# Patient Record
Sex: Male | Born: 1935
Health system: Southern US, Community
[De-identification: ages and names within clinical notes are randomized; demographics above are authoritative.]

## PROBLEM LIST (undated history)

## (undated) DIAGNOSIS — C439 Malignant melanoma of skin, unspecified: Secondary | ICD-10-CM

## (undated) DIAGNOSIS — I509 Heart failure, unspecified: Secondary | ICD-10-CM

## (undated) DIAGNOSIS — I10 Essential (primary) hypertension: Secondary | ICD-10-CM

## (undated) DIAGNOSIS — A809 Acute poliomyelitis, unspecified: Secondary | ICD-10-CM

## (undated) DIAGNOSIS — D126 Benign neoplasm of colon, unspecified: Secondary | ICD-10-CM

## (undated) DIAGNOSIS — G14 Postpolio syndrome: Secondary | ICD-10-CM

## (undated) DIAGNOSIS — K219 Gastro-esophageal reflux disease without esophagitis: Secondary | ICD-10-CM

## (undated) DIAGNOSIS — I2692 Saddle embolus of pulmonary artery without acute cor pulmonale: Secondary | ICD-10-CM

## (undated) DIAGNOSIS — C61 Malignant neoplasm of prostate: Secondary | ICD-10-CM

## (undated) DIAGNOSIS — N189 Chronic kidney disease, unspecified: Secondary | ICD-10-CM

## (undated) DIAGNOSIS — M1990 Unspecified osteoarthritis, unspecified site: Secondary | ICD-10-CM

## (undated) DIAGNOSIS — E785 Hyperlipidemia, unspecified: Secondary | ICD-10-CM

## (undated) HISTORY — DX: Benign neoplasm of colon, unspecified: D12.6

## (undated) HISTORY — DX: Acute poliomyelitis, unspecified: A80.9

## (undated) HISTORY — PX: LUMBAR EPIDURAL INJECTION: SHX1980

## (undated) HISTORY — PX: OTHER SURGICAL HISTORY: SHX169

## (undated) HISTORY — DX: Hyperlipidemia, unspecified: E78.5

## (undated) HISTORY — DX: Malignant neoplasm of prostate: C61

## (undated) HISTORY — DX: Postpolio syndrome: G14

## (undated) HISTORY — PX: PROSTATECTOMY: SHX69

## (undated) HISTORY — PX: TONSILLECTOMY: SUR1361

---
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
## 1998-03-15 ENCOUNTER — Ambulatory Visit (HOSPITAL_COMMUNITY): Admission: RE | Admit: 1998-03-15 | Discharge: 1998-03-15 | Payer: Self-pay | Admitting: Family Medicine

## 2000-12-14 ENCOUNTER — Other Ambulatory Visit: Admission: RE | Admit: 2000-12-14 | Discharge: 2000-12-14 | Payer: Self-pay | Admitting: Internal Medicine

## 2000-12-14 ENCOUNTER — Encounter (INDEPENDENT_AMBULATORY_CARE_PROVIDER_SITE_OTHER): Payer: Self-pay | Admitting: Specialist

## 2000-12-24 ENCOUNTER — Encounter: Payer: Self-pay | Admitting: General Surgery

## 2000-12-24 ENCOUNTER — Ambulatory Visit (HOSPITAL_COMMUNITY): Admission: RE | Admit: 2000-12-24 | Discharge: 2000-12-24 | Payer: Self-pay | Admitting: General Surgery

## 2001-01-07 ENCOUNTER — Encounter (INDEPENDENT_AMBULATORY_CARE_PROVIDER_SITE_OTHER): Payer: Self-pay | Admitting: Specialist

## 2001-01-07 ENCOUNTER — Inpatient Hospital Stay (HOSPITAL_COMMUNITY): Admission: RE | Admit: 2001-01-07 | Discharge: 2001-01-11 | Payer: Self-pay | Admitting: General Surgery

## 2002-12-15 ENCOUNTER — Ambulatory Visit (HOSPITAL_COMMUNITY): Admission: RE | Admit: 2002-12-15 | Discharge: 2002-12-15 | Payer: Self-pay | Admitting: Urology

## 2003-03-27 ENCOUNTER — Ambulatory Visit: Admission: RE | Admit: 2003-03-27 | Discharge: 2003-05-30 | Payer: Self-pay | Admitting: Radiation Oncology

## 2003-06-28 ENCOUNTER — Ambulatory Visit: Admission: RE | Admit: 2003-06-28 | Discharge: 2003-06-28 | Payer: Self-pay | Admitting: Radiation Oncology

## 2003-10-26 ENCOUNTER — Encounter: Admission: RE | Admit: 2003-10-26 | Discharge: 2004-01-24 | Payer: Self-pay | Admitting: Internal Medicine

## 2007-11-22 ENCOUNTER — Encounter: Admission: RE | Admit: 2007-11-22 | Discharge: 2008-02-16 | Payer: Self-pay | Admitting: Internal Medicine

## 2007-12-15 ENCOUNTER — Ambulatory Visit: Payer: Self-pay | Admitting: Internal Medicine

## 2008-01-04 ENCOUNTER — Ambulatory Visit: Payer: Self-pay | Admitting: Internal Medicine

## 2008-01-04 ENCOUNTER — Encounter: Payer: Self-pay | Admitting: Internal Medicine

## 2008-01-06 ENCOUNTER — Encounter: Payer: Self-pay | Admitting: Internal Medicine

## 2009-03-18 ENCOUNTER — Encounter: Payer: Self-pay | Admitting: Cardiology

## 2010-03-20 NOTE — Procedures (Signed)
Summary: Colonoscopy   Colonoscopy  Procedure date:  01/04/2008  Findings:      Location:  [facility].    Procedures Next Due Date:    Colonoscopy: 01/2011  Patient Name: Eddie Sanders, Eddie Sanders. MRN:  Procedure Procedures: Colonoscopy CPT: [phone].    with polypectomy. CPT: [id number].  Personnel: Endoscopist: Wilhemina Bonito. Marina Goodell, MD.  Exam Location: Exam performed in Outpatient Clinic. Outpatient  Patient Consent: Procedure, Alternatives, Risks and Benefits discussed, consent obtained, from patient. Consent was obtained by the RN.  Indications  Surveillance of: Adenomatous Polyp(s). This is not an initial surveillance exam. Initial polypectomy was performed in 2002. Largest polyp removed was > 19 mm. Pathology of worst  polyp: tubulovillous adenoma. The patient has had surgery. Previous surveillance exam(s) in  2003, Previous surveillance exam(s) in  2005,  History  Current Medications: Patient is not currently taking Coumadin.  Pre-Exam Physical: Performed Jan 04, 2008. Cardio-pulmonary exam, Rectal exam, HEENT exam , Mental status exam WNL.  Comments: Pt. history reviewed/updated, physical exam performed prior to initiation of sedation?yes Exam Exam: Extent of exam reached: Cecum, extent intended: Cecum.  The cecum was identified by appendiceal orifice and IC valve. Patient position: on left side. Time to Cecum: 00:03:48. Time for Withdrawl: 00:13:49. Colon retroflexion performed. Images taken. ASA Classification: II. Tolerance: excellent.  Monitoring: Pulse and BP monitoring, Oximetry used. Supplemental O2 given.  Colon Prep Used Movi  for colon prep. Prep results: excellent.  Sedation Meds: Patient assessed and found to be appropriate for moderate (conscious) sedation. Fentanyl 25 mcg. given IV. Versed 4 mg. given IV.  Findings MULTIPLE POLYPS: Cecum to Transverse Colon. minimum size 2 mm, maximum size 5 mm. Procedure:  snare without cautery, removed,  Polyp retrieved, 5 polyps Polyps sent to pathology. ICD9: Colon Polyps: 211.3.  NORMAL EXAM: Cecum.  PRIOR SURGERY: Sigmoid Colon. Segmental Colectomy.  NORMAL EXAM: Rectum.   Assessment  Diagnoses: 211.3: Colon Polyps.  x 5.   Events  Unplanned Interventions: No intervention was required.  Unplanned Events: There were no complications. Plans Disposition: After procedure patient sent to recovery. After recovery patient sent home.  Scheduling/Referral: Colonoscopy, to Wilhemina Bonito. Marina Goodell, MD, in 3 years if medically fit,    cc: Rodrigo Ran, MD   REPORT OF SURGICAL PATHOLOGY   Case #: [id number] Patient Name: Eddie Sanders, Eddie Sanders. Office Chart Number:  [id number]   MRN: [id number] Pathologist: Beulah Gandy. Luisa Hart, MD DOB/Age  [birth year] (Age: 77)    Gender: M Date Taken:  01/04/2008 Date Received: 01/04/2008   FINAL DIAGNOSIS   ***MICROSCOPIC EXAMINATION AND DIAGNOSIS***   COLON, CECUM, ASCENDING AND TRANSVERSE POLYPS:  THREE TUBULAR ADENOMAS AND FRAGMENTS OF BENIGN COLONIC MUCOSA.  NO HIGH GRADE DYSPLASIA OR MALIGNANCY IDENTIFIED.   cc Date Reported:  01/05/2008     Beulah Gandy. Luisa Hart, MD   January 06, 2008 MRN: [id number]    [facility] [address] [place], [place]  [address]    Dear Eddie Sanders,  I am pleased to inform you that the colon polyp(s) removed during your recent colonoscopy was (were) found to be benign (no cancer detected) upon pathologic examination.  I recommend you have a repeat colonoscopy examination in 3 years to look for recurrent polyps, as having colon polyps increases your risk for having recurrent polyps or even colon cancer in the future.  Should you develop new or worsening symptoms of abdominal pain, bowel habit changes or bleeding from the rectum or bowels, please schedule an evaluation with either your primary  care physician or with me.  Additional information/recommendations:  _X_ No further action with gastroenterology is needed  at this time. Please      follow-up with your primary care physician for your other healthcare      needs.  Please call us if you are having persistent problems or have questions about your condition that have not been fully answered at this time.  Sincerely,  Hilarie Fredrickson MD  This report was created from the original endoscopy report, which was reviewed and signed by the above listed endoscopist.

## 2010-03-20 NOTE — Letter (Signed)
Summary: Patient Notice- Polyp Results  [facility]  [address] [place], [place] [address]   Phone: [phone]  Fax: [phone]        January 06, 2008 MRN: [id number]    [facility] [address] [place], [place]  [address]    Dear Eddie Sanders,  I am pleased to inform you that the colon polyp(s) removed during your recent colonoscopy was (were) found to be benign (no cancer detected) upon pathologic examination.  I recommend you have a repeat colonoscopy examination in 3 years to look for recurrent polyps, as having colon polyps increases your risk for having recurrent polyps or even colon cancer in the future.  Should you develop new or worsening symptoms of abdominal pain, bowel habit changes or bleeding from the rectum or bowels, please schedule an evaluation with either your primary care physician or with me.  Additional information/recommendations:  _X_ No further action with gastroenterology is needed at this time. Please      follow-up with your primary care physician for your other healthcare      needs.  Please call us if you are having persistent problems or have questions about your condition that have not been fully answered at this time.  Sincerely,  Hilarie Fredrickson MD  This letter has been electronically signed by your physician.

## 2010-03-20 NOTE — Letter (Signed)
Summary: [facility] No Show Notification  [facility] No Show Notification   Imported By: Roderic Ovens 04/02/2009 15:11:36  _____________________________________________________________________  External Attachment:    Type:   Image     Comment:   External Document

## 2010-03-20 NOTE — Miscellaneous (Signed)
Summary: GI PV  Clinical Lists Changes  Medications: Added new medication of MOVIPREP 100 GM  SOLR (PEG-KCL-NACL-NASULF-NA ASC-C) As per prep instructions. - Signed Rx of MOVIPREP 100 GM  SOLR (PEG-KCL-NACL-NASULF-NA ASC-C) As per prep instructions.;  #1 x 0;  Signed;  Entered by: Barton Fanny RN;  Authorized by: Hilarie Fredrickson MD;  Method used: Electronically to [facility]. #[id number]*, [address], [place], [place], [place]  [address], Ph: [phone], Fax: [phone] Allergies: Added new allergy or adverse reaction of PENICILLIN Observations: Added new observation of ALLERGY REV: Done (12/15/2007 14:08) Added new observation of NKA: F (12/15/2007 14:08)    Prescriptions: MOVIPREP 100 GM  SOLR (PEG-KCL-NACL-NASULF-NA ASC-C) As per prep instructions.  #1 x 0   Entered by:   Barton Fanny RN   Authorized by:   Hilarie Fredrickson MD   Signed by:   Barton Fanny RN on 12/15/2007   Method used:   Electronically to        [facility]. [phone]* (retail)       [address]       [place], [place]  [address]       Ph: [phone]       Fax: [phone]   RxID:   [id number]

## 2010-07-01 ENCOUNTER — Ambulatory Visit
Admission: RE | Admit: 2010-07-01 | Discharge: 2010-07-01 | Disposition: A | Discharge status: Home / Self Care | Payer: Medicare Other | Source: Ambulatory Visit | Attending: Internal Medicine | Admitting: Internal Medicine

## 2010-07-01 ENCOUNTER — Other Ambulatory Visit: Payer: Self-pay | Admitting: Internal Medicine

## 2010-07-01 DIAGNOSIS — M1990 Unspecified osteoarthritis, unspecified site: Secondary | ICD-10-CM

## 2010-07-01 DIAGNOSIS — M545 Low back pain, unspecified: Secondary | ICD-10-CM

## 2010-07-01 DIAGNOSIS — IMO0002 Reserved for concepts with insufficient information to code with codable children: Secondary | ICD-10-CM

## 2010-07-04 NOTE — Discharge Summary (Signed)
[facility]  Patient:    BRADSHAW, MINIHAN Visit Number: [id number] MRN: [id number]          Service Type: SUR Location: 3W 4782 01 Attending Physician:  Carson Myrtle Dictated by:   Sheppard Plumber Earlene Plater, M.D. Admit Date:  01/07/2001 Discharge Date: 01/11/2001   CC:         Wilhemina Bonito. Eda Keys., M.D. [facility]  Rodrigo Ran, M.D.   Discharge Summary  FINAL DIAGNOSIS:  Tubulovillous adenoma, left colon.  HISTORY OF PRESENT ILLNESS:  The patient was seen and evaluated as an outpatient.  See the enclosed notes.  He was known to have a nonresectable endoscopic tumor of the left colon.  He was prepared as an outpatient. Laboratory data included normal CBC, normal chemistry profile, normal CEA level and brought into the hospital on the day of surgery, November 22. Underwent biopsy of an omental node and sigmoid colectomy.  He had a smooth, uneventful, uncomplicated recovery and was discharged postop day #4. Instructions were given including pain medication, multivitamins, acidophilus if needed, with restrictions.  Final pathology report confirmed benign tubulovillous adenoma.  No evidence of carcinoma.  A biopsy of the omental node also was benign.  He will be seen and followed as an outpatient. Dictated by:   Sheppard Plumber Earlene Plater, M.D. Attending Physician:  Carson Myrtle DD:  01/24/01 TD:  01/24/01 Job: [phone]

## 2010-07-04 NOTE — Op Note (Signed)
[facility]  Patient:    Eddie Sanders, Eddie Sanders Visit Number: [id number] MRN: [id number]          Service Type: SUR Location: 3W 4782 01 Attending Physician:  Carson Myrtle Dictated by:   Sheppard Plumber Earlene Plater, M.D. Proc. Date: 01/07/01 Admit Date:  01/07/2001   CC:         Jamison Neighbor, M.D.  Wilhemina Bonito. Eda Keys., M.D. [facility]  Rodrigo Ran, M.D.   Operative Report  PREOPERATIVE DIAGNOSIS:  Tubular adenoma with atypia sigmoid colon.  POSTOPERATIVE DIAGNOSIS:  Tubular adenoma with atypia sigmoid colon.  PROCEDURE:  Exploratory laparotomy, biopsy of omental mass, and sigmoid colectomy.  SURGEON:  Timothy E. Earlene Plater, M.D.  ASSISTANT:  Anselm Pancoast. Zachery Dakins, M.D.  ANESTHESIA:  CRNA supervised, Dr. Almeta Monas.  INDICATION:  Mr. Inabinet has been evaluated by Dr. Waynard Edwards and Dr. Marina Goodell and found to have a large tubular villous adenoma of the sigmoid colon with focal atypia.  He is now prepared and ready for colectomy.  This has been carefully explained.  He does have a history of polio as a child and prostate cancer, Dr. Logan Bores.  DESCRIPTION OF PROCEDURE:  He was prepared at home, brought in, evaluated by anesthesia, fluid balance replenished, taken to the operating room, placed supine, general endotracheal anesthesia administered.  PAS hose, Foley catheter, nasogastric tube installed.  The abdomen was shaved, scrubbed, prepped and draped in the usual fashion.  A short midline incision was used, and the peritoneum was entered.  The overall appearance was normal.  Careful palpation of the upper abdominal viscera was within normal limits.  The bowel appeared normal grossly.  The tumor was palpable at the apex of the sigmoid colon as a soft attached but movable apparent polyp.  Evaluation of the pelvis revealed some nodularity on the right rim of the pelvis.  There appeared to be some blue stain in this area.  We thought this might be related to his previous  surgery for prostate cancer.  There were two small nodules in the fatty tissue on the anterior abdominal wall along the urachus.  We did remove those two nodules; each was about 1 cm.  They were sent for frozen section. We then packed the bowel in the upper abdomen.  We evaluated the colon and the colon mass and found that we could easily resect the sigmoid loop and then anastomose the distal descending to the upper rectum.  This was accomplished by dividing the bowel between clamps, then dividing the mesentery between clamps, and then carefully sewing or tying the mesentery vessels.  The mesentery was dry.  The bowel easily was brought together.  There was no tension, and an open hand-sewn anastomosis was created with 3-0 interrupted silks to complete the anastomosis.  This appeared to be intact and airtight. It lay nicely over the pelvis, again, with no tension.  The mesentery was reapproximated.  Copious irrigation was carried out.  The pathologist had called about the two nodules from frozen section.  No carcinoma was identified.  Further permanent pathology to follow.  I did open the colon on the back table, and there was a large frond-like polyp attached to the anterior wall of the sigmoid colon.  There were no other lesions.  This was submitted fresh to pathology.  The sponge and sharp and instruments counts were correct and with the bowel re-placed into the mid abdomen, the abdomen was closed in a single layer with #1 PDS suture.  The  subcu copiously irrigated, and the skin closed with wide skin staples.  Second count was correct.  He tolerated it well, was awakened, and taken to the recovery room in good condition. Dictated by:   Sheppard Plumber Earlene Plater, M.D. Attending Physician:  Carson Myrtle DD:  01/07/01 TD:  01/08/01 Job: [phone] [id number]

## 2010-12-09 ENCOUNTER — Encounter: Payer: Self-pay | Admitting: Internal Medicine

## 2011-01-20 ENCOUNTER — Ambulatory Visit (INDEPENDENT_AMBULATORY_CARE_PROVIDER_SITE_OTHER): Payer: Medicare Other | Admitting: Internal Medicine

## 2011-01-20 ENCOUNTER — Encounter: Payer: Self-pay | Admitting: Internal Medicine

## 2011-01-20 VITALS — BP 124/58 | HR 88 | Ht 71.5 in | Wt 190.0 lb

## 2011-01-20 DIAGNOSIS — Z860100 Personal history of colon polyps, unspecified: Secondary | ICD-10-CM

## 2011-01-20 NOTE — Progress Notes (Signed)
HISTORY OF PRESENT ILLNESS:  Eddie Sanders is a 77 y.o. male with hyperlipidemia, prostate cancer status post radical prostatectomy, and sigmoid colectomy for large villous mass. I have seen him for a surveillance colonoscopy. His last examination was performed in November of 2009. He was found to have 5 colon polyps which were removed. Followup in 3 years recommended. Patient has enjoyed excellent health in the interim. He remains quite active. His prostate cancer remains in remission. He is interested in surveillance colonoscopy.  REVIEW OF SYSTEMS:  All non-GI ROS negative.  Past Medical History  Diagnosis Date  . Adenomatous colon polyp   . Prostate cancer   . Allergic rhinitis   . Polio 1939  . Post-polio syndrome   . Hyperlipidemia     Past Surgical History  Procedure Date  . Prostatectomy   . Sigmoid colectomy   . Cataract surgery     bilateral  . Tonsillectomy     Social History Eddie Sanders  reports that he has been smoking Cigars.  He does not have any smokeless tobacco history on file. He reports that he does not drink alcohol or use illicit drugs.  family history includes Heart failure in his mother and Pancreatic cancer in his father.  There is no history of Colon cancer.  Allergies  Allergen Reactions  . Penicillins     REACTION: hives, rash, swelling       PHYSICAL EXAMINATION: Vital signs: BP 124/58  Pulse 88  Ht 5' 11.5" (1.816 m)  Wt 86.183 kg (190 lb)  BMI 26.13 kg/m2 General: Well-developed, well-nourished, no acute distress HEENT: Sclerae are anicteric, conjunctiva pink. Oral mucosa intact Lungs: Clear Heart: Regular Abdomen: soft, nontender, nondistended, no obvious ascites, no peritoneal signs, normal bowel sounds. No organomegaly. Extremities: No edema Psychiatric: alert and oriented x3. Cooperative    ASSESSMENT:  #67. 77 year old gentleman with history of villous adenomatous mass of the sigmoid colon requiring resection.  Multiple subsequent surveillance colonoscopies with polyps. Currently due for surveillance. Despite his age, he is in excellent health and is interested in surveillance colonoscopy.The nature of the procedure, as well as the risks, benefits, and alternatives were carefully and thoroughly reviewed with the patient. Ample time for discussion and questions allowed. The patient understood, was satisfied, and agreed to proceed. Movi prep prescribed. Patient instructed on its use

## 2011-01-20 NOTE — Patient Instructions (Signed)
You have been scheduled for a colonoscopy. Please follow written instructions given to you at your visit today.  Please pick up your prep kit at the pharmacy within the next 2-3 days. 

## 2011-01-22 ENCOUNTER — Encounter: Payer: Self-pay | Admitting: Internal Medicine

## 2011-01-23 ENCOUNTER — Other Ambulatory Visit: Payer: Self-pay | Admitting: Internal Medicine

## 2011-01-23 ENCOUNTER — Telehealth: Payer: Self-pay | Admitting: Internal Medicine

## 2011-01-23 MED ORDER — PEG-KCL-NACL-NASULF-NA ASC-C 100 G PO SOLR: 1.0000 | Freq: Once | ORAL | Status: DC

## 2011-01-23 NOTE — Telephone Encounter (Signed)
Resent moviprep to pharmacy

## 2011-01-27 ENCOUNTER — Ambulatory Visit (AMBULATORY_SURGERY_CENTER): Payer: Medicare Other | Admitting: Internal Medicine

## 2011-01-27 ENCOUNTER — Encounter: Payer: Self-pay | Admitting: Internal Medicine

## 2011-01-27 VITALS — BP 134/83 | HR 93 | Temp 97.7°F | Resp 16 | Ht 71.0 in | Wt 190.0 lb

## 2011-01-27 DIAGNOSIS — D126 Benign neoplasm of colon, unspecified: Secondary | ICD-10-CM

## 2011-01-27 DIAGNOSIS — D129 Benign neoplasm of anus and anal canal: Secondary | ICD-10-CM

## 2011-01-27 DIAGNOSIS — Z86010 Personal history of colonic polyps: Secondary | ICD-10-CM

## 2011-01-27 DIAGNOSIS — D128 Benign neoplasm of rectum: Secondary | ICD-10-CM

## 2011-01-27 DIAGNOSIS — Z1211 Encounter for screening for malignant neoplasm of colon: Secondary | ICD-10-CM

## 2011-01-27 MED ORDER — SODIUM CHLORIDE 0.9 % IV SOLN: 500.0000 mL | INTRAVENOUS | Status: DC

## 2011-01-27 NOTE — Progress Notes (Signed)
Patient did not experience any of the following events: a burn prior to discharge; a fall within the facility; wrong site/side/patient/procedure/implant event; or a hospital transfer or hospital admission upon discharge from the facility. (G8907) Patient did not have preoperative order for IV antibiotic SSI prophylaxis. (G8918)  

## 2011-01-27 NOTE — Op Note (Signed)
[facility] [address] [place], [place]  [address]  COLONOSCOPY PROCEDURE REPORT  PATIENT:  Eddie Sanders, Eddie Sanders  MR#:  [id number] BIRTHDATE:  1928/08/14, 82 yrs. old  GENDER:  male ENDOSCOPIST:  Wilhemina Bonito. Eda Keys, MD REF. BY:  Surveillance Program Recall, PROCEDURE DATE:  01/27/2011 PROCEDURE:  Colonoscopy with snare polypectomy x 2 ASA CLASS:  Class II INDICATIONS:  history of pre-cancerous (adenomatous) colon polyps, surveillance and high-risk screening ; remote resection villous mass sigmoid colon 2002 w/ multiple f/u exams (last11-2009 w/ multiple polyps) MEDICATIONS:   Fentanyl 25 mcg IV, Versed 3 mg IV, These medications were titrated to patient response per physician's verbal order  DESCRIPTION OF PROCEDURE:   After the risks benefits and alternatives of the procedure were thoroughly explained, informed consent was obtained.  Digital rectal exam was performed and revealed no abnormalities.   The LB [id number] endoscope was introduced through the anus and advanced to the cecum, which was identified by both the appendix and ileocecal valve, without limitations.  The quality of the prep was excellent, using MoviPrep.  The instrument was then slowly withdrawn as the colon was fully examined. <<PROCEDUREIMAGES>>  FINDINGS:  Two 5mm polyps were found in the rectum and sigmoid colon respectively. Polyps were snared without cautery. Retrieval was successful. There was a healthy surgical anastomosis in the sigmoid colon.  Otherwise normal colonoscopy without other polyps, masses, vascular ectasias, or inflammatory changes.  Retroflexed views in the rectum revealed internal hemorrhoids and anal papillae. The time to cecum = 4  minutes. The scope was then withdrawn in 11  minutes from the cecum and the procedure completed.  COMPLICATIONS:  None  ENDOSCOPIC IMPRESSION: 1) Two polyps, in the rectum and sigmoid colon, removed 2) Anastomosis in the sigmoid  colon 3) Otherwise normal colonoscopy 4) Internal hemorrhoids  RECOMMENDATIONS: 1) Return to the care of your primary provider. GI follow up as needed  ______________________________ Wilhemina Bonito. Eda Keys, MD  CC:  Rodrigo Ran, MD;  The Patient  n. eSIGNED:   Wilhemina Bonito. Eda Keys at 01/27/2011 10:57 AM  Bobetta Lime, [phone]

## 2011-01-28 ENCOUNTER — Telehealth: Payer: Self-pay | Admitting: *Deleted

## 2011-01-28 NOTE — Telephone Encounter (Signed)

## 2011-05-12 ENCOUNTER — Other Ambulatory Visit: Payer: Self-pay | Admitting: Dermatology

## 2011-06-26 ENCOUNTER — Other Ambulatory Visit: Payer: Self-pay | Admitting: Dermatology

## 2012-12-06 ENCOUNTER — Other Ambulatory Visit: Payer: Self-pay | Admitting: Dermatology

## 2013-05-22 ENCOUNTER — Other Ambulatory Visit: Payer: Self-pay | Admitting: Dermatology

## 2013-06-12 ENCOUNTER — Other Ambulatory Visit: Payer: Self-pay | Admitting: Dermatology

## 2013-07-14 ENCOUNTER — Other Ambulatory Visit: Payer: Self-pay | Admitting: Neurological Surgery

## 2013-07-17 ENCOUNTER — Encounter (HOSPITAL_COMMUNITY)
Admission: RE | Admit: 2013-07-17 | Discharge: 2013-07-17 | Disposition: A | Discharge status: Home / Self Care | Payer: Medicare Other | Source: Ambulatory Visit | Attending: Neurological Surgery | Admitting: Neurological Surgery

## 2013-07-17 ENCOUNTER — Encounter (HOSPITAL_COMMUNITY): Payer: Self-pay

## 2013-07-17 HISTORY — DX: Gastro-esophageal reflux disease without esophagitis: K21.9

## 2013-07-17 HISTORY — DX: Unspecified osteoarthritis, unspecified site: M19.90

## 2013-07-17 HISTORY — DX: Malignant melanoma of skin, unspecified: C43.9

## 2013-07-17 LAB — PROTIME-INR
INR: 1.04 (ref 0.00–1.49)
Prothrombin Time: 13.4 seconds (ref 11.6–15.2)

## 2013-07-17 LAB — CBC WITH DIFFERENTIAL/PLATELET
Basophils Absolute: 0 10*3/uL (ref 0.0–0.1)
Basophils Relative: 1 % (ref 0–1)
Eosinophils Absolute: 0.2 10*3/uL (ref 0.0–0.7)
Eosinophils Relative: 3 % (ref 0–5)
HCT: 42 % (ref 39.0–52.0)
Hemoglobin: 14 g/dL (ref 13.0–17.0)
Lymphocytes Relative: 19 % (ref 12–46)
Lymphs Abs: 1.3 10*3/uL (ref 0.7–4.0)
MCH: 30.6 pg (ref 26.0–34.0)
MCHC: 33.3 g/dL (ref 30.0–36.0)
MCV: 91.9 fL (ref 78.0–100.0)
Monocytes Absolute: 0.4 10*3/uL (ref 0.1–1.0)
Monocytes Relative: 6 % (ref 3–12)
Neutro Abs: 4.7 10*3/uL (ref 1.7–7.7)
Neutrophils Relative %: 71 % (ref 43–77)
Platelets: 196 10*3/uL (ref 150–400)
RBC: 4.57 MIL/uL (ref 4.22–5.81)
RDW: 12.7 % (ref 11.5–15.5)
WBC: 6.6 10*3/uL (ref 4.0–10.5)

## 2013-07-17 LAB — BASIC METABOLIC PANEL
BUN: 20 mg/dL (ref 6–23)
CO2: 28 mEq/L (ref 19–32)
Calcium: 10 mg/dL (ref 8.4–10.5)
Chloride: 103 mEq/L (ref 96–112)
Creatinine, Ser: 0.9 mg/dL (ref 0.50–1.35)
GFR calc Af Amer: 89 mL/min — ABNORMAL LOW (ref >90)
GFR calc non Af Amer: 77 mL/min — ABNORMAL LOW (ref >90)
Glucose, Bld: 96 mg/dL (ref 70–99)
Potassium: 4.7 mEq/L (ref 3.7–5.3)
Sodium: 141 mEq/L (ref 137–147)

## 2013-07-17 LAB — SURGICAL PCR SCREEN
MRSA, PCR: NEGATIVE
Staphylococcus aureus: NEGATIVE

## 2013-07-17 NOTE — Progress Notes (Signed)
Pt denies SOB, chest pain, and being under the care of a cardiologist. Pt denies having a chest x ray, EKG in the last year and denies having a stress, echo, and cardiac cath. Spoke with Ebony Hail, [place] ( anesthesia) to make aware of pt concern for the amount of anesthesia used with a history of post polio syndrome.

## 2013-07-17 NOTE — Progress Notes (Signed)
07/17/13 1001  OBSTRUCTIVE SLEEP APNEA  Have you ever been diagnosed with sleep apnea through a sleep study? No  Do you snore loudly (loud enough to be heard through closed doors)?  0  Do you often feel tired, fatigued, or sleepy during the daytime? 1  Has anyone observed you stop breathing during your sleep? 0  Do you have, or are you being treated for high blood pressure? 0  BMI more than 35 kg/m2? 0  Age over 78 years old? 1  Neck circumference greater than 40 cm/16 inches? 1  Gender: 1  Obstructive Sleep Apnea Score 4

## 2013-07-17 NOTE — Pre-Procedure Instructions (Signed)
RANULFO KALL  07/17/2013   Your procedure is scheduled on: Wednesday, July 19, 2013  Report to [facility] Short Stay (use Main Entrance "A'') at 10:00 AM.  Call this number if you have problems the morning of surgery: [phone]   Remember:   Do not eat food or drink liquids after midnight.   Take these medicines the morning of surgery with A SIP OF WATER: None Stop taking Aspirin, vitamins and herbal medications. Do not take any NSAIDs ie: Ibuprofen, Advil, Naproxen or any medication containing Aspirin.  Do not wear jewelry, make-up or nail polish.  Do not wear lotions, powders, or perfumes. You may wear deodorant.  Do not shave 48 hours prior to surgery. Men may shave face and neck.  Do not bring valuables to the hospital.  [facility] is not responsible for any belongings or valuables.               Contacts, dentures or bridgework may not be worn into surgery.  Leave suitcase in the car. After surgery it may be brought to your room.  For patients admitted to the hospital, discharge time is determined by your treatment team.               Patients discharged the day of surgery will not be allowed to drive home.  Name and phone number of your driver:   Special Instructions:  Special Instructions:Special Instructions: [facility] - Preparing for Surgery  Before surgery, you can play an important role.  Because skin is not sterile, your skin needs to be as free of germs as possible.  You can reduce the number of germs on you skin by washing with CHG (chlorahexidine gluconate) soap before surgery.  CHG is an antiseptic cleaner which kills germs and bonds with the skin to continue killing germs even after washing.  Please DO NOT use if you have an allergy to CHG or antibacterial soaps.  If your skin becomes reddened/irritated stop using the CHG and inform your nurse when you arrive at Short Stay.  Do not shave (including legs and underarms) for at least 48 hours prior to the first CHG  shower.  You may shave your face.  Please follow these instructions carefully:   1.  Shower with CHG Soap the night before surgery and the morning of Surgery.  2.  If you choose to wash your hair, wash your hair first as usual with your normal shampoo.  3.  After you shampoo, rinse your hair and body thoroughly to remove the Shampoo.  4.  Use CHG as you would any other liquid soap.  You can apply chg directly  to the skin and wash gently with scrungie or a clean washcloth.  5.  Apply the CHG Soap to your body ONLY FROM THE NECK DOWN.  Do not use on open wounds or open sores.  Avoid contact with your eyes, ears, mouth and genitals (private parts).  Wash genitals (private parts) with your normal soap.  6.  Wash thoroughly, paying special attention to the area where your surgery will be performed.  7.  Thoroughly rinse your body with warm water from the neck down.  8.  DO NOT shower/wash with your normal soap after using and rinsing off the CHG Soap.  9.  Pat yourself dry with a clean towel.            10.  Wear clean pajamas.  11.  Place clean sheets on your bed the night of your first shower and do not sleep with pets.  Day of Surgery  Do not apply any lotions the morning of surgery.  Please wear clean clothes to the hospital/surgery center.   Please read over the following fact sheets that you were given: Pain Booklet, Coughing and Deep Breathing, MRSA Information and Surgical Site Infection Prevention

## 2013-07-18 MED ORDER — DEXAMETHASONE SODIUM PHOSPHATE 10 MG/ML IJ SOLN
10.0000 mg | INTRAMUSCULAR | Status: DC
  Filled 2013-07-18: qty 1

## 2013-07-18 MED ORDER — VANCOMYCIN HCL IN DEXTROSE 1-5 GM/200ML-% IV SOLN
1000.0000 mg | INTRAVENOUS | Status: AC
  Administered 2013-07-19: 1000 mg via INTRAVENOUS
  Filled 2013-07-18 (×2): qty 200

## 2013-07-18 NOTE — Progress Notes (Signed)
Pt called and stated that he had an "insect bite" on his leg and he went to see his dermatologist yesterday for it. States he was put on Doxycycline and wanted to know if it was ok if he took it today. I told him yes, but not to take day of surgery. He voiced understanding. He also asked if he should still use the CHG soap on the area, he asked if he should use Dial soap instead on just the reddened area. I told him that should be fine to do.

## 2013-07-18 NOTE — Progress Notes (Signed)
Anesthesia Chart Review:  Patient is a 80 year old male scheduled for L2-L3 4, L4-5 laminectomies on 07/19/13 by Dr. Ronnald Ramp.  History includes smoking, polio in 1939 with post polio syndrome, prostate cancer status post prostatectomy, hyperlipidemia, arthritis, right eye melanoma, adenomatous colon polyp with atypic s/p sigmoid colectomy.  OSA screening score was 4. PCP is Dr. Crist Infante. PAT RN reported patient with RLE weakness associated with history of polio.     EKG on 07/17/13 showed NSR, incomplete right BBB. There are no comparison EKGs in Muse or Epic.  Preoperative chest x-ray and labs noted.  Patient's PAT results appear acceptable for the OR.  He does have a history of post polio syndrome which will need to be taken into account from an anesthesia standpoint.  He will talk further with his assigned anesthesiologist on the day of surgery.  George Hugh [facility] Short Stay Center/Anesthesiology Phone [phone] 07/18/2013 11:36 AM

## 2013-07-18 NOTE — Anesthesia Preprocedure Evaluation (Addendum)
Anesthesia Evaluation  Patient identified by MRN, date of birth, ID band Patient awake    Reviewed: Allergy & Precautions, H&P , NPO status , Patient's Chart, lab work & pertinent test results  Airway Mallampati: II TM Distance: >3 FB Neck ROM: Full    Dental  (+) Teeth Intact, Dental Advisory Given   Pulmonary Current Smoker,  breath sounds clear to auscultation        Cardiovascular Rhythm:Regular Rate:Normal     Neuro/Psych Post polio syndrome    GI/Hepatic   Endo/Other    Renal/GU      Musculoskeletal   Abdominal   Peds  Hematology   Anesthesia Other Findings   Reproductive/Obstetrics                         Anesthesia Physical Anesthesia Plan  ASA: II  Anesthesia Plan: General   Post-op Pain Management:    Induction: Intravenous  Airway Management Planned: Oral ETT  Additional Equipment:   Intra-op Plan:   Post-operative Plan: Extubation in OR  Informed Consent:   Dental advisory given  Plan Discussed with: CRNA  Anesthesia Plan Comments: (Patient to discuss anesthesia considerations for post polio syndrome with his assigned anesthesiologist on the day of surgery.  Myra Gianotti, PA-C  )       Anesthesia Quick Evaluation

## 2013-07-19 ENCOUNTER — Encounter (HOSPITAL_COMMUNITY)
Admission: RE | Disposition: A | Discharge status: Home / Self Care | Payer: Self-pay | Source: Ambulatory Visit | Attending: Neurological Surgery

## 2013-07-19 ENCOUNTER — Inpatient Hospital Stay (HOSPITAL_COMMUNITY): Payer: Medicare Other

## 2013-07-19 ENCOUNTER — Inpatient Hospital Stay (HOSPITAL_COMMUNITY): Payer: Medicare Other | Admitting: Anesthesiology

## 2013-07-19 ENCOUNTER — Inpatient Hospital Stay (HOSPITAL_COMMUNITY)
Admission: RE | Admit: 2013-07-19 | Discharge: 2013-07-20 | DRG: 520 | Disposition: A | Discharge status: Home / Self Care | Payer: Medicare Other | Source: Ambulatory Visit | Attending: Neurological Surgery | Admitting: Neurological Surgery

## 2013-07-19 ENCOUNTER — Encounter (HOSPITAL_COMMUNITY): Payer: Self-pay | Admitting: *Deleted

## 2013-07-19 ENCOUNTER — Encounter (HOSPITAL_COMMUNITY): Payer: Medicare Other | Admitting: Vascular Surgery

## 2013-07-19 DIAGNOSIS — F17200 Nicotine dependence, unspecified, uncomplicated: Secondary | ICD-10-CM | POA: Diagnosis present

## 2013-07-19 DIAGNOSIS — Z01812 Encounter for preprocedural laboratory examination: Secondary | ICD-10-CM

## 2013-07-19 DIAGNOSIS — M48061 Spinal stenosis, lumbar region without neurogenic claudication: Principal | ICD-10-CM | POA: Diagnosis present

## 2013-07-19 DIAGNOSIS — Z01810 Encounter for preprocedural cardiovascular examination: Secondary | ICD-10-CM

## 2013-07-19 DIAGNOSIS — B91 Sequelae of poliomyelitis: Secondary | ICD-10-CM

## 2013-07-19 DIAGNOSIS — M129 Arthropathy, unspecified: Secondary | ICD-10-CM | POA: Diagnosis present

## 2013-07-19 DIAGNOSIS — Z8546 Personal history of malignant neoplasm of prostate: Secondary | ICD-10-CM

## 2013-07-19 DIAGNOSIS — Z01818 Encounter for other preprocedural examination: Secondary | ICD-10-CM

## 2013-07-19 DIAGNOSIS — E785 Hyperlipidemia, unspecified: Secondary | ICD-10-CM | POA: Diagnosis present

## 2013-07-19 DIAGNOSIS — Z9849 Cataract extraction status, unspecified eye: Secondary | ICD-10-CM

## 2013-07-19 DIAGNOSIS — Z79899 Other long term (current) drug therapy: Secondary | ICD-10-CM

## 2013-07-19 DIAGNOSIS — Z85820 Personal history of malignant melanoma of skin: Secondary | ICD-10-CM

## 2013-07-19 DIAGNOSIS — Z9889 Other specified postprocedural states: Secondary | ICD-10-CM

## 2013-07-19 DIAGNOSIS — K219 Gastro-esophageal reflux disease without esophagitis: Secondary | ICD-10-CM | POA: Diagnosis present

## 2013-07-19 HISTORY — PX: LUMBAR LAMINECTOMY/DECOMPRESSION MICRODISCECTOMY: SHX5026

## 2013-07-19 SURGERY — LUMBAR LAMINECTOMY/DECOMPRESSION MICRODISCECTOMY 2 LEVELS
Anesthesia: General | Site: Back | Laterality: Right

## 2013-07-19 MED ORDER — MORPHINE SULFATE 2 MG/ML IJ SOLN: 1.0000 mg | INTRAMUSCULAR | Status: DC | PRN

## 2013-07-19 MED ORDER — VANCOMYCIN HCL IN DEXTROSE 750-5 MG/150ML-% IV SOLN
750.0000 mg | Freq: Two times a day (BID) | INTRAVENOUS | Status: DC
  Administered 2013-07-20: 750 mg via INTRAVENOUS
  Filled 2013-07-19 (×2): qty 150

## 2013-07-19 MED ORDER — SODIUM CHLORIDE 0.9 % IV SOLN
250.0000 mL | INTRAVENOUS | Status: DC
  Administered 2013-07-19: 250 mL via INTRAVENOUS

## 2013-07-19 MED ORDER — GLYCOPYRROLATE 0.2 MG/ML IJ SOLN
INTRAMUSCULAR | Status: DC | PRN
  Administered 2013-07-19: .2 mg via INTRAVENOUS
  Administered 2013-07-19: .7 mg via INTRAVENOUS

## 2013-07-19 MED ORDER — ALBUMIN HUMAN 5 % IV SOLN
INTRAVENOUS | Status: DC | PRN
  Administered 2013-07-19: 15:00:00 via INTRAVENOUS

## 2013-07-19 MED ORDER — OXYCODONE-ACETAMINOPHEN 5-325 MG PO TABS: 1.0000 | ORAL_TABLET | ORAL | Status: DC | PRN

## 2013-07-19 MED ORDER — MENTHOL 3 MG MT LOZG
1.0000 | LOZENGE | OROMUCOSAL | Status: DC | PRN
  Filled 2013-07-19: qty 9

## 2013-07-19 MED ORDER — DEXAMETHASONE SODIUM PHOSPHATE 10 MG/ML IJ SOLN
INTRAMUSCULAR | Status: DC | PRN
  Administered 2013-07-19: 10 mg via INTRAVENOUS

## 2013-07-19 MED ORDER — LIDOCAINE HCL (CARDIAC) 20 MG/ML IV SOLN
INTRAVENOUS | Status: DC | PRN
  Administered 2013-07-19: 40 mg via INTRAVENOUS

## 2013-07-19 MED ORDER — ONDANSETRON HCL 4 MG/2ML IJ SOLN
INTRAMUSCULAR | Status: DC | PRN
  Administered 2013-07-19: 4 mg via INTRAVENOUS

## 2013-07-19 MED ORDER — ONDANSETRON HCL 4 MG/2ML IJ SOLN: 4.0000 mg | Freq: Once | INTRAMUSCULAR | Status: DC | PRN

## 2013-07-19 MED ORDER — OXYCODONE HCL 5 MG PO TABS: 5.0000 mg | ORAL_TABLET | Freq: Once | ORAL | Status: DC | PRN

## 2013-07-19 MED ORDER — EPHEDRINE SULFATE 50 MG/ML IJ SOLN
INTRAMUSCULAR | Status: AC
  Filled 2013-07-19: qty 1

## 2013-07-19 MED ORDER — PHENOL 1.4 % MT LIQD: 1.0000 | OROMUCOSAL | Status: DC | PRN

## 2013-07-19 MED ORDER — ONDANSETRON HCL 4 MG/2ML IJ SOLN
INTRAMUSCULAR | Status: AC
  Filled 2013-07-19: qty 2

## 2013-07-19 MED ORDER — EPHEDRINE SULFATE 50 MG/ML IJ SOLN
INTRAMUSCULAR | Status: DC | PRN
  Administered 2013-07-19: 10 mg via INTRAVENOUS

## 2013-07-19 MED ORDER — GLYCOPYRROLATE 0.2 MG/ML IJ SOLN
INTRAMUSCULAR | Status: AC
  Filled 2013-07-19: qty 3

## 2013-07-19 MED ORDER — SODIUM CHLORIDE 0.9 % IR SOLN
Status: DC | PRN
  Administered 2013-07-19: 14:00:00

## 2013-07-19 MED ORDER — FENTANYL CITRATE 0.05 MG/ML IJ SOLN
INTRAMUSCULAR | Status: DC | PRN
  Administered 2013-07-19: 100 ug via INTRAVENOUS
  Administered 2013-07-19: 50 ug via INTRAVENOUS
  Administered 2013-07-19: 100 ug via INTRAVENOUS

## 2013-07-19 MED ORDER — OXYCODONE HCL 5 MG/5ML PO SOLN: 5.0000 mg | Freq: Once | ORAL | Status: DC | PRN

## 2013-07-19 MED ORDER — SENNA 8.6 MG PO TABS
1.0000 | ORAL_TABLET | Freq: Two times a day (BID) | ORAL | Status: DC
  Administered 2013-07-19: 8.6 mg via ORAL
  Filled 2013-07-19 (×3): qty 1

## 2013-07-19 MED ORDER — SODIUM CHLORIDE 0.9 % IJ SOLN
3.0000 mL | Freq: Two times a day (BID) | INTRAMUSCULAR | Status: DC
  Administered 2013-07-19: 3 mL via INTRAVENOUS

## 2013-07-19 MED ORDER — PROPOFOL 10 MG/ML IV BOLUS
INTRAVENOUS | Status: DC | PRN
  Administered 2013-07-19: 170 mg via INTRAVENOUS

## 2013-07-19 MED ORDER — HEMOSTATIC AGENTS (NO CHARGE) OPTIME
TOPICAL | Status: DC | PRN
  Administered 2013-07-19: 1 via TOPICAL

## 2013-07-19 MED ORDER — HYDROMORPHONE HCL PF 1 MG/ML IJ SOLN
INTRAMUSCULAR | Status: AC
  Administered 2013-07-19: 0.5 mg via INTRAVENOUS
  Filled 2013-07-19: qty 1

## 2013-07-19 MED ORDER — ROCURONIUM BROMIDE 100 MG/10ML IV SOLN
INTRAVENOUS | Status: DC | PRN
  Administered 2013-07-19: 50 mg via INTRAVENOUS

## 2013-07-19 MED ORDER — ROCURONIUM BROMIDE 50 MG/5ML IV SOLN
INTRAVENOUS | Status: AC
  Filled 2013-07-19: qty 1

## 2013-07-19 MED ORDER — NEOSTIGMINE METHYLSULFATE 10 MG/10ML IV SOLN
INTRAVENOUS | Status: DC | PRN
  Administered 2013-07-19: 4 mg via INTRAVENOUS

## 2013-07-19 MED ORDER — PHENYLEPHRINE HCL 10 MG/ML IJ SOLN
INTRAMUSCULAR | Status: DC | PRN
  Administered 2013-07-19: 80 ug via INTRAVENOUS
  Administered 2013-07-19: 120 ug via INTRAVENOUS

## 2013-07-19 MED ORDER — BUPIVACAINE HCL (PF) 0.25 % IJ SOLN
INTRAMUSCULAR | Status: DC | PRN
  Administered 2013-07-19: 5 mL

## 2013-07-19 MED ORDER — FENTANYL CITRATE 0.05 MG/ML IJ SOLN
INTRAMUSCULAR | Status: AC
  Filled 2013-07-19: qty 5

## 2013-07-19 MED ORDER — POTASSIUM CHLORIDE IN NACL 20-0.9 MEQ/L-% IV SOLN
INTRAVENOUS | Status: DC
  Filled 2013-07-19 (×3): qty 1000

## 2013-07-19 MED ORDER — PROPOFOL 10 MG/ML IV BOLUS
INTRAVENOUS | Status: AC
  Filled 2013-07-19: qty 20

## 2013-07-19 MED ORDER — THROMBIN 5000 UNITS EX SOLR
OROMUCOSAL | Status: DC | PRN
  Administered 2013-07-19: 14:00:00 via TOPICAL

## 2013-07-19 MED ORDER — PHENYLEPHRINE 40 MCG/ML (10ML) SYRINGE FOR IV PUSH (FOR BLOOD PRESSURE SUPPORT)
PREFILLED_SYRINGE | INTRAVENOUS | Status: AC
  Filled 2013-07-19: qty 10

## 2013-07-19 MED ORDER — THROMBIN 5000 UNITS EX SOLR
CUTANEOUS | Status: DC | PRN
  Administered 2013-07-19 (×2): 5000 [IU] via TOPICAL

## 2013-07-19 MED ORDER — ACETAMINOPHEN 325 MG PO TABS
650.0000 mg | ORAL_TABLET | ORAL | Status: DC | PRN
  Administered 2013-07-19: 650 mg via ORAL
  Filled 2013-07-19: qty 2

## 2013-07-19 MED ORDER — SODIUM CHLORIDE 0.9 % IJ SOLN
INTRAMUSCULAR | Status: AC
  Filled 2013-07-19: qty 10

## 2013-07-19 MED ORDER — HYDROMORPHONE HCL PF 1 MG/ML IJ SOLN
0.2500 mg | INTRAMUSCULAR | Status: DC | PRN
  Administered 2013-07-19 (×2): 0.5 mg via INTRAVENOUS

## 2013-07-19 MED ORDER — ONDANSETRON HCL 4 MG/2ML IJ SOLN
4.0000 mg | INTRAMUSCULAR | Status: DC | PRN
Start: 2013-07-19 — End: 2013-07-20

## 2013-07-19 MED ORDER — SODIUM CHLORIDE 0.9 % IJ SOLN: 3.0000 mL | INTRAMUSCULAR | Status: DC | PRN

## 2013-07-19 MED ORDER — ACETAMINOPHEN 650 MG RE SUPP: 650.0000 mg | RECTAL | Status: DC | PRN

## 2013-07-19 MED ORDER — 0.9 % SODIUM CHLORIDE (POUR BTL) OPTIME
TOPICAL | Status: DC | PRN
  Administered 2013-07-19: 1000 mL

## 2013-07-19 MED ORDER — LACTATED RINGERS IV SOLN
INTRAVENOUS | Status: DC
  Administered 2013-07-19 (×2): via INTRAVENOUS

## 2013-07-19 SURGICAL SUPPLY — 46 items
BAG DECANTER FOR FLEXI CONT (MISCELLANEOUS) ×2 IMPLANT
BENZOIN TINCTURE PRP APPL 2/3 (GAUZE/BANDAGES/DRESSINGS) ×2 IMPLANT
BLADE 10 SAFETY STRL DISP (BLADE) ×2 IMPLANT
BUR MATCHSTICK NEURO 3.0 LAGG (BURR) ×2 IMPLANT
CANISTER SUCT 3000ML (MISCELLANEOUS) ×2 IMPLANT
CONT SPEC 4OZ CLIKSEAL STRL BL (MISCELLANEOUS) ×2 IMPLANT
DRAPE LAPAROTOMY 100X72X124 (DRAPES) ×2 IMPLANT
DRAPE MICROSCOPE ZEISS OPMI (DRAPES) ×2 IMPLANT
DRAPE POUCH INSTRU U-SHP 10X18 (DRAPES) ×2 IMPLANT
DRAPE SURG 17X23 STRL (DRAPES) ×2 IMPLANT
DRSG OPSITE 4X5.5 SM (GAUZE/BANDAGES/DRESSINGS) ×2 IMPLANT
DRSG OPSITE POSTOP 4X6 (GAUZE/BANDAGES/DRESSINGS) ×2 IMPLANT
DRSG TELFA 3X8 NADH (GAUZE/BANDAGES/DRESSINGS) ×2 IMPLANT
DURAPREP 26ML APPLICATOR (WOUND CARE) ×2 IMPLANT
ELECT REM PT RETURN 9FT ADLT (ELECTROSURGICAL) ×2
ELECTRODE REM PT RTRN 9FT ADLT (ELECTROSURGICAL) ×1 IMPLANT
GAUZE SPONGE 4X4 16PLY XRAY LF (GAUZE/BANDAGES/DRESSINGS) IMPLANT
GLOVE BIO SURGEON STRL SZ7 (GLOVE) ×6 IMPLANT
GLOVE BIO SURGEON STRL SZ8 (GLOVE) ×4 IMPLANT
GLOVE BIOGEL PI IND STRL 7.0 (GLOVE) ×2 IMPLANT
GLOVE BIOGEL PI INDICATOR 7.0 (GLOVE) ×2
GLOVE INDICATOR 8.5 STRL (GLOVE) ×2 IMPLANT
GOWN STRL REUS W/ TWL LRG LVL3 (GOWN DISPOSABLE) ×2 IMPLANT
GOWN STRL REUS W/ TWL XL LVL3 (GOWN DISPOSABLE) ×2 IMPLANT
GOWN STRL REUS W/TWL 2XL LVL3 (GOWN DISPOSABLE) IMPLANT
GOWN STRL REUS W/TWL LRG LVL3 (GOWN DISPOSABLE) ×2
GOWN STRL REUS W/TWL XL LVL3 (GOWN DISPOSABLE) ×2
HEMOSTAT POWDER KIT SURGIFOAM (HEMOSTASIS) IMPLANT
KIT BASIN OR (CUSTOM PROCEDURE TRAY) ×2 IMPLANT
KIT ROOM TURNOVER OR (KITS) ×2 IMPLANT
NEEDLE HYPO 25X1 1.5 SAFETY (NEEDLE) ×2 IMPLANT
NEEDLE SPNL 20GX3.5 QUINCKE YW (NEEDLE) IMPLANT
NS IRRIG 1000ML POUR BTL (IV SOLUTION) ×2 IMPLANT
PACK LAMINECTOMY NEURO (CUSTOM PROCEDURE TRAY) ×2 IMPLANT
PAD ARMBOARD 7.5X6 YLW CONV (MISCELLANEOUS) ×6 IMPLANT
RUBBERBAND STERILE (MISCELLANEOUS) ×4 IMPLANT
SPONGE SURGIFOAM ABS GEL SZ50 (HEMOSTASIS) ×2 IMPLANT
STRIP CLOSURE SKIN 1/2X4 (GAUZE/BANDAGES/DRESSINGS) ×2 IMPLANT
SUT VIC AB 0 CT1 18XCR BRD8 (SUTURE) ×1 IMPLANT
SUT VIC AB 0 CT1 8-18 (SUTURE) ×1
SUT VIC AB 2-0 CP2 18 (SUTURE) ×2 IMPLANT
SUT VIC AB 3-0 SH 8-18 (SUTURE) ×2 IMPLANT
SYR 20ML ECCENTRIC (SYRINGE) ×2 IMPLANT
TOWEL OR 17X24 6PK STRL BLUE (TOWEL DISPOSABLE) ×2 IMPLANT
TOWEL OR 17X26 10 PK STRL BLUE (TOWEL DISPOSABLE) ×2 IMPLANT
WATER STERILE IRR 1000ML POUR (IV SOLUTION) ×2 IMPLANT

## 2013-07-19 NOTE — Op Note (Signed)
07/19/2013  3:30 PM  PATIENT:  Eddie Sanders  80 y.o. male  PRE-OPERATIVE DIAGNOSIS:  Severe lumbar spinal stenosis L3-4 L4-5 with right leg pain  POST-OPERATIVE DIAGNOSIS:  Same  PROCEDURE:  Decompressive lumbar hemilaminectomy medial facetectomy and foraminotomies L3-4 and L4-5 on the right followed by sublaminar decompression  SURGEON:  Sherley Bounds, MD  ASSISTANTS: Dr. Saintclair Halsted  ANESTHESIA:   General  EBL: 100 ml  Total I/O In: 1200 [I.V.:1200] Out: 100 [Blood:100]  BLOOD ADMINISTERED:none  DRAINS: None   SPECIMEN:  No Specimen  INDICATION FOR PROCEDURE: This patient presented with a long history of bilateral foot drops. He had right leg pain. An MRI which showed severe stenosis at L3-4 and L4-5. He tried medical management without relief. Recommended decompressive surgery in the form of lumbar laminectomy. Patient understood the risks, benefits, and alternatives and potential outcomes and wished to proceed.  PROCEDURE DETAILS: The patient was taken to the operating room and after induction of adequate generalized endotracheal anesthesia, the patient was rolled into the prone position on the Wilson frame and all pressure points were padded. The lumbar region was cleaned and then prepped with DuraPrep and draped in the usual sterile fashion. 5 cc of local anesthesia was injected and then a dorsal midline incision was made and carried down to the lumbo sacral fascia. The fascia was opened and the paraspinous musculature was taken down in a subperiosteal fashion to expose L3-4 and L4-5 on the right. Intraoperative x-ray confirmed my level, and then I used a combination of the high-speed drill and the Kerrison punches to perform a hemilaminectomy, medial facetectomy, and foraminotomy at L3-4 and L4-5 on the right. The underlying yellow ligament was opened and removed in a piecemeal fashion to expose the underlying dura and exiting nerve root. I undercut the lateral recess and dissected  down until I was medial to and distal to the pedicle. He had significant overgrowth of his ligamentum flavum at both levels. The nerve root was well decompressed at both levels. I drilled up under the spinous process and the opposite lamina and used the Kerrison punches to perform a sublaminar decompression to decompress the central canal in the left lateral recess. I then palpated with a coronary dilator along the nerve roots and into the foramen to assure adequate decompression. I felt no more compression of the nerve root. I irrigated with saline solution containing bacitracin. Achieved hemostasis with bipolar cautery, lined the dura with Gelfoam, and then closed the fascia with 0 Vicryl. I closed the subcutaneous tissues with 2-0 Vicryl and the subcuticular tissues with 3-0 Vicryl. The skin was then closed with benzoin and Steri-Strips. The drapes were removed, a sterile dressing was applied. The patient was awakened from general anesthesia and transferred to the recovery room in stable condition. At the end of the procedure all sponge, needle and instrument counts were correct.   PLAN OF CARE: Admit to inpatient   PATIENT DISPOSITION:  PACU - hemodynamically stable.   Delay start of Pharmacological VTE agent (>24hrs) due to surgical blood loss or risk of bleeding:  yes

## 2013-07-19 NOTE — Anesthesia Procedure Notes (Signed)
Procedure Name: Intubation Date/Time: 07/19/2013 1:43 PM Performed by: Ignacia Bayley Pre-anesthesia Checklist: Patient identified, Timeout performed, Emergency Drugs available, Suction available and Patient being monitored Patient Re-evaluated:Patient Re-evaluated prior to inductionOxygen Delivery Method: Circle system utilized Preoxygenation: Pre-oxygenation with 100% oxygen Intubation Type: IV induction Ventilation: Mask ventilation without difficulty Laryngoscope Size: Mac and 4 Grade View: Grade I Tube type: Oral Number of attempts: 1 Airway Equipment and Method: Stylet Placement Confirmation: ETT inserted through vocal cords under direct vision,  breath sounds checked- equal and bilateral and positive ETCO2 Secured at: 22 cm Tube secured with: Tape Dental Injury: Teeth and Oropharynx as per pre-operative assessment

## 2013-07-19 NOTE — H&P (Signed)
Subjective: Patient is a 80 y.o. male admitted for decompressive laminectomy for lumbar spinal stenosis. Onset of symptoms was a few years ago, gradually worsening since that time.  The pain is rated severe, and is located at the across the lower back and radiates to legs right greater than left. The pain is described as aching and occurs intermittently. The symptoms have been progressive. Symptoms are exacerbated by exercise and standing. MRI or CT showed severe spinal stenosis L3-4 L4-5   Past Medical History  Diagnosis Date  . Adenomatous colon polyp   . Prostate cancer   . Allergic rhinitis   . Polio 1939  . Post-polio syndrome   . Hyperlipidemia   . Skin cancer (melanoma)     Right eye  . GERD (gastroesophageal reflux disease)   . Arthritis     Past Surgical History  Procedure Laterality Date  . Prostatectomy    . Sigmoid colectomy    . Cataract surgery      bilateral  . Tonsillectomy    . Lumbar epidural injection      Prior to Admission medications   Medication Sig Start Date End Date Taking? Authorizing Provider  Calcium Carb-Cholecalciferol (CALCIUM 600 + D PO) Take 1 tablet by mouth daily.   Yes Historical Provider, MD  cholecalciferol (VITAMIN D) 1000 UNITS tablet Take 1,000 Units by mouth daily.   Yes Historical Provider, MD  Coenzyme Q10 200 MG capsule Take 200 mg by mouth daily.   Yes Historical Provider, MD  cyanocobalamin 500 MCG tablet Take 500 mcg by mouth daily.   Yes Historical Provider, MD  doxycycline (VIBRA-TABS) 100 MG tablet Take 100 mg by mouth 2 (two) times daily.   Yes Historical Provider, MD  Multiple Vitamins-Minerals (CENTRUM SILVER ADULT 50+ PO) Take 1 tablet by mouth daily.   Yes Historical Provider, MD  simvastatin (ZOCOR) 20 MG tablet Take 20 mg by mouth 3 (three) times a week.     Yes Historical Provider, MD   Allergies  Allergen Reactions  . Penicillins     REACTION: hives, rash, swelling    History  Substance Use Topics  . Smoking  status: Current Some Day Smoker    Types: Cigars  . Smokeless tobacco: Never Used     Comment: has occasional cigar ;stopped cigarettes in 1965  . Alcohol Use: Yes     Comment: 2 oz scotch, 4 oz wine    Family History  Problem Relation Age of Onset  . Pancreatic cancer Father   . Heart failure Mother   . Colon cancer Neg Hx      Review of Systems  Positive ROS: neg  All other systems have been reviewed and were otherwise negative with the exception of those mentioned in the HPI and as above.  Objective: Vital signs in last 24 hours: Temp:  [97.7 F (36.5 C)] 97.7 F (36.5 C) (06/03 1006) Pulse Rate:  [93] 93 (06/03 1006) Resp:  [18] 18 (06/03 1006) BP: (171)/(69) 171/69 mmHg (06/03 1006) SpO2:  [99 %] 99 % (06/03 1006) Weight:  [79.379 kg (175 lb)] 79.379 kg (175 lb) (06/03 1006)  General Appearance: Alert, cooperative, no distress, appears stated age Head: Normocephalic, without obvious abnormality, atraumatic Eyes: PERRL, conjunctiva/corneas clear, EOM's intact    Neck: Supple, symmetrical, trachea midline Back: Symmetric, no curvature, ROM normal, no CVA tenderness Lungs:  respirations unlabored Heart: Regular rate and rhythm Abdomen: Soft, non-tender Extremities: Extremities normal, atraumatic, no cyanosis or edema Pulses: 2+ and symmetric all  extremities Skin: Skin color, texture, turgor normal, no rashes or lesions  NEUROLOGIC:   Mental status: Alert and oriented x4,  no aphasia, good attention span, fund of knowledge, and memory Motor Exam - grossly normal except for stable chronic bilateral foot drops right greater than left; some atrophy of right quadricep Sensory Exam - grossly normal Reflexes: 1+ Coordination - grossly normal Gait - bilateral foot drops Balance - not tested  Cranial Nerves: I: smell Not tested  II: visual acuity  OS: nl    OD: nl  II: visual fields Full to confrontation  II: pupils Equal, round, reactive to light  III,VII: ptosis  None  III,IV,VI: extraocular muscles  Full ROM  V: mastication Normal  V: facial light touch sensation  Normal  V,VII: corneal reflex  Present  VII: facial muscle function - upper  Normal  VII: facial muscle function - lower Normal  VIII: hearing Not tested  IX: soft palate elevation  Normal  IX,X: gag reflex Present  XI: trapezius strength  5/5  XI: sternocleidomastoid strength 5/5  XI: neck flexion strength  5/5  XII: tongue strength  Normal    Data Review Lab Results  Component Value Date   WBC 6.6 07/17/2013   HGB 14.0 07/17/2013   HCT 42.0 07/17/2013   MCV 91.9 07/17/2013   PLT 196 07/17/2013   Lab Results  Component Value Date   NA 141 07/17/2013   K 4.7 07/17/2013   CL 103 07/17/2013   CO2 28 07/17/2013   BUN 20 07/17/2013   CREATININE 0.90 07/17/2013   GLUCOSE 96 07/17/2013   Lab Results  Component Value Date   INR 1.04 07/17/2013    Assessment/Plan: Patient admitted for decompressive lumbar laminectomy for severe spinal stenosis L3-4 L4-5. Patient has failed a reasonable attempt at conservative therapy.  I explained the condition and procedure to the patient and answered any questions.  Patient wishes to proceed with procedure as planned. Understands risks/ benefits and typical outcomes of procedure.   Eddie Sanders 07/19/2013 1:04 PM

## 2013-07-19 NOTE — Transfer of Care (Signed)
Immediate Anesthesia Transfer of Care Note  Patient: Eddie Sanders  Procedure(s) Performed: Procedure(s): LUMBAR LAMINECTOMY/DECOMPRESSION MICRODISCECTOMY 2 LEVELS     lumbar  three/four,  four/five (Right)  Patient Location: PACU  Anesthesia Type:General  Level of Consciousness: awake, alert  and oriented  Airway & Oxygen Therapy: Patient Spontanous Breathing and Patient connected to nasal cannula oxygen  Post-op Assessment: Report given to PACU RN and Post -op Vital signs reviewed and stable  Post vital signs: Reviewed and stable  Complications: No apparent anesthesia complications

## 2013-07-19 NOTE — Anesthesia Postprocedure Evaluation (Signed)
  Anesthesia Post-op Note  Patient: Eddie Sanders  Procedure(s) Performed: Procedure(s): LUMBAR LAMINECTOMY/DECOMPRESSION MICRODISCECTOMY 2 LEVELS     lumbar  three/four,  four/five (Right)  Patient Location: PACU  Anesthesia Type:General  Level of Consciousness: awake, alert  and oriented  Airway and Oxygen Therapy: Patient Spontanous Breathing and Patient connected to nasal cannula oxygen   Post-op Pain: Mild  Post-op Assessment: Post-op Vital signs reviewed, Patient's Cardiovascular Status Stable, Respiratory Function Stable and Pain level controlled  Post-op Vital Signs: stable  Last Vitals:  Filed Vitals:   07/19/13 1700  BP: 142/65  Pulse: 65  Temp: 36.3 C  Resp: 16    Complications: No apparent anesthesia complications

## 2013-07-19 NOTE — Progress Notes (Signed)
ANTIBIOTIC CONSULT NOTE - INITIAL  Pharmacy Consult:  Vancomycin Indication:  Surgical prophylaxis  Allergies  Allergen Reactions  . Penicillins     REACTION: hives, rash, swelling    Patient Measurements: Height: 5' 10.87" (180 cm) Weight: 175 lb (79.379 kg) IBW/kg (Calculated) : 74.99  Vital Signs: Temp: 97.4 F (36.3 C) (06/03 1700) Temp src: Oral (06/03 1700) BP: 142/65 mmHg (06/03 1700) Pulse Rate: 65 (06/03 1700) Intake/Output from this shift: Total I/O In: 1850 [I.V.:1600; IV Piggyback:250] Out: 125 [Blood:125]  Labs:  Recent Labs  07/17/13 1035  WBC 6.6  HGB 14.0  PLT 196  CREATININE 0.90   Estimated Creatinine Clearance: 66 ml/min (by C-G formula based on Cr of 0.9). No results found for this basename: VANCOTROUGH, Eddie Sanders, VANCORANDOM, GENTTROUGH, GENTPEAK, GENTRANDOM, TOBRATROUGH, TOBRAPEAK, TOBRARND, AMIKACINPEAK, AMIKACINTROU, AMIKACIN,  in the last 72 hours   Microbiology: Recent Results (from the past 720 hour(s))  SURGICAL PCR SCREEN     Status: None   Collection Time    07/17/13 10:35 AM      Result Value Ref Range Status   MRSA, PCR NEGATIVE  NEGATIVE Final   Staphylococcus aureus NEGATIVE  NEGATIVE Final   Comment:            The Xpert SA Assay (FDA     approved for NASAL specimens     in patients over 68 years of age),     is one component of     a comprehensive surveillance     program.  Test performance has     been validated by [employer] for patients greater     than or equal to 78 year old.     It is not intended     to diagnose infection nor to     guide or monitor treatment.    Medical History: Past Medical History  Diagnosis Date  . Adenomatous colon polyp   . Prostate cancer   . Allergic rhinitis   . Polio 1939  . Post-polio syndrome   . Hyperlipidemia   . Skin cancer (melanoma)     Right eye  . GERD (gastroesophageal reflux disease)   . Arthritis      Assessment: [name] s/p spinal surgery to continue  vancomycin for surgical prophylaxis.  Baseline labs reviewed.  Aware patient received vancomycin 1gm IV around 1330 today.   Goal of Therapy:  Vancomycin trough level 10-15 mcg/ml   Plan:  - Vanc 750mg  IV Q12H - Monitor renal fxn, clinical course, vanc trough as indicated    Eddie Sanders, PharmD, BCPS Pager:  [phone] 07/19/2013, 5:31 PM

## 2013-07-20 MED ORDER — OXYCODONE-ACETAMINOPHEN 5-325 MG PO TABS: 1.0000 | ORAL_TABLET | ORAL | Status: DC | PRN

## 2013-07-20 NOTE — Progress Notes (Signed)
Pt. Alert and oriented,follows simple instructions, denies pain. Incision area without swelling, redness or S/S of infection. Voiding adequate clear yellow urine. Moving all extremities well and vitals stable and documented. Patient discharged home with spouse. Lumbar surgery notes instructions given to patient and family member for home safety and precautions. Pt. and family stated understanding of instructions given. Home equipment ordered and given to patient at discharged.

## 2013-07-20 NOTE — Discharge Summary (Signed)
Physician Discharge Summary  Patient ID: Eddie Sanders MRN: [id number] DOB/AGE: Mar 14, 1928 80 y.o.  Admit date: 07/19/2013 Discharge date: 07/20/2013  Admission Diagnoses: lumbar stenosis    Discharge Diagnoses: same   Discharged Condition: good  Hospital Course: The patient was admitted on 07/19/2013 and taken to the operating room where the patient underwent DLL L3-4, l4-5. The patient tolerated the procedure well and was taken to the recovery room and then to the floor in stable condition. The hospital course was routine. There were no complications. The wound remained clean dry and intact. Pt had appropriate back soreness (minimal). No complaints of leg pain or new N/T/W. The patient remained afebrile with stable vital signs, and tolerated a regular diet. The patient continued to increase activities, and pain was well controlled with oral pain medications.   Consults: None  Significant Diagnostic Studies:  Results for orders placed during the hospital encounter of 07/17/13  SURGICAL PCR SCREEN      Result Value Ref Range   MRSA, PCR NEGATIVE  NEGATIVE   Staphylococcus aureus NEGATIVE  NEGATIVE  BASIC METABOLIC PANEL      Result Value Ref Range   Sodium 141  137 - 147 mEq/L   Potassium 4.7  3.7 - 5.3 mEq/L   Chloride 103  96 - 112 mEq/L   CO2 28  19 - 32 mEq/L   Glucose, Bld 96  70 - 99 mg/dL   BUN 20  6 - 23 mg/dL   Creatinine, Ser 0.90  0.50 - 1.35 mg/dL   Calcium 10.0  8.4 - 10.5 mg/dL   GFR calc non Af Amer 77 (*) >90 mL/min   GFR calc Af Amer 89 (*) >90 mL/min  CBC WITH DIFFERENTIAL      Result Value Ref Range   WBC 6.6  4.0 - 10.5 K/uL   RBC 4.57  4.22 - 5.81 MIL/uL   Hemoglobin 14.0  13.0 - 17.0 g/dL   HCT 42.0  39.0 - 52.0 %   MCV 91.9  78.0 - 100.0 fL   MCH 30.6  26.0 - 34.0 pg   MCHC 33.3  30.0 - 36.0 g/dL   RDW 12.7  11.5 - 15.5 %   Platelets 196  150 - 400 K/uL   Neutrophils Relative % 71  43 - 77 %   Neutro Abs 4.7  1.7 - 7.7 K/uL   Lymphocytes  Relative 19  12 - 46 %   Lymphs Abs 1.3  0.7 - 4.0 K/uL   Monocytes Relative 6  3 - 12 %   Monocytes Absolute 0.4  0.1 - 1.0 K/uL   Eosinophils Relative 3  0 - 5 %   Eosinophils Absolute 0.2  0.0 - 0.7 K/uL   Basophils Relative 1  0 - 1 %   Basophils Absolute 0.0  0.0 - 0.1 K/uL  PROTIME-INR      Result Value Ref Range   Prothrombin Time 13.4  11.6 - 15.2 seconds   INR 1.04  0.00 - 1.49    Chest 2 View  07/17/2013   CLINICAL DATA:  Preop, tobacco use.  EXAM: CHEST  2 VIEW  COMPARISON:  None.  FINDINGS: The heart size and mediastinal contours are within normal limits. Both lungs are clear. The visualized skeletal structures are unremarkable.  IMPRESSION: No acute cardiopulmonary abnormality seen.   Electronically Signed   By: Sabino Dick M.D.   On: 07/17/2013 11:49   Dg Lumbar Spine 1 View  07/19/2013  CLINICAL DATA:  Intraoperative localization for lumbar spine surgery.  EXAM: LUMBAR SPINE - 1 VIEW  COMPARISON:  Lumbar spine MRI 04/26/2013  FINDINGS: There is a surgical instrument marking the L4-5 disc space level.  IMPRESSION: L4-5 marked intraoperatively.   Electronically Signed   By: Kalman Jewels M.D.   On: 07/19/2013 16:44    Antibiotics:  Anti-infectives   Start     Dose/Rate Route Frequency Ordered Stop   07/20/13 0200  vancomycin (VANCOCIN) IVPB 750 mg/150 ml premix     750 mg 150 mL/hr over 60 Minutes Intravenous Every 12 hours 07/19/13 1732     07/19/13 1426  bacitracin 50,000 Units in sodium chloride irrigation 0.9 % 500 mL irrigation  Status:  Discontinued       As needed 07/19/13 1427 07/19/13 1536   07/19/13 0600  vancomycin (VANCOCIN) IVPB 1000 mg/200 mL premix     1,000 mg 200 mL/hr over 60 Minutes Intravenous On call to O.R. 07/18/13 1409 07/19/13 1330      Discharge Exam: Blood pressure 91/50, pulse 72, temperature 99.2 F (37.3 C), temperature source Oral, resp. rate 20, height 5' 10.87" (1.8 m), weight 79.379 kg (175 lb), SpO2 95.00%. Neurologic: Grossly  normal, except for stable chronic B foot drop Incision CDI  Discharge Medications:     Medication List         CALCIUM 600 + D PO  Take 1 tablet by mouth daily.     CENTRUM SILVER ADULT 50+ PO  Take 1 tablet by mouth daily.     cholecalciferol 1000 UNITS tablet  Commonly known as:  VITAMIN D  Take 1,000 Units by mouth daily.     Coenzyme Q10 200 MG capsule  Take 200 mg by mouth daily.     cyanocobalamin 500 MCG tablet  Take 500 mcg by mouth daily.     doxycycline 100 MG tablet  Commonly known as:  VIBRA-TABS  Take 100 mg by mouth 2 (two) times daily.     oxyCODONE-acetaminophen 5-325 MG per tablet  Commonly known as:  PERCOCET/ROXICET  Take 1-2 tablets by mouth every 4 (four) hours as needed for moderate pain.     simvastatin 20 MG tablet  Commonly known as:  ZOCOR  Take 20 mg by mouth 3 (three) times a week.        Disposition: home   Final Dx: DLL L3-4, L4-5      Discharge Instructions   Call MD for:  difficulty breathing, headache or visual disturbances    Complete by:  As directed      Call MD for:  persistant nausea and vomiting    Complete by:  As directed      Call MD for:  redness, tenderness, or signs of infection (pain, swelling, redness, odor or green/yellow discharge around incision site)    Complete by:  As directed      Call MD for:  severe uncontrolled pain    Complete by:  As directed      Call MD for:  temperature >100.4    Complete by:  As directed      Diet - low sodium heart healthy    Complete by:  As directed      Discharge instructions    Complete by:  As directed   No driving, no bending or twisting     Increase activity slowly    Complete by:  As directed      Remove dressing in 48 hours  Complete by:  As directed               Signed: Eustace Moore 07/20/2013, 11:17 AM

## 2013-07-20 NOTE — Plan of Care (Signed)
Problem: Consults Goal: Diagnosis - Spinal Surgery Outcome: Completed/Met Date Met:  07/20/13 Lumbar Laminectomy (Complex)

## 2013-07-20 NOTE — Progress Notes (Signed)
Utilization review completed.  

## 2013-07-20 NOTE — Evaluation (Signed)
Physical Therapy Evaluation Patient Details Name: Eddie Sanders MRN: [id number] DOB: 22-Dec-1928 Today's Date: 07/20/2013   History of Present Illness  Patient is a 80 y.o. male admitted for decompressive laminectomy L3-5  Clinical Impression  Pt very pleasant and eager to return to swimming several days a week. Pt and spouse educated for all precautions and sequence of transfers and gait. Pt with post polio syndrome with right foot drop and states he has used AFO in the past but felt it accentuated quad weakness and stopped wearing it. Do recommend continued RW use rather than cane at this point to improve balance and gait. Pt will benefit from acute therapy to maximize mobility, balance, gait and function to decrease burden of care and increase independence.     Follow Up Recommendations Home health PT;Supervision for mobility/OOB    Equipment Recommendations  3in1 (PT)    Recommendations for Other Services OT consult     Precautions / Restrictions Precautions Precautions: Back;Fall Precaution Booklet Issued: Yes (comment)      Mobility  Bed Mobility Overal bed mobility: Needs Assistance Bed Mobility: Rolling;Sidelying to Sit Rolling: Supervision Sidelying to sit: Supervision       General bed mobility comments: cues for sequence and precautions  Transfers Overall transfer level: Needs assistance Equipment used: Rolling walker (2 wheeled) Transfers: Sit to/from Stand Sit to Stand: Min guard         General transfer comment: cues for posture and precautions as pt with tendency to bend and requires tactile cues to prevent bending  Ambulation/Gait Ambulation/Gait assistance: Supervision Ambulation Distance (Feet): 450 Feet Assistive device: Rolling walker (2 wheeled) Gait Pattern/deviations: Step-through pattern;Decreased dorsiflexion - right   Gait velocity interpretation: at or above normal speed for age/gender General Gait Details: pt with foot drop right and  apparent leg length discrepancy not formally assessed. Initiated gait with cane but pt with unsteady gait with increased presentation of gait deviation. With RW use increased stride, stability and balance.   Stairs Stairs: Yes Stairs assistance: Supervision Stair Management: One rail Right;One rail Left;Forwards;Step to pattern Number of Stairs: 3 General stair comments: x 2 trials with step to pattern with education for sequence with use of right rail to ascend and left rail to descend with use of HHA in place of cane for second hand with stairs  Wheelchair Mobility    Modified Rankin (Stroke Patients Only)       Balance Overall balance assessment: Needs assistance   Sitting balance-Leahy Scale: Good       Standing balance-Leahy Scale: Fair                               Pertinent Vitals/Pain No pain    Home Living Family/patient expects to be discharged to:: Private residence Living Arrangements: Spouse/significant other Available Help at Discharge: Family;Available 24 hours/day Type of Home: House Home Access: Stairs to enter;Ramped entrance Entrance Stairs-Rails: Right;Left Entrance Stairs-Number of Steps: 3 Home Layout: One level Home Equipment: Cane - single point;Walker - 2 wheels      Prior Function Level of Independence: Independent with assistive device(s)         Comments: pt has been using cane lately and reports no falls     Hand Dominance        Extremity/Trunk Assessment   Upper Extremity Assessment: Overall WFL for tasks assessed           Lower Extremity Assessment: RLE deficits/detail  RLE Deficits / Details: pt with post polio syndrome with foot drop and 2+/5 quad strength    Cervical / Trunk Assessment: Normal  Communication   Communication: No difficulties  Cognition Arousal/Alertness: Awake/alert Behavior During Therapy: WFL for tasks assessed/performed Overall Cognitive Status: Within Functional Limits for tasks  assessed                      General Comments      Exercises        Assessment/Plan    PT Assessment Patient needs continued PT services  PT Diagnosis Abnormality of gait   PT Problem List Decreased activity tolerance;Decreased balance;Decreased knowledge of precautions;Decreased knowledge of use of DME  PT Treatment Interventions Gait training;Functional mobility training;Therapeutic activities;Patient/family education;DME instruction;Therapeutic exercise   PT Goals (Current goals can be found in the Care Plan section) Acute Rehab PT Goals Patient Stated Goal: return to swimming PT Goal Formulation: With patient/family Time For Goal Achievement: 07/27/13 Potential to Achieve Goals: Good    Frequency Min 5X/week   Barriers to discharge Decreased caregiver support      Co-evaluation               End of Session   Activity Tolerance: Patient tolerated treatment well Patient left: in chair;with call bell/phone within reach;with family/visitor present Nurse Communication: Mobility status;Precautions         Time: 2831-5176 PT Time Calculation (min): 29 min   Charges:   PT Evaluation $Initial PT Evaluation Tier I: 1 Procedure PT Treatments $Gait Training: 8-22 mins $Therapeutic Activity: 8-22 mins   PT G Codes:          Mahogany Torrance B Garet Hooton 07/20/2013, 9:22 AM  Elwyn Reach, [place]

## 2013-07-21 ENCOUNTER — Encounter (HOSPITAL_COMMUNITY): Payer: Self-pay | Admitting: Neurological Surgery

## 2013-11-21 ENCOUNTER — Other Ambulatory Visit: Payer: Self-pay | Admitting: Dermatology

## 2013-12-05 ENCOUNTER — Other Ambulatory Visit: Payer: Self-pay | Admitting: Internal Medicine

## 2013-12-05 DIAGNOSIS — M48061 Spinal stenosis, lumbar region without neurogenic claudication: Secondary | ICD-10-CM

## 2013-12-06 ENCOUNTER — Ambulatory Visit (HOSPITAL_COMMUNITY)
Admission: RE | Admit: 2013-12-06 | Discharge: 2013-12-06 | Disposition: A | Discharge status: Home / Self Care | Payer: Medicare Other | Source: Ambulatory Visit | Attending: Vascular Surgery | Admitting: Vascular Surgery

## 2013-12-06 ENCOUNTER — Other Ambulatory Visit (HOSPITAL_COMMUNITY): Payer: Self-pay | Admitting: Internal Medicine

## 2013-12-06 DIAGNOSIS — M7989 Other specified soft tissue disorders: Secondary | ICD-10-CM

## 2013-12-06 DIAGNOSIS — M79604 Pain in right leg: Secondary | ICD-10-CM | POA: Insufficient documentation

## 2013-12-06 NOTE — Progress Notes (Signed)
Verbal preliminary report phoned to [facility]  @ [facility].

## 2013-12-08 ENCOUNTER — Ambulatory Visit
Admission: RE | Admit: 2013-12-08 | Discharge: 2013-12-08 | Disposition: A | Discharge status: Home / Self Care | Payer: Medicare Other | Source: Ambulatory Visit | Attending: Internal Medicine | Admitting: Internal Medicine

## 2013-12-08 DIAGNOSIS — M48061 Spinal stenosis, lumbar region without neurogenic claudication: Secondary | ICD-10-CM

## 2014-01-16 ENCOUNTER — Ambulatory Visit: Payer: Medicare Other | Attending: Internal Medicine

## 2014-01-16 DIAGNOSIS — R279 Unspecified lack of coordination: Secondary | ICD-10-CM | POA: Insufficient documentation

## 2014-01-16 DIAGNOSIS — R262 Difficulty in walking, not elsewhere classified: Secondary | ICD-10-CM | POA: Diagnosis not present

## 2014-01-17 NOTE — Therapy (Signed)
[facility] [address] [place], [place], [address] Phone: [phone]   Fax:  [phone]  Physical Therapy Evaluation  Patient Details  Name: Eddie Sanders MRN: [id number] Date of Birth: 05/15/1928  Encounter Date: 01/16/2014      PT End of Session - 01/17/14 0753    Visit Number 1   Number of Visits 17   Date for PT Re-Evaluation 03/17/14   Authorization Type [employer] medicare G codes required   PT Start Time 1015   PT Stop Time 1100   PT Time Calculation (min) 45 min      Past Medical History  Diagnosis Date  . Adenomatous colon polyp   . Prostate cancer   . Allergic rhinitis   . Polio 1939  . Post-polio syndrome   . Hyperlipidemia   . Skin cancer (melanoma)     Right eye  . GERD (gastroesophageal reflux disease)   . Arthritis     Past Surgical History  Procedure Laterality Date  . Prostatectomy    . Sigmoid colectomy    . Cataract surgery      bilateral  . Tonsillectomy    . Lumbar epidural injection    . Lumbar laminectomy/decompression microdiscectomy Right 07/19/2013    Procedure: LUMBAR LAMINECTOMY/DECOMPRESSION MICRODISCECTOMY 2 LEVELS     lumbar  three/four,  four/five;  Surgeon: Eustace Moore, MD;  Location: [facility];  Service: Neurosurgery;  Laterality: Right;    There were no vitals taken for this visit.  Visit Diagnosis:  Lack of coordination - Plan: PT plan of care cert/re-cert  Difficulty walking - Plan: PT plan of care cert/re-cert      Subjective Assessment - 01/16/14 1030    Symptoms Pt presents to the clinic with recent worsening balance impairment with 6 falls in the past 6 months. Pt reports feeling tremors in the right leg at times when ambulating and going up the steps. Doesn't feel comfortable stepping off low curbs. Began using a cane in 2010.  He has drop foot in the RLE and is wearing an off the shelf brace that provides minimal support of the ankle. He trialed some form of brace  in the past and he did not feel comfortable with it. Pt also reports that since his spinal surgery he hasn't been participating in regular physical activity that he was prior to the surgery (swimming).    Pertinent History Post polio syndrome (right leg more affected by polio) (polio was in 1939), history of lumbar spinal surgery July 19, 2013,     Patient Stated Goals Improve balance  and leg strength   Currently in Pain? No/denies          [facility] PT Assessment - 01/17/14 0750    Assessment   Medical Diagnosis Gait imbalance and post polio syndrome   Onset Date --  Gait imbalance since August 2015   Precautions   Precautions Fall;Other (comment)  Post Polio, energy conservation, no overuse   Required Braces or Orthoses --  Currently has a lightweight ankle brace with minimal support   Home Environment   Living Enviornment Private residence   Living Arrangements Spouse/significant other   Prior Function   Level of Independence Requires assistive device for independence;Other (comment)  needs assist at times with uneven surface walking   Observation/Other Assessments   Observations notable atrophy of BLE, worse on RLE   Focus on Therapeutic Outcomes (FOTO)  Functional Status 45   Other Surveys  Select  Activities of Balance Confidence Scale (ABC Scale)  36.9%   AROM   Right Ankle Dorsiflexion -10   PROM   Left Ankle Dorsiflexion 0   Strength   Overall Strength Comments All muscle contractions dimininsh within 3 seconds (no sustained holds)   Right Hip Flexion 3-/5   Left Hip Flexion 4/5   Right Knee Flexion 4/5   Right Knee Extension 1/5   Left Knee Flexion 4/5   Left Knee Extension 4/5   Right Ankle Dorsiflexion 3/5   Right Ankle Plantar Flexion 3/5   Ambulation/Gait   Ambulation/Gait Yes   Ambulation/Gait Assistance 5: Supervision   Assistive device Straight cane   Gait Pattern Wide base of support  bilateral foot slap, outtoeing bilateral, pelvic drop   Gait velocity  2.30  with cane; but without cane 2.12 ft/sec    Stairs Yes   Stairs Assistance 5: Supervision  close supervision   Stair Management Technique Step to pattern;With cane;One rail Right  poor coordination of cane   Ramp 4: Min assist  due to loss of balance descending   Ramp Details (indicate cue type and reason) negotiates ramp with lateral stepping   Curb 3: Mod assist   Standardized Balance Assessment   Standardized Balance Assessment Timed Up and Go Test   Timed Up and Go Test   TUG Normal TUG   Normal TUG (seconds) 11.47  with cane, uncontrolled sit, unsteady turn              PT Short Term Goals - 01/17/14 1719    PT SHORT TERM GOAL #1   Title To be met by 02/16/14: Demonstrate correct performance of home exercise program for strengthening and balance training.   Status New   PT SHORT TERM GOAL #2   Title To be met by 02/16/14: Develop a safe strategy to negotiate a curb using cane, and implement it with close supervision.   Status New   PT SHORT TERM GOAL #3   Title To be met by 02/16/14: Demonstrate ability to negotiate a ramp safely up/down with cane and distance supervision.   Status New   PT SHORT TERM GOAL #4   Title To be met by 02/16/14: Complete DGI and write appropriate STG__________________________   Status New   PT SHORT TERM GOAL #5   Title To be met by 02/16/14: Ambulate 100' on grass with cane and AFO if needed, with CGA   Status New          PT Long Term Goals - 01/17/14 1724    PT LONG TERM GOAL #1   Title To be met by 03/17/14: Verbalize understanding of energy conservation strategys due to post polio syndrome, to prevent overuse.   Status New   PT LONG TERM GOAL #2   Title To be met by 03/17/14: Negotiate ramp with MOD I with cane.   Status New   PT LONG TERM GOAL #3   Title To be met by 03/17/14: Negotiate curb with MOD I with cane.   Status New   PT LONG TERM GOAL #4   Title To be met by 03/17/14: Ambulate on grass x300' with supervision with  AFO(s) if needed.   Status New   PT LONG TERM GOAL #5   Title To be met by 03/17/14: Complete DGI and write appropriate LTG:___________________   Status New   Additional Long Term Goals   Additional Long Term Goals Yes   PT LONG TERM GOAL #6  Title To be met by 03/17/14: Increase FOTO ABC score to 50% for increased balance confidence.   Status New          Plan - 01/23/14 0755    Clinical Impression Statement Pt presents with weakness and balance impairments due to post polio syndrome, spinal surgery this past summer, and decreased activity level. Foot drop and hip instability further compound his balance impairments and he has had frequent falls recently. Pt will benefit from skilled PT services to determine if an AFO will be appropriate for controlling foot drop, for strengthening, balance training, and to improve functional mobility.   Pt will benefit from skilled therapeutic intervention in order to improve on the following deficits Abnormal gait;Decreased balance;Decreased coordination;Decreased knowledge of use of DME;Decreased strength;Difficulty walking   Rehab Potential Good   Clinical Impairments Affecting Rehab Potential progressive decline common with post polio   PT Frequency 2x / week   PT Duration 8 weeks   PT Treatment/Interventions ADLs/Self Care Home Management;DME Instruction;Therapeutic activities;Functional mobility training;Stair training;Gait training;Therapeutic exercise;Balance training;Neuromuscular re-education;Patient/family education;Other (comment)  provision and training with orthotics   PT Next Visit Plan Complete DGI and write appropriate goals, give home exercise program for strengthening and balance including balance on compliant surface   Recommended Other Services orthotic consult   Consulted and Agree with Plan of Care Patient          G-Codes - January 23, 2014 1729    Functional Assessment Tool Used FOTO ABC score  =36.9% confidence   Functional  Limitation Mobility: Walking and moving around   Mobility: Walking and Moving Around Current Status [phone]) At least 60 percent but less than 80 percent impaired, limited or restricted   Mobility: Walking and Moving Around Goal Status [phone]) At least 40 percent but less than 60 percent impaired, limited or restricted                            Problem List Patient Active Problem List   Diagnosis Date Noted  . S/P lumbar laminectomy 07/19/2013    [name] D 2014/01/23, 5:34 PM

## 2014-01-18 ENCOUNTER — Ambulatory Visit: Payer: Medicare Other

## 2014-01-18 DIAGNOSIS — R279 Unspecified lack of coordination: Secondary | ICD-10-CM | POA: Diagnosis not present

## 2014-01-18 DIAGNOSIS — R262 Difficulty in walking, not elsewhere classified: Secondary | ICD-10-CM

## 2014-01-18 NOTE — Patient Instructions (Signed)
Achilles / Gastroc, Standing   Hold onto the countertop with one hand and a chair by your side with the other hand. Stand, right foot in back, heel on floor and turned slightly out, leg straight, left leg bent. Keep back straight and chest out. Move hips forward until you feel a gentle stretch.  Hold 30 seconds. Repeat 3 times per session on each leg. Do 2 sessions per day.  Copyright  VHI. All rights reserved.  Weight Shift: Anterior / Posterior (Limits of Stability)   Hold onto counter in one hand and the back of a chair in the other. Slowly shift weight backward until toes begin to rise off floor. Return to starting position. Shift weight slowly forward until heels begin to rise off floor. Be sure to keep back/hips/knees straight. Hold each position 2 seconds. Repeat 20 times. Do this daily.  Copyright  VHI. All rights reserved.  Marching In-Place   Hold onto countertop and march high as you walk forward. With each march, hold the leg up high for 2 seconds. Perform 2 laps at counter per day.  Copyright  VHI. All rights reserved.  Tandem Walking   Hold onto countertop and walk with each foot directly in front of other, heel of one foot touching toes of other foot with each step. Both feet straight ahead. Perform 2 laps daily.    Sit to stands: 1. Scoot to the edge of the chair 2. Position your feet back so heels are rising from the floor 2. Lean forward as far as you can and feel the weight on your toes 4. Stick your bottom out as you stand (your knees will finish straightening before your back)  Perform 3x daily and sit back down slowly.    Copyright  VHI. All rights reserved.

## 2014-01-18 NOTE — Therapy (Addendum)
[facility] [address] [place], [place], [address] Phone: [phone]   Fax:  [phone]  Physical Therapy Treatment  Patient Details  Name: Eddie Sanders MRN: [id number] Date of Birth: [birth year]  Encounter Date: 01/18/2014      PT End of Session - 01/18/14 1333    Visit Number 2   Number of Visits 17   Date for PT Re-Evaluation 03/17/14   Authorization Type [employer] medicare G codes required   PT Start Time 1020   PT Stop Time 1100   PT Time Calculation (min) 40 min   Activity Tolerance Patient tolerated treatment well      Past Medical History  Diagnosis Date  . Adenomatous colon polyp   . Prostate cancer   . Allergic rhinitis   . Polio 1939  . Post-polio syndrome   . Hyperlipidemia   . Skin cancer (melanoma)     Right eye  . GERD (gastroesophageal reflux disease)   . Arthritis     Past Surgical History  Procedure Laterality Date  . Prostatectomy    . Sigmoid colectomy    . Cataract surgery      bilateral  . Tonsillectomy    . Lumbar epidural injection    . Lumbar laminectomy/decompression microdiscectomy Right 07/19/2013    Procedure: LUMBAR LAMINECTOMY/DECOMPRESSION MICRODISCECTOMY 2 LEVELS     lumbar  three/four,  four/five;  Surgeon: Eustace Moore, MD;  Location: [place] [facility];  Service: Neurosurgery;  Laterality: Right;    There were no vitals taken for this visit.  Visit Diagnosis:  Lack of coordination  Difficulty walking      Subjective Assessment - 01/18/14 1027    Symptoms Went to the pool and swam 1/3 mile and I'm feeling pretty tired from it.   Currently in Pain? No/denies      Therex: Home exercise program taught and provided to pt to improve flexibliity strength and balance. See pt instructions for details.  Self care: Pt was instructed to decrease his swimming distance to 1/8th of a mile with a 3 minute rest, and if he doesn't feel fatigued he can do another 1/8th. He was instructed  to do this only 2x per week. After 2 weeks of this, he may increase by 1/8th of a mile if he hasn't experienced any overuse pain or fatigue. After 2 more weeks of this he may increase again by 1/8 mile to reach a final distance of 1/2 mile which is what the pt reports he swam in the past--before his operation.     [facility] PT Assessment - 01/18/14 0001    Standardized Balance Assessment   Standardized Balance Assessment Dynamic Gait Index   Dynamic Gait Index   Level Surface Mild Impairment   Change in Gait Speed Mild Impairment   Gait with Horizontal Head Turns Moderate Impairment   Gait with Vertical Head Turns Mild Impairment   Gait and Pivot Turn Severe Impairment   Step Over Obstacle Severe Impairment   Step Around Obstacles Normal   Steps Moderate Impairment   Total Score 11            PT Education - 01/18/14 1332    Education provided Yes   Education Details HEP-see pt instruction/ and verbally provided education regarding safe return to swimming to avoid overuse   Person(s) Educated Patient   Methods Explanation;Handout;Demonstration   Comprehension Verbalized understanding;Returned demonstration          PT Short Term Goals - 01/18/14 1736  PT SHORT TERM GOAL #1   Title To be met by 02/16/14: Demonstrate correct performance of home exercise program for strengthening and balance training.   Status New   PT SHORT TERM GOAL #2   Title To be met by 02/16/14: Develop a safe strategy to negotiate a curb using cane, and implement it with close supervision.   Status On-going   PT SHORT TERM GOAL #3   Title To be met by 02/16/14: Demonstrate ability to negotiate a ramp safely up/down with cane and distance supervision.   Status On-going   PT SHORT TERM GOAL #4   Title To be met by 02/16/14: Complete DGI to 14/24 for improved balance.   Status On-going   PT SHORT TERM GOAL #5   Title To be met by 02/16/14: Ambulate 100' on grass with cane and AFO if needed, with CGA   Status  On-going          PT Long Term Goals - 01/18/14 1738    PT LONG TERM GOAL #1   Title To be met by 03/17/14: Verbalize understanding of energy conservation strategys due to post polio syndrome, to prevent overuse.   Status On-going   PT LONG TERM GOAL #2   Title To be met by 03/17/14: Negotiate ramp with MOD I with cane.   Status On-going   PT LONG TERM GOAL #3   Title To be met by 03/17/14: Negotiate curb with MOD I with cane.   Status On-going   PT LONG TERM GOAL #4   Title To be met by 03/17/14: Ambulate on grass x300' with supervision with AFO(s) if needed.   Status On-going   PT LONG TERM GOAL #5   Title To be met by 03/17/14: Increase DGI score to 16/24 for improved dynamic balance.   Status On-going   PT LONG TERM GOAL #6   Title To be met by 03/17/14: Increase FOTO ABC score to 50% for increased balance confidence.   Status New          Plan - 01/18/14 1333    Clinical Impression Statement Pt may be too agressive with his return to swimming at full effort on day 1. He was thoroughly educated today on safeguards for return to swimming without overuse. HEP provided. Pt is expected to progress toward goals.   Consulted and Agree with Plan of Care Patient          G-Codes - February 05, 2014 1729    Functional Assessment Tool Used FOTO ABC score  =36.9% confidence   Functional Limitation Mobility: Walking and moving around   Mobility: Walking and Moving Around Current Status [phone]) At least 60 percent but less than 80 percent impaired, limited or restricted   Mobility: Walking and Moving Around Goal Status [phone]) At least 40 percent but less than 60 percent impaired, limited or restricted                             Problem List Patient Active Problem List   Diagnosis Date Noted  . S/P lumbar laminectomy 07/19/2013    [name] D 01/18/2014, 5:39 PM

## 2014-01-22 ENCOUNTER — Ambulatory Visit: Payer: Medicare Other

## 2014-01-25 ENCOUNTER — Ambulatory Visit: Payer: Medicare Other

## 2014-04-25 ENCOUNTER — Other Ambulatory Visit: Payer: Self-pay | Admitting: *Deleted

## 2014-04-25 DIAGNOSIS — I872 Venous insufficiency (chronic) (peripheral): Secondary | ICD-10-CM

## 2014-05-22 ENCOUNTER — Other Ambulatory Visit: Payer: Self-pay | Admitting: Dermatology

## 2014-06-04 ENCOUNTER — Encounter: Payer: Self-pay | Admitting: Vascular Surgery

## 2014-06-05 ENCOUNTER — Ambulatory Visit (HOSPITAL_COMMUNITY)
Admission: RE | Admit: 2014-06-05 | Discharge: 2014-06-05 | Disposition: A | Discharge status: Home / Self Care | Payer: Medicare Other | Source: Ambulatory Visit | Attending: Vascular Surgery | Admitting: Vascular Surgery

## 2014-06-05 ENCOUNTER — Encounter: Payer: Self-pay | Admitting: Vascular Surgery

## 2014-06-05 ENCOUNTER — Ambulatory Visit (INDEPENDENT_AMBULATORY_CARE_PROVIDER_SITE_OTHER): Payer: Medicare Other | Admitting: Vascular Surgery

## 2014-06-05 VITALS — BP 112/71 | HR 76 | Ht 71.0 in | Wt 171.0 lb

## 2014-06-05 DIAGNOSIS — M7989 Other specified soft tissue disorders: Secondary | ICD-10-CM | POA: Insufficient documentation

## 2014-06-05 DIAGNOSIS — I872 Venous insufficiency (chronic) (peripheral): Secondary | ICD-10-CM

## 2014-06-05 NOTE — Progress Notes (Addendum)
HISTORY AND PHYSICAL     CC:  Swelling in lower legs Referring Provider:  Crist Infante, MD  HPI: This is a 81 y.o. male who is a pt of Dr. Joylene Draft who presents today for evaluation for leg swelling bilaterally.  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  He does have a right foot drop and [place] [facility].  His swelling in his right leg started shortly after that.  He has swelling in both legs that are about the same.  He states that he went to have a cortisone shot in his left knee and the PA noticed his swelling and suggested compression stockings or follow up with a specialist, which is why he is here today.    He states that is right leg is weaker than his left from the polio.  He states the thinks the swelling is worse in his left leg.    He states that he did have lumbar surgery about a year ago.  He states that up until about 3 months ago, [facility] for 3 minutes and swim again.  He stopped doing this about 3 months ago as his polio doctor warned him about fatigue.  He wants to start swimming again.  He has a hx of prostate cancer in 1995 with radiation in 2006.    He does have a statin 3x/week.  He is not on a diuretic.    Past Medical History  Diagnosis Date  . Adenomatous colon polyp   . Prostate cancer   . Allergic rhinitis   . Polio 1939  . Post-polio syndrome   . Hyperlipidemia   . Skin cancer (melanoma)     Right eye  . GERD (gastroesophageal reflux disease)   . Arthritis     Past Surgical History  Procedure Laterality Date  . Prostatectomy    . Sigmoid colectomy    . Cataract surgery      bilateral  . Tonsillectomy    . Lumbar epidural injection    . Lumbar laminectomy/decompression microdiscectomy Right 07/19/2013    Procedure: LUMBAR LAMINECTOMY/DECOMPRESSION MICRODISCECTOMY 2 LEVELS     lumbar  three/four,  four/five;  Surgeon: Eustace Moore, MD;  Location:  [facility];  Service: Neurosurgery;  Laterality: Right;    Allergies  Allergen Reactions  . Penicillins     REACTION: hives, rash, swelling    Current Outpatient Prescriptions  Medication Sig Dispense Refill  . Calcium Carb-Cholecalciferol (CALCIUM 600 + D PO) Take 1 tablet by mouth daily.    . cholecalciferol (VITAMIN D) 1000 UNITS tablet Take 1,000 Units by mouth daily.    . Coenzyme Q10 200 MG capsule Take 200 mg by mouth daily.    . cyanocobalamin 500 MCG tablet Take 500 mcg by mouth daily.    . Multiple Vitamins-Minerals (CENTRUM SILVER ADULT 50+ PO) Take 1 tablet by mouth daily.    . simvastatin (ZOCOR) 20 MG tablet Take 20 mg by mouth 3 (three) times a week.      . doxycycline (VIBRA-TABS) 100 MG tablet Take 100 mg by mouth 2 (two) times daily.    Marland Kitchen oxyCODONE-acetaminophen (PERCOCET/ROXICET) 5-325 MG per tablet Take 1-2 tablets by mouth every 4 (four) hours as needed for moderate pain. (Patient not taking: Reported on 01/16/2014) 30 tablet 0   No current facility-administered medications for this visit.  Family History  Problem Relation Age of Onset  . Pancreatic cancer Father   . Cancer Father   . Heart failure Mother   . Heart disease Mother   . Colon cancer Neg Hx     History   Social History  . Marital Status: Married    Spouse Name: N/A  . Number of Children: 1  . Years of Education: N/A   Occupational History  . retired    Social History Main Topics  . Smoking status: Current Some Day Smoker -- 30 years    Types: Cigars  . Smokeless tobacco: Never Used     Comment: has occasional cigar ;stopped cigarettes in 1965  . Alcohol Use: 0.0 oz/week    0 Standard drinks or equivalent per week     Comment: 2 oz scotch, 4 oz wine  . Drug Use: No  . Sexual Activity: Not on file   Other Topics Concern  . Not on file   Social History Narrative     ROS: [x]  Positive   [ ]  Negative   [ ]  All sytems reviewed and are negative  Cardiovascular: []  chest  pain/pressure []  palpitations []  SOB lying flat []  DOE []  pain in legs while walking []  pain in feet when lying flat []  hx of DVT []  hx of phlebitis [x]  swelling in legs []  varicose veins  Pulmonary: []  productive cough []  asthma []  wheezing  Neurologic: [x]  weakness in []  arms [x]  legs-post polio syndrome []  numbness in []  arms []  legs [] difficulty speaking or slurred speech []  temporary loss of vision in one eye []  dizziness  Hematologic: []  bleeding problems []  problems with blood clotting easily  GI []  vomiting blood []  blood in stool  GU: []  burning with urination []  blood in urine  Psychiatric: []  hx of major depression  Integumentary: []  rashes []  ulcers  Constitutional: []  fever []  chills   PHYSICAL EXAMINATION:  Filed Vitals:   06/05/14 1426  BP: 112/71  Pulse:    Body mass index is 23.86 kg/(m^2).  General:  WDWN in NAD Gait: Not observed HENT: WNL, normocephalic Pulmonary: normal non-labored breathing , without Rales, rhonchi,  wheezing Cardiac: RRR, without  Murmurs, rubs or gallops; without carotid bruits Abdomen: soft, NT, no masses Skin: without rashes, without ulcers  Vascular Exam/Pulses:  Right Left  Radial 2+ (normal) 2+ (normal)  Popliteal 2+ (normal) 2+ (normal)  DP 2+ (normal) absent  PT absent absent   Extremities: without ischemic changes, without Gangrene , without cellulitis; without open wounds; + BLE swelling Musculoskeletal: no muscle wasting or atrophy  Neurologic: A&O X 3; Appropriate Affect ; SENSATION: normal; MOTOR FUNCTION:  moving all extremities equally. Speech is fluent/normal   Non-Invasive Vascular Imaging:   Venous duplex reflux exam 06/05/14: No significant reflux in deep or superficial system bilaterally.  There is no deep vein thrombosis  Pt meds includes: Statin:  Yes.   Beta Blocker:  No. Aspirin:  No. ACEI:  No. ARB:  No. Other Antiplatelet/Anticoagulant:  No.    ASSESSMENT/PLAN:: 81  y.o. male with post polio syndrome who presents with bilateral leg swelling   -pt's venous reflux study is normal today with no evidence of reflux. -compression stockings if swelling worsens -may continue to swim from VVS standpoint -we will see him back as needed   Leontine Locket, PA-C [facility] [phone]  Clinic MD:  Pt seen and examined in conjunction with Dr. Donnetta Hutching  I have examined the patient, reviewed and  agree with above.  EARLY, TODD, MD 06/05/2014 3:48 PM

## 2014-08-15 ENCOUNTER — Encounter: Payer: Self-pay | Admitting: Vascular Surgery

## 2014-08-17 ENCOUNTER — Encounter (INDEPENDENT_AMBULATORY_CARE_PROVIDER_SITE_OTHER): Payer: Medicare Other

## 2014-08-17 DIAGNOSIS — M7989 Other specified soft tissue disorders: Secondary | ICD-10-CM

## 2014-10-25 ENCOUNTER — Telehealth: Payer: Self-pay | Admitting: Vascular Surgery

## 2014-10-25 ENCOUNTER — Other Ambulatory Visit: Payer: Self-pay | Admitting: *Deleted

## 2014-10-25 DIAGNOSIS — M7989 Other specified soft tissue disorders: Principal | ICD-10-CM

## 2014-10-25 DIAGNOSIS — M79661 Pain in right lower leg: Secondary | ICD-10-CM

## 2014-10-25 NOTE — Telephone Encounter (Signed)
-----   Message from Rica Records, RN sent at 10/25/2014  1:00 PM EDT ----- Regarding: scheduling Spoke with Dr. Donnetta Hutching about Eddie Sanders.  He has an appointment to see Dr. Donnetta Hutching 11-06-2014 at 2:15 PM.  Dr. Donnetta Hutching wants him to have a venous reflux study (right leg) before he sees him.  Eddie Sanders is c/o right foot, ankle, lower calf increased swelling, tightness, and pain.  Thanks!  Let me know when you can schedule the lab and I will put in the order.  (I am here today but away from office 10-26-2014---11-05-2014.)   Thanks!

## 2014-10-25 NOTE — Telephone Encounter (Signed)
LM for pt regarding need for study and potential date of 09/16- waiting for pt to call and confirm, dpm

## 2014-10-26 ENCOUNTER — Other Ambulatory Visit: Payer: Self-pay | Admitting: *Deleted

## 2014-10-26 DIAGNOSIS — M79605 Pain in left leg: Secondary | ICD-10-CM

## 2014-10-26 DIAGNOSIS — M79604 Pain in right leg: Secondary | ICD-10-CM

## 2014-11-02 ENCOUNTER — Ambulatory Visit (HOSPITAL_COMMUNITY)
Admission: RE | Admit: 2014-11-02 | Discharge: 2014-11-02 | Disposition: A | Discharge status: Home / Self Care | Payer: Medicare Other | Source: Ambulatory Visit | Attending: Vascular Surgery | Admitting: Vascular Surgery

## 2014-11-02 ENCOUNTER — Telehealth: Payer: Self-pay | Admitting: Vascular Surgery

## 2014-11-02 DIAGNOSIS — M79661 Pain in right lower leg: Secondary | ICD-10-CM

## 2014-11-02 DIAGNOSIS — M7989 Other specified soft tissue disorders: Secondary | ICD-10-CM

## 2014-11-02 DIAGNOSIS — M79604 Pain in right leg: Secondary | ICD-10-CM | POA: Diagnosis not present

## 2014-11-02 DIAGNOSIS — M79605 Pain in left leg: Secondary | ICD-10-CM

## 2014-11-02 NOTE — Telephone Encounter (Signed)
-----   Message from Gena Fray sent at 11/02/2014  2:22 PM EDT ----- Regarding: FW: Order Change   ----- Message -----    From: Gena Fray    Sent: 10/26/2014  11:38 AM      To: [facility] Subject: Order Change                                   Patient called to state that he is having issues with his LLE as well as the right and requested that we duplex both legs on 09/16 instead of just the R.  Can we change the order to reflect bilateral? Does this have to be triaged first?  Thanks, Hinton Dyer

## 2014-11-02 NOTE — Telephone Encounter (Signed)
Recvd call from [place] that she had updated the order to reflect bilateral, dpm

## 2014-11-05 ENCOUNTER — Encounter: Payer: Self-pay | Admitting: Vascular Surgery

## 2014-11-06 ENCOUNTER — Encounter: Payer: Self-pay | Admitting: Vascular Surgery

## 2014-11-06 ENCOUNTER — Ambulatory Visit (INDEPENDENT_AMBULATORY_CARE_PROVIDER_SITE_OTHER): Payer: Medicare Other | Admitting: Vascular Surgery

## 2014-11-06 VITALS — BP 123/74 | HR 67 | Temp 98.6°F | Resp 18 | Ht 71.0 in | Wt 184.0 lb

## 2014-11-06 DIAGNOSIS — M7989 Other specified soft tissue disorders: Secondary | ICD-10-CM | POA: Diagnosis not present

## 2014-11-06 NOTE — Progress Notes (Signed)
    History of Present Illness  Eddie Sanders is a 81 y.o. (06/18/1928) male who presents for evaluation of his bilateral leg edema. He was last seen in the office in April with normal venous reflux studies. He was recommended compression stockings if his symptoms worsened. He returns today with continued complaints of leg swelling, left greater than right. He suffers from post-polio syndrome with residual left leg weakness. He ambulates modestly but does swim regularly.  He denies any pain in his legs. He only has tenderness around the medial ankle with palpation. He reports no improvement of swelling with compression stockings.   The patient's PMH, PSH, SH, FamHx, Med, and Allergies are unchanged from 06/05/14.   Physical Examination  Filed Vitals:   11/06/14 1449  BP: 123/74  Pulse: 67  Temp: 98.6 F (37 C)  TempSrc: Oral  Resp: 18  Height: 5\' 11"  (1.803 m)  Weight: 184 lb (83.462 kg)  SpO2: 98%   Body mass index is 25.67 kg/(m^2).  General: A&O x 3, WDWN male in NAD  Pulmonary: Sym exp, good air movt, CTAB, no rales, rhonchi, & wheezing  Cardiac: RRR, Nl S1, S2, no Murmurs, rubs or gallops  Vascular: 2+ pitting edema lower legs bilaterally. Palpable left DP pulse. Non palpable right pedal pulse, but foot is warm and well perfused.   Musculoskeletal: Extremities without ischemic changes  Neurologic: Pain and light touch intact in extremities Non-Invasive Vascular Imaging  BLE Venous Insufficiency Duplex (Date: 11/02/14):   RLE: negative DVT, negative GSV reflux,  deep venous reflux in common femoral vein  LLE: negative DVT, negative GSV reflux, negative deep venous reflux  Medical Decision Making  Eddie Sanders is a 81 y.o. male who presents with:bilateral lower extremity swelling without evidence of venous insufficiency   Recommended use of compression stockings and elevation for swelling relief. If his symptoms worsen and become intolerable, discussed use of  diuretics. Will have patient see PCP regarding this. Follow up as needed.    Virgina Jock, PA-C [facility] Office: [phone] Pager: [phone]  11/06/2014, 3:31 PM  This patient was seen in conjunction with Dr. Donnetta Hutching     I have examined the patient, reviewed and agree with above.  Curt Jews, MD 11/06/2014 3:55 PM

## 2015-01-21 ENCOUNTER — Encounter: Payer: Self-pay | Admitting: Rehabilitative and Restorative Service Providers"

## 2015-01-21 NOTE — Therapy (Signed)
[facility] [address] [place], [place], [address] Phone: [phone]   Fax:  [phone]  Patient Details  Name: Eddie Sanders MRN: [id number] Date of Birth: [birth year] Referring Provider:  No ref. provider found  Encounter Date: Last encounter 01/18/2014  The patient was discharged from PT after 2 visits due to cancelling all remaining visits due to undergoing a further medical work-up by neurology.  See initial evaluation completed by Theressa Millard, PT for further patient status. Thank you for the referral of this patient. Rudell Cobb, MPT    Halen Mossbarger 01/21/2015, 1:53 PM  [facility] [address] [place], [place], [address] Phone: [phone]   Fax:  [phone]

## 2015-07-03 ENCOUNTER — Encounter: Payer: Self-pay | Admitting: Internal Medicine

## 2018-11-22 ENCOUNTER — Telehealth (HOSPITAL_COMMUNITY): Payer: Self-pay | Admitting: *Deleted

## 2018-11-22 NOTE — Telephone Encounter (Signed)
The above patient or their representative was contacted and gave the following answers to these questions:         Do you have any of the following symptoms?n  Fever                    Cough                   Shortness of breath  Do  you have any of the following other symptoms?    muscle pain         vomiting,        diarrhea        rash         weakness        red eye        abdominal pain         bruising          bruising or bleeding              joint pain           severe headache    Have you been in contact with someone who was or has been sick in the past 2 weeks?n  Yes                 Unsure                         Unable to assess   Does the person that you were in contact with have any of the following symptoms?   Cough         shortness of breath           muscle pain         vomiting,            diarrhea            rash            weakness           fever            red eye           abdominal pain           bruising  or  bleeding                joint pain                severe headache               Have you  or someone you have been in contact with traveled internationally in th last month?   n      If yes, which countries?   Have you  or someone you have been in contact with traveled outside [place] in th last month?    n     If yes, which state and city?   COMMENTS OR ACTION PLAN FOR THIS PATIENT:         

## 2018-11-23 ENCOUNTER — Other Ambulatory Visit: Payer: Self-pay

## 2018-11-23 ENCOUNTER — Ambulatory Visit (HOSPITAL_COMMUNITY)
Admission: RE | Admit: 2018-11-23 | Discharge: 2018-11-23 | Disposition: A | Discharge status: Home / Self Care | Payer: Medicare Other | Source: Ambulatory Visit | Attending: Family | Admitting: Family

## 2018-11-23 ENCOUNTER — Other Ambulatory Visit (HOSPITAL_COMMUNITY): Payer: Self-pay | Admitting: Internal Medicine

## 2018-11-23 DIAGNOSIS — M79606 Pain in leg, unspecified: Secondary | ICD-10-CM | POA: Diagnosis present

## 2018-12-27 ENCOUNTER — Other Ambulatory Visit (HOSPITAL_COMMUNITY): Payer: Self-pay | Admitting: *Deleted

## 2018-12-28 ENCOUNTER — Other Ambulatory Visit: Payer: Self-pay

## 2018-12-28 ENCOUNTER — Ambulatory Visit (HOSPITAL_COMMUNITY)
Admission: RE | Admit: 2018-12-28 | Discharge: 2018-12-28 | Disposition: A | Discharge status: Home / Self Care | Payer: Medicare Other | Source: Ambulatory Visit | Attending: Internal Medicine | Admitting: Internal Medicine

## 2018-12-28 DIAGNOSIS — M810 Age-related osteoporosis without current pathological fracture: Secondary | ICD-10-CM | POA: Diagnosis not present

## 2018-12-28 MED ORDER — DENOSUMAB 60 MG/ML ~~LOC~~ SOSY
60.0000 mg | PREFILLED_SYRINGE | Freq: Once | SUBCUTANEOUS | Status: AC
  Administered 2018-12-28: 13:00:00 60 mg via SUBCUTANEOUS

## 2018-12-28 MED ORDER — DENOSUMAB 60 MG/ML ~~LOC~~ SOSY
PREFILLED_SYRINGE | SUBCUTANEOUS | Status: AC
  Administered 2018-12-28: 60 mg via SUBCUTANEOUS
  Filled 2018-12-28: qty 1

## 2019-01-26 ENCOUNTER — Other Ambulatory Visit: Payer: Self-pay | Admitting: Internal Medicine

## 2019-01-26 DIAGNOSIS — N644 Mastodynia: Secondary | ICD-10-CM

## 2019-02-08 ENCOUNTER — Ambulatory Visit
Admission: RE | Admit: 2019-02-08 | Discharge: 2019-02-08 | Disposition: A | Discharge status: Home / Self Care | Payer: Medicare Other | Source: Ambulatory Visit | Attending: Internal Medicine | Admitting: Internal Medicine

## 2019-02-08 ENCOUNTER — Other Ambulatory Visit: Payer: Self-pay

## 2019-02-08 DIAGNOSIS — N644 Mastodynia: Secondary | ICD-10-CM

## 2019-02-13 ENCOUNTER — Other Ambulatory Visit: Payer: Self-pay

## 2019-02-13 ENCOUNTER — Emergency Department (HOSPITAL_COMMUNITY)
Admission: EM | Admit: 2019-02-13 | Discharge: 2019-02-13 | Disposition: A | Discharge status: Home / Self Care | Payer: Medicare Other | Attending: Emergency Medicine | Admitting: Emergency Medicine

## 2019-02-13 ENCOUNTER — Encounter (HOSPITAL_COMMUNITY): Payer: Self-pay | Admitting: *Deleted

## 2019-02-13 DIAGNOSIS — Y939 Activity, unspecified: Secondary | ICD-10-CM | POA: Insufficient documentation

## 2019-02-13 DIAGNOSIS — Y929 Unspecified place or not applicable: Secondary | ICD-10-CM | POA: Diagnosis not present

## 2019-02-13 DIAGNOSIS — W07XXXA Fall from chair, initial encounter: Secondary | ICD-10-CM | POA: Diagnosis not present

## 2019-02-13 DIAGNOSIS — Z79899 Other long term (current) drug therapy: Secondary | ICD-10-CM | POA: Diagnosis not present

## 2019-02-13 DIAGNOSIS — F17290 Nicotine dependence, other tobacco product, uncomplicated: Secondary | ICD-10-CM | POA: Insufficient documentation

## 2019-02-13 DIAGNOSIS — Y999 Unspecified external cause status: Secondary | ICD-10-CM | POA: Diagnosis not present

## 2019-02-13 DIAGNOSIS — Z23 Encounter for immunization: Secondary | ICD-10-CM | POA: Insufficient documentation

## 2019-02-13 DIAGNOSIS — S0101XA Laceration without foreign body of scalp, initial encounter: Secondary | ICD-10-CM | POA: Diagnosis not present

## 2019-02-13 DIAGNOSIS — S0990XA Unspecified injury of head, initial encounter: Secondary | ICD-10-CM | POA: Diagnosis present

## 2019-02-13 MED ORDER — TETANUS-DIPHTH-ACELL PERTUSSIS 5-2.5-18.5 LF-MCG/0.5 IM SUSP
0.5000 mL | Freq: Once | INTRAMUSCULAR | Status: AC
  Administered 2019-02-13: 0.5 mL via INTRAMUSCULAR
  Filled 2019-02-13: qty 0.5

## 2019-02-13 NOTE — Discharge Instructions (Signed)
Use ice on sore area 3-4 times a day.  Keep the wound clean with soap and water, daily.  Apply Neosporin to the wound, until it is healed after cleansing.  Have the staples removed in 7 to 10 days.  Return here if needed for problems with a head injury.

## 2019-02-13 NOTE — ED Triage Notes (Signed)
Pt fell back from his chair when pushing away from the table today. Pt had the wound cleaned by a nurse at [facility], who sent him to ED for possible staples.

## 2019-02-13 NOTE — ED Notes (Signed)
Per patient request, patient's wife updated that patient is up for discharge.

## 2019-02-13 NOTE — ED Provider Notes (Signed)
[facility] DEPT Provider Note   CSN: [id number] Arrival date & time: 02/13/19  1559     History Chief Complaint  Patient presents with  . Head Laceration    Eddie Sanders is a 85 y.o. male.  HPI He is here for evaluation of injury from fall.  He fell, from a seated position as he was pushing away from table.  He was able to get out off the floor and ambulate afterwards.  There is no loss of consciousness.  He denies headache, neck pain, extremity pain, back pain, weakness or dizziness.  He takes a baby aspirin per day but no anticoagulants.  He feels like this was an accidental fall, and there was no prodrome.  He does not have any recent illnesses.  Last tetanus booster stated to be about 7 years ago.  There are no other known modifying factors.    Past Medical History:  Diagnosis Date  . Adenomatous colon polyp   . Allergic rhinitis   . Arthritis   . GERD (gastroesophageal reflux disease)   . Hyperlipidemia   . Polio 1939  . Post-polio syndrome   . Prostate cancer ([facility])   . Skin cancer (melanoma) ([facility])    Right eye    Patient Active Problem List   Diagnosis Date Noted  . S/P lumbar laminectomy 07/19/2013    Past Surgical History:  Procedure Laterality Date  . cataract surgery     bilateral  . LUMBAR EPIDURAL INJECTION    . LUMBAR LAMINECTOMY/DECOMPRESSION MICRODISCECTOMY Right 07/19/2013   Procedure: LUMBAR LAMINECTOMY/DECOMPRESSION MICRODISCECTOMY 2 LEVELS     lumbar  three/four,  four/five;  Surgeon: Eustace Moore, MD;  Location: [facility];  Service: Neurosurgery;  Laterality: Right;  . PROSTATECTOMY    . sigmoid colectomy    . TONSILLECTOMY         Family History  Problem Relation Age of Onset  . Pancreatic cancer Father   . Cancer Father   . Heart failure Mother   . Heart disease Mother   . Colon cancer Neg Hx     Social History   Tobacco Use  . Smoking status: Current Some Day Smoker    Years: 30.00   Types: Cigars  . Smokeless tobacco: Never Used  . Tobacco comment: has occasional cigar ;stopped cigarettes in 1965  Substance Use Topics  . Alcohol use: Yes    Alcohol/week: 0.0 standard drinks    Comment: 2 oz scotch, 4 oz wine  . Drug use: No    Home Medications Prior to Admission medications   Medication Sig Start Date End Date Taking? Authorizing Provider  Calcium Carb-Cholecalciferol (CALCIUM 600 + D PO) Take 1 tablet by mouth daily.    [provider]  cholecalciferol (VITAMIN D) 1000 UNITS tablet Take 1,000 Units by mouth daily.    [provider]  Coenzyme Q10 200 MG capsule Take 200 mg by mouth daily.    [provider]  cyanocobalamin 500 MCG tablet Take 500 mcg by mouth daily.    [provider]  doxycycline (VIBRA-TABS) 100 MG tablet Take 100 mg by mouth 2 (two) times daily.    [provider]  Multiple Vitamins-Minerals (CENTRUM SILVER ADULT 50+ PO) Take 1 tablet by mouth daily.    [provider]  oxyCODONE-acetaminophen (PERCOCET/ROXICET) 5-325 MG per tablet Take 1-2 tablets by mouth every 4 (four) hours as needed for moderate pain. Patient not taking: Reported on 11/06/2014 07/20/13   Ronnald Ramp,  Camelia Eng, MD  simvastatin (ZOCOR) 20 MG tablet Take 20 mg by mouth 3 (three) times a week.      [provider]    Allergies    Penicillins  Review of Systems   Review of Systems  All other systems reviewed and are negative.   Physical Exam Updated Vital Signs BP (!) 165/67 (BP Location: Left Arm)   Pulse 78   Temp 98 F (36.7 C) (Oral)   Resp 18   SpO2 97%   Physical Exam Vitals and nursing note reviewed.  Constitutional:      General: He is not in acute distress.    Appearance: Normal appearance. He is well-developed and normal weight. He is not ill-appearing.  HENT:     Head: Normocephalic and atraumatic.     Comments: Vertical gaping laceration left occiput.  No associated crepitation or deformity.  No  active bleeding.  No visualized foreign body.    Right Ear: External ear normal.     Left Ear: External ear normal.  Eyes:     Conjunctiva/sclera: Conjunctivae normal.     Pupils: Pupils are equal, round, and reactive to light.  Neck:     Trachea: Phonation normal.  Cardiovascular:     Rate and Rhythm: Normal rate and regular rhythm.     Heart sounds: Normal heart sounds.  Pulmonary:     Effort: Pulmonary effort is normal.     Breath sounds: Normal breath sounds.  Musculoskeletal:        General: Normal range of motion.     Cervical back: Normal range of motion and neck supple.  Skin:    General: Skin is warm and dry.  Neurological:     Mental Status: He is alert and oriented to person, place, and time.     Cranial Nerves: No cranial nerve deficit.     Sensory: No sensory deficit.     Motor: No abnormal muscle tone.     Coordination: Coordination normal.  Psychiatric:        Mood and Affect: Mood normal.        Behavior: Behavior normal.        Thought Content: Thought content normal.        Judgment: Judgment normal.     ED Results / Procedures / Treatments   Labs (all labs ordered are listed, but only abnormal results are displayed) Labs Reviewed - No data to display  EKG None  Radiology No results found.  Procedures .Marland KitchenLaceration Repair  Date/Time: 02/13/2019 4:50 PM Performed by: Daleen Bo, MD Authorized by: Daleen Bo, MD   Consent:    Consent obtained:  Verbal   Risks discussed:  Infection   Alternatives discussed:  No treatment Anesthesia (see MAR for exact dosages):    Anesthesia method:  None Laceration details:    Location:  Scalp   Scalp location:  Occipital   Length (cm):  3.5   Depth (mm):  10 Repair type:    Repair type:  Simple Exploration:    Contaminated: no   Treatment:    Area cleansed with:  Betadine   Visualized foreign bodies/material removed: no   Approximation:    Approximation:  Loose Post-procedure details:     Dressing:  Non-adherent dressing   Patient tolerance of procedure:  Tolerated well, no immediate complications   (including critical care time)  Medications Ordered in ED Medications  Tdap (BOOSTRIX) injection 0.5 mL (0.5 mLs Intramuscular Given 02/13/19 1756)    ED  Course  I have reviewed the triage vital signs and the nursing notes.  Pertinent labs & imaging results that were available during my care of the patient were reviewed by me and considered in my medical decision making (see chart for details).    MDM Rules/Calculators/A&P                       Patient Vitals for the past 24 hrs:  BP Temp Temp src Pulse Resp SpO2  02/13/19 1614 (!) 165/67 98 F (36.7 C) Oral 78 18 97 %    6:02 PM Reevaluation with update and discussion. After initial assessment and treatment, an updated evaluation reveals no change in clinical status, findings discussed with the patient and all questions were answered. Daleen Bo   Medical Decision Making: Mechanical fall without serious injury.  Laceration stapled, to aid in closure/healing.  Doubt intracranial injury, spine injury or illness causing fall.  CRITICAL CARE-no Performed by: Daleen Bo Nursing Notes Reviewed/ Care Coordinated Applicable Imaging Reviewed Interpretation of Laboratory Data incorporated into ED treatment  The patient appears reasonably screened and/or stabilized for discharge and I doubt any other medical condition or other [facility] requiring further screening, evaluation, or treatment in the ED at this time prior to discharge.  Plan: Home Medications-continue usual medicine and use Tylenol if needed for pain; Home Treatments-wound care at home; return here if the recommended treatment, does not improve the symptoms; Recommended follow up-staple removal 7 to 10 days, return here if needed for problems.   Final Clinical Impression(s) / ED Diagnoses Final diagnoses:  Laceration of scalp, initial encounter  Injury of  head, initial encounter    Rx / DC Orders ED Discharge Orders    None       Daleen Bo, MD 02/13/19 [phone]

## 2019-06-29 ENCOUNTER — Other Ambulatory Visit: Payer: Self-pay | Admitting: Internal Medicine

## 2019-06-29 DIAGNOSIS — M48061 Spinal stenosis, lumbar region without neurogenic claudication: Secondary | ICD-10-CM

## 2019-06-30 ENCOUNTER — Other Ambulatory Visit (HOSPITAL_COMMUNITY): Payer: Self-pay | Admitting: *Deleted

## 2019-07-03 ENCOUNTER — Ambulatory Visit (HOSPITAL_COMMUNITY)
Admission: RE | Admit: 2019-07-03 | Discharge: 2019-07-03 | Disposition: A | Discharge status: Home / Self Care | Payer: Medicare PPO | Source: Ambulatory Visit | Attending: Internal Medicine | Admitting: Internal Medicine

## 2019-07-03 ENCOUNTER — Other Ambulatory Visit: Payer: Self-pay

## 2019-07-03 DIAGNOSIS — M810 Age-related osteoporosis without current pathological fracture: Secondary | ICD-10-CM | POA: Diagnosis not present

## 2019-07-03 MED ORDER — DENOSUMAB 60 MG/ML ~~LOC~~ SOSY
PREFILLED_SYRINGE | SUBCUTANEOUS | Status: AC
  Filled 2019-07-03: qty 1

## 2019-07-03 MED ORDER — DENOSUMAB 60 MG/ML ~~LOC~~ SOSY
60.0000 mg | PREFILLED_SYRINGE | Freq: Once | SUBCUTANEOUS | Status: AC
  Administered 2019-07-03: 60 mg via SUBCUTANEOUS

## 2019-07-05 DIAGNOSIS — M25572 Pain in left ankle and joints of left foot: Secondary | ICD-10-CM | POA: Diagnosis not present

## 2019-07-05 DIAGNOSIS — M25571 Pain in right ankle and joints of right foot: Secondary | ICD-10-CM | POA: Diagnosis not present

## 2019-07-13 ENCOUNTER — Other Ambulatory Visit (HOSPITAL_COMMUNITY): Payer: Self-pay | Admitting: Orthopaedic Surgery

## 2019-07-13 ENCOUNTER — Ambulatory Visit (HOSPITAL_COMMUNITY)
Admission: RE | Admit: 2019-07-13 | Discharge: 2019-07-13 | Disposition: A | Discharge status: Home / Self Care | Payer: Medicare PPO | Source: Ambulatory Visit | Attending: Orthopaedic Surgery | Admitting: Orthopaedic Surgery

## 2019-07-13 ENCOUNTER — Other Ambulatory Visit: Payer: Self-pay

## 2019-07-13 DIAGNOSIS — R52 Pain, unspecified: Secondary | ICD-10-CM

## 2019-07-13 NOTE — Progress Notes (Signed)
Lower extremity venous has been completed.   Preliminary results in CV Proc.   Abram Sander 07/13/2019 2:40 PM

## 2019-07-15 ENCOUNTER — Ambulatory Visit
Admission: RE | Admit: 2019-07-15 | Discharge: 2019-07-15 | Disposition: A | Discharge status: Home / Self Care | Payer: Medicare PPO | Source: Ambulatory Visit | Attending: Internal Medicine | Admitting: Internal Medicine

## 2019-07-15 ENCOUNTER — Other Ambulatory Visit: Payer: Self-pay

## 2019-07-15 DIAGNOSIS — M48061 Spinal stenosis, lumbar region without neurogenic claudication: Secondary | ICD-10-CM | POA: Diagnosis not present

## 2019-07-20 DIAGNOSIS — G14 Postpolio syndrome: Secondary | ICD-10-CM | POA: Diagnosis not present

## 2019-07-20 DIAGNOSIS — M48061 Spinal stenosis, lumbar region without neurogenic claudication: Secondary | ICD-10-CM | POA: Diagnosis not present

## 2019-07-20 DIAGNOSIS — E1122 Type 2 diabetes mellitus with diabetic chronic kidney disease: Secondary | ICD-10-CM | POA: Diagnosis not present

## 2019-07-20 DIAGNOSIS — K219 Gastro-esophageal reflux disease without esophagitis: Secondary | ICD-10-CM | POA: Diagnosis not present

## 2019-07-20 DIAGNOSIS — M810 Age-related osteoporosis without current pathological fracture: Secondary | ICD-10-CM | POA: Diagnosis not present

## 2019-07-20 DIAGNOSIS — M25511 Pain in right shoulder: Secondary | ICD-10-CM | POA: Diagnosis not present

## 2019-07-26 DIAGNOSIS — M19011 Primary osteoarthritis, right shoulder: Secondary | ICD-10-CM | POA: Diagnosis not present

## 2019-07-26 DIAGNOSIS — H1132 Conjunctival hemorrhage, left eye: Secondary | ICD-10-CM | POA: Diagnosis not present

## 2019-08-18 DIAGNOSIS — E1122 Type 2 diabetes mellitus with diabetic chronic kidney disease: Secondary | ICD-10-CM | POA: Diagnosis not present

## 2019-09-14 DIAGNOSIS — R269 Unspecified abnormalities of gait and mobility: Secondary | ICD-10-CM | POA: Diagnosis not present

## 2019-09-14 DIAGNOSIS — Z809 Family history of malignant neoplasm, unspecified: Secondary | ICD-10-CM | POA: Diagnosis not present

## 2019-09-14 DIAGNOSIS — E785 Hyperlipidemia, unspecified: Secondary | ICD-10-CM | POA: Diagnosis not present

## 2019-09-14 DIAGNOSIS — E119 Type 2 diabetes mellitus without complications: Secondary | ICD-10-CM | POA: Diagnosis not present

## 2019-09-14 DIAGNOSIS — Z880 Allergy status to penicillin: Secondary | ICD-10-CM | POA: Diagnosis not present

## 2019-09-14 DIAGNOSIS — Z7982 Long term (current) use of aspirin: Secondary | ICD-10-CM | POA: Diagnosis not present

## 2019-09-14 DIAGNOSIS — Z8249 Family history of ischemic heart disease and other diseases of the circulatory system: Secondary | ICD-10-CM | POA: Diagnosis not present

## 2019-09-14 DIAGNOSIS — Z8612 Personal history of poliomyelitis: Secondary | ICD-10-CM | POA: Diagnosis not present

## 2019-09-14 DIAGNOSIS — R030 Elevated blood-pressure reading, without diagnosis of hypertension: Secondary | ICD-10-CM | POA: Diagnosis not present

## 2019-09-22 DIAGNOSIS — L814 Other melanin hyperpigmentation: Secondary | ICD-10-CM | POA: Diagnosis not present

## 2019-09-22 DIAGNOSIS — C44311 Basal cell carcinoma of skin of nose: Secondary | ICD-10-CM | POA: Diagnosis not present

## 2019-09-22 DIAGNOSIS — C44612 Basal cell carcinoma of skin of right upper limb, including shoulder: Secondary | ICD-10-CM | POA: Diagnosis not present

## 2019-09-22 DIAGNOSIS — D225 Melanocytic nevi of trunk: Secondary | ICD-10-CM | POA: Diagnosis not present

## 2019-09-22 DIAGNOSIS — L570 Actinic keratosis: Secondary | ICD-10-CM | POA: Diagnosis not present

## 2019-09-22 DIAGNOSIS — L821 Other seborrheic keratosis: Secondary | ICD-10-CM | POA: Diagnosis not present

## 2019-09-22 DIAGNOSIS — Z85828 Personal history of other malignant neoplasm of skin: Secondary | ICD-10-CM | POA: Diagnosis not present

## 2019-10-16 DIAGNOSIS — Z85828 Personal history of other malignant neoplasm of skin: Secondary | ICD-10-CM | POA: Diagnosis not present

## 2019-10-16 DIAGNOSIS — C44311 Basal cell carcinoma of skin of nose: Secondary | ICD-10-CM | POA: Diagnosis not present

## 2019-11-22 DIAGNOSIS — E1122 Type 2 diabetes mellitus with diabetic chronic kidney disease: Secondary | ICD-10-CM | POA: Diagnosis not present

## 2019-11-22 DIAGNOSIS — M810 Age-related osteoporosis without current pathological fracture: Secondary | ICD-10-CM | POA: Diagnosis not present

## 2019-11-22 DIAGNOSIS — Z125 Encounter for screening for malignant neoplasm of prostate: Secondary | ICD-10-CM | POA: Diagnosis not present

## 2019-11-22 DIAGNOSIS — E785 Hyperlipidemia, unspecified: Secondary | ICD-10-CM | POA: Diagnosis not present

## 2019-11-29 DIAGNOSIS — E785 Hyperlipidemia, unspecified: Secondary | ICD-10-CM | POA: Diagnosis not present

## 2019-11-29 DIAGNOSIS — N182 Chronic kidney disease, stage 2 (mild): Secondary | ICD-10-CM | POA: Diagnosis not present

## 2019-11-29 DIAGNOSIS — I872 Venous insufficiency (chronic) (peripheral): Secondary | ICD-10-CM | POA: Diagnosis not present

## 2019-11-29 DIAGNOSIS — R3121 Asymptomatic microscopic hematuria: Secondary | ICD-10-CM | POA: Diagnosis not present

## 2019-11-29 DIAGNOSIS — M810 Age-related osteoporosis without current pathological fracture: Secondary | ICD-10-CM | POA: Diagnosis not present

## 2019-11-29 DIAGNOSIS — B351 Tinea unguium: Secondary | ICD-10-CM | POA: Diagnosis not present

## 2019-11-29 DIAGNOSIS — E1122 Type 2 diabetes mellitus with diabetic chronic kidney disease: Secondary | ICD-10-CM | POA: Diagnosis not present

## 2019-11-29 DIAGNOSIS — Z0000 Encounter for general adult medical examination without abnormal findings: Secondary | ICD-10-CM | POA: Diagnosis not present

## 2019-11-29 DIAGNOSIS — M48061 Spinal stenosis, lumbar region without neurogenic claudication: Secondary | ICD-10-CM | POA: Diagnosis not present

## 2019-11-29 DIAGNOSIS — R82998 Other abnormal findings in urine: Secondary | ICD-10-CM | POA: Diagnosis not present

## 2020-01-22 DIAGNOSIS — Z85828 Personal history of other malignant neoplasm of skin: Secondary | ICD-10-CM | POA: Diagnosis not present

## 2020-01-22 DIAGNOSIS — L814 Other melanin hyperpigmentation: Secondary | ICD-10-CM | POA: Diagnosis not present

## 2020-01-22 DIAGNOSIS — L821 Other seborrheic keratosis: Secondary | ICD-10-CM | POA: Diagnosis not present

## 2020-01-22 DIAGNOSIS — D692 Other nonthrombocytopenic purpura: Secondary | ICD-10-CM | POA: Diagnosis not present

## 2020-01-22 DIAGNOSIS — L570 Actinic keratosis: Secondary | ICD-10-CM | POA: Diagnosis not present

## 2020-01-30 ENCOUNTER — Encounter (INDEPENDENT_AMBULATORY_CARE_PROVIDER_SITE_OTHER): Payer: Medicare PPO | Admitting: Ophthalmology

## 2020-01-31 ENCOUNTER — Other Ambulatory Visit: Payer: Self-pay

## 2020-01-31 ENCOUNTER — Encounter (INDEPENDENT_AMBULATORY_CARE_PROVIDER_SITE_OTHER): Payer: Self-pay | Admitting: Ophthalmology

## 2020-01-31 ENCOUNTER — Ambulatory Visit (INDEPENDENT_AMBULATORY_CARE_PROVIDER_SITE_OTHER): Payer: Medicare PPO | Admitting: Ophthalmology

## 2020-01-31 DIAGNOSIS — Z9889 Other specified postprocedural states: Secondary | ICD-10-CM | POA: Diagnosis not present

## 2020-01-31 DIAGNOSIS — H26492 Other secondary cataract, left eye: Secondary | ICD-10-CM | POA: Insufficient documentation

## 2020-01-31 DIAGNOSIS — Z961 Presence of intraocular lens: Secondary | ICD-10-CM | POA: Diagnosis not present

## 2020-01-31 DIAGNOSIS — H35371 Puckering of macula, right eye: Secondary | ICD-10-CM

## 2020-01-31 DIAGNOSIS — H43812 Vitreous degeneration, left eye: Secondary | ICD-10-CM | POA: Insufficient documentation

## 2020-01-31 NOTE — Assessment & Plan Note (Signed)

## 2020-01-31 NOTE — Assessment & Plan Note (Signed)
Resolved as of September 2019, residual foveal thickening remains however

## 2020-01-31 NOTE — Progress Notes (Signed)
01/31/2020     CHIEF COMPLAINT Patient presents for Retina Follow Up   HISTORY OF PRESENT ILLNESS: Eddie Sanders is a 86 y.o. male who presents to the clinic today for:   HPI    Retina Follow Up    Diagnosis: ERM.  In right eye.  Severity is moderate.  Duration of 1 year.  Since onset it is stable.  I, the attending physician,  performed the HPI with the patient and updated documentation appropriately.          Comments    1 Year f\u OU. OCT  Pt states vision has been stable. Denies FOL and floaters.       Last edited by Tilda Franco on 01/31/2020 10:03 AM. (History)      Referring physician: Crist Infante, MD [place],  [place] [id number]  HISTORICAL INFORMATION:   Selected notes from the [facility]: No current outpatient medications on file. (Ophthalmic Drugs)   No current facility-administered medications for this visit. (Ophthalmic Drugs)   Current Outpatient Medications (Other)  Medication Sig  . Calcium Carb-Cholecalciferol (CALCIUM 600 + D PO) Take 1 tablet by mouth daily.  . cholecalciferol (VITAMIN D) 1000 UNITS tablet Take 1,000 Units by mouth daily.  . Coenzyme Q10 200 MG capsule Take 200 mg by mouth daily.  . cyanocobalamin 500 MCG tablet Take 500 mcg by mouth daily.  Marland Kitchen doxycycline (VIBRA-TABS) 100 MG tablet Take 100 mg by mouth 2 (two) times daily.  . Multiple Vitamins-Minerals (CENTRUM SILVER ADULT 50+ PO) Take 1 tablet by mouth daily.  Marland Kitchen oxyCODONE-acetaminophen (PERCOCET/ROXICET) 5-325 MG per tablet Take 1-2 tablets by mouth every 4 (four) hours as needed for moderate pain. (Patient not taking: Reported on 11/06/2014)  . simvastatin (ZOCOR) 20 MG tablet Take 20 mg by mouth 3 (three) times a week.     No current facility-administered medications for this visit. (Other)      REVIEW OF SYSTEMS:    ALLERGIES Allergies  Allergen Reactions  . Penicillins     REACTION: hives, rash,  swelling    PAST MEDICAL HISTORY Past Medical History:  Diagnosis Date  . Adenomatous colon polyp   . Allergic rhinitis   . Arthritis   . GERD (gastroesophageal reflux disease)   . Hyperlipidemia   . Polio 1939  . Post-polio syndrome   . Prostate cancer ([facility])   . Skin cancer (melanoma) ([place] [facility])    Right eye   Past Surgical History:  Procedure Laterality Date  . cataract surgery     bilateral  . LUMBAR EPIDURAL INJECTION    . LUMBAR LAMINECTOMY/DECOMPRESSION MICRODISCECTOMY Right 07/19/2013   Procedure: LUMBAR LAMINECTOMY/DECOMPRESSION MICRODISCECTOMY 2 LEVELS     lumbar  three/four,  four/five;  Surgeon: Eustace Moore, MD;  Location: [facility];  Service: Neurosurgery;  Laterality: Right;  . PROSTATECTOMY    . sigmoid colectomy    . TONSILLECTOMY      FAMILY HISTORY Family History  Problem Relation Age of Onset  . Pancreatic cancer Father   . Cancer Father   . Heart failure Mother   . Heart disease Mother   . Colon cancer Neg Hx     SOCIAL HISTORY Social History   Tobacco Use  . Smoking status: Current Some Day Smoker    Years: 30.00    Types: Cigars  . Smokeless tobacco: Never Used  . Tobacco comment: has occasional cigar ;stopped cigarettes in  1965  Substance Use Topics  . Alcohol use: Yes    Alcohol/week: 0.0 standard drinks    Comment: 2 oz scotch, 4 oz wine  . Drug use: No         OPHTHALMIC EXAM:  Base Eye Exam    Visual Acuity (Snellen - Linear)      Right Left   Dist [place] 20/100 20/60   Dist ph [place] 20/60 -2 20/30 -2       Tonometry (Tonopen, 10:11 AM)      Right Left   Pressure 15 15       Pupils      Pupils Dark Light Shape React APD   Right PERRL 3 3 Round Minimal None   Left PERRL 3 3 Round Minimal None       Visual Fields (Counting fingers)      Left Right     Full   Restrictions Partial outer superior temporal deficiency        Neuro/Psych    Oriented x3: Yes   Mood/Affect: Normal       Dilation    Both eyes: 1.0%  Mydriacyl, 2.5% Phenylephrine @ 10:12 AM        Slit Lamp and Fundus Exam    External Exam      Right Left   External Normal Normal       Slit Lamp Exam      Right Left   Lids/Lashes Normal Normal   Conjunctiva/Sclera White and quiet White and quiet   Cornea Clear Clear   Anterior Chamber Deep and quiet Deep and quiet   Iris Round and reactive Round and reactive   Lens Centered posterior chamber intraocular lens, Open posterior capsule Centered posterior chamber intraocular lens, Open posterior capsule   Anterior Vitreous Normal Normal       Fundus Exam      Right Left   Posterior Vitreous Vitrectomized Posterior vitreous detachment   Disc Normal Normal   C/D Ratio 0.15 0.3   Macula Normal Normal   Vessels Normal Normal   Periphery Normal Normal          IMAGING AND PROCEDURES  Imaging and Procedures for 01/31/20  OCT, Retina - OU - Both Eyes       Right Eye Quality was good. Scan locations included subfoveal. Central Foveal Thickness: 414. Progression has improved. Findings include abnormal foveal contour.   Left Eye Quality was good. Scan locations included subfoveal. Central Foveal Thickness: 299. Progression has been stable. Findings include normal foveal contour.   Notes OD status post vitrectomy membrane peel for epiretinal membrane, September 2019 stable and improved  OS, incidental posterior vitreous detachment normal fovea                ASSESSMENT/PLAN:  Right epiretinal membrane Resolved as of September 2019, residual foveal thickening remains however  Posterior capsular opacification visually significant of left eye Appears resolved OS  Posterior vitreous detachment of left eye   The nature of posterior vitreous detachment was discussed with the patient as well as its physiology, its age prevalence, and its possible implication regarding retinal breaks and detachment.  An informational brochure was given to the patient.  All the  patient's questions were answered.  The patient was asked to return if new or different flashes or floaters develops.   Patient was instructed to contact office immediately if any changes were noticed. I explained to the patient that vitreous inside the eye is similar to jello  inside a bowl. As the jello melts it can start to pull away from the bowl, similarly the vitreous throughout our lives can begin to pull away from the retina. That process is called a posterior vitreous detachment. In some cases, the vitreous can tug hard enough on the retina to form a retinal tear. I discussed with the patient the signs and symptoms of a retinal detachment.  Do not rub the eye.      ICD-10-CM   1. Right epiretinal membrane  H35.371 OCT, Retina - OU - Both Eyes  2. Pseudophakia, both eyes  Z96.1   3. Posterior capsular opacification visually significant of left eye  H26.492   4. History of vitrectomy  Z98.890   5. Posterior vitreous detachment of left eye  H43.812     1.  Follow-up Dr. Omelia Blackwater of [facility]  2.  We would be delighted to see Mr. Desire anytime in the future if retinal difficulties were to develop, or if this [place] man would like to visit the [place] man  3.  Ophthalmic Meds Ordered this visit:  No orders of the defined types were placed in this encounter.      Return if symptoms worsen or fail to improve, or as per Dr. Jola Schmidt.  There are no Patient Instructions on file for this visit.   Explained the diagnoses, plan, and follow up with the patient and they expressed understanding.  Patient expressed understanding of the importance of proper follow up care.   Clent Demark Lamyia Cdebaca M.D. Diseases & Surgery of the Retina and Vitreous [facility] 01/31/20     Abbreviations: M myopia (nearsighted); A astigmatism; H hyperopia (farsighted); P presbyopia; Mrx spectacle prescription;  CTL contact lenses; OD right eye; OS left eye;  OU both eyes  XT exotropia; ET esotropia; PEK punctate epithelial keratitis; PEE punctate epithelial erosions; DES dry eye syndrome; MGD meibomian gland dysfunction; ATs artificial tears; PFAT's preservative free artificial tears; [place] nuclear sclerotic cataract; PSC posterior subcapsular cataract; ERM epi-retinal membrane; PVD posterior vitreous detachment; RD retinal detachment; DM diabetes mellitus; DR diabetic retinopathy; NPDR non-proliferative diabetic retinopathy; PDR proliferative diabetic retinopathy; CSME clinically significant macular edema; DME diabetic macular edema; dbh dot blot hemorrhages; CWS cotton wool spot; POAG primary open angle glaucoma; C/D cup-to-disc ratio; HVF humphrey visual field; GVF goldmann visual field; OCT optical coherence tomography; IOP intraocular pressure; BRVO Branch retinal vein occlusion; CRVO central retinal vein occlusion; CRAO central retinal artery occlusion; BRAO branch retinal artery occlusion; RT retinal tear; SB scleral buckle; PPV pars plana vitrectomy; VH Vitreous hemorrhage; PRP panretinal laser photocoagulation; IVK intravitreal kenalog; VMT vitreomacular traction; MH Macular hole;  NVD neovascularization of the disc; NVE neovascularization elsewhere; AREDS age related eye disease study; ARMD age related macular degeneration; POAG primary open angle glaucoma; EBMD epithelial/anterior basement membrane dystrophy; ACIOL anterior chamber intraocular lens; IOL intraocular lens; PCIOL posterior chamber intraocular lens; Phaco/IOL phacoemulsification with intraocular lens placement; [place] photorefractive keratectomy; LASIK laser assisted in situ keratomileusis; HTN hypertension; DM diabetes mellitus; COPD chronic obstructive pulmonary disease

## 2020-01-31 NOTE — Assessment & Plan Note (Signed)
Appears resolved OS 

## 2020-02-01 ENCOUNTER — Other Ambulatory Visit (HOSPITAL_COMMUNITY): Payer: Self-pay

## 2020-02-02 ENCOUNTER — Ambulatory Visit (HOSPITAL_COMMUNITY)
Admission: RE | Admit: 2020-02-02 | Discharge: 2020-02-02 | Disposition: A | Discharge status: Home / Self Care | Payer: Medicare PPO | Source: Ambulatory Visit | Attending: Internal Medicine | Admitting: Internal Medicine

## 2020-02-02 ENCOUNTER — Other Ambulatory Visit: Payer: Self-pay

## 2020-02-02 DIAGNOSIS — M810 Age-related osteoporosis without current pathological fracture: Secondary | ICD-10-CM | POA: Diagnosis not present

## 2020-02-02 MED ORDER — DENOSUMAB 60 MG/ML ~~LOC~~ SOSY
PREFILLED_SYRINGE | SUBCUTANEOUS | Status: AC
  Filled 2020-02-02: qty 1

## 2020-02-02 MED ORDER — DENOSUMAB 60 MG/ML ~~LOC~~ SOSY
60.0000 mg | PREFILLED_SYRINGE | Freq: Once | SUBCUTANEOUS | Status: AC
  Administered 2020-02-02: 60 mg via SUBCUTANEOUS

## 2020-02-21 DIAGNOSIS — M19011 Primary osteoarthritis, right shoulder: Secondary | ICD-10-CM | POA: Diagnosis not present

## 2020-02-21 DIAGNOSIS — M19012 Primary osteoarthritis, left shoulder: Secondary | ICD-10-CM | POA: Diagnosis not present

## 2020-04-09 DIAGNOSIS — M79671 Pain in right foot: Secondary | ICD-10-CM | POA: Diagnosis not present

## 2020-04-26 DIAGNOSIS — Z8546 Personal history of malignant neoplasm of prostate: Secondary | ICD-10-CM | POA: Diagnosis not present

## 2020-05-01 DIAGNOSIS — E1122 Type 2 diabetes mellitus with diabetic chronic kidney disease: Secondary | ICD-10-CM | POA: Diagnosis not present

## 2020-05-01 DIAGNOSIS — M810 Age-related osteoporosis without current pathological fracture: Secondary | ICD-10-CM | POA: Diagnosis not present

## 2020-05-01 DIAGNOSIS — G14 Postpolio syndrome: Secondary | ICD-10-CM | POA: Diagnosis not present

## 2020-05-01 DIAGNOSIS — R400 Somnolence: Secondary | ICD-10-CM | POA: Diagnosis not present

## 2020-05-01 DIAGNOSIS — M6281 Muscle weakness (generalized): Secondary | ICD-10-CM | POA: Diagnosis not present

## 2020-05-01 DIAGNOSIS — M25511 Pain in right shoulder: Secondary | ICD-10-CM | POA: Diagnosis not present

## 2020-05-01 DIAGNOSIS — E785 Hyperlipidemia, unspecified: Secondary | ICD-10-CM | POA: Diagnosis not present

## 2020-05-03 DIAGNOSIS — N393 Stress incontinence (female) (male): Secondary | ICD-10-CM | POA: Diagnosis not present

## 2020-05-03 DIAGNOSIS — N200 Calculus of kidney: Secondary | ICD-10-CM | POA: Diagnosis not present

## 2020-05-03 DIAGNOSIS — N3040 Irradiation cystitis without hematuria: Secondary | ICD-10-CM | POA: Diagnosis not present

## 2020-05-03 DIAGNOSIS — C61 Malignant neoplasm of prostate: Secondary | ICD-10-CM | POA: Diagnosis not present

## 2020-05-07 DIAGNOSIS — B351 Tinea unguium: Secondary | ICD-10-CM | POA: Diagnosis not present

## 2020-05-07 DIAGNOSIS — E119 Type 2 diabetes mellitus without complications: Secondary | ICD-10-CM | POA: Diagnosis not present

## 2020-05-07 DIAGNOSIS — M79672 Pain in left foot: Secondary | ICD-10-CM | POA: Diagnosis not present

## 2020-05-07 DIAGNOSIS — M79671 Pain in right foot: Secondary | ICD-10-CM | POA: Diagnosis not present

## 2020-05-20 DIAGNOSIS — Z961 Presence of intraocular lens: Secondary | ICD-10-CM | POA: Diagnosis not present

## 2020-05-20 DIAGNOSIS — H524 Presbyopia: Secondary | ICD-10-CM | POA: Diagnosis not present

## 2020-05-22 DIAGNOSIS — C44622 Squamous cell carcinoma of skin of right upper limb, including shoulder: Secondary | ICD-10-CM | POA: Diagnosis not present

## 2020-05-22 DIAGNOSIS — Z85828 Personal history of other malignant neoplasm of skin: Secondary | ICD-10-CM | POA: Diagnosis not present

## 2020-05-22 DIAGNOSIS — D1801 Hemangioma of skin and subcutaneous tissue: Secondary | ICD-10-CM | POA: Diagnosis not present

## 2020-05-22 DIAGNOSIS — L570 Actinic keratosis: Secondary | ICD-10-CM | POA: Diagnosis not present

## 2020-05-22 DIAGNOSIS — L814 Other melanin hyperpigmentation: Secondary | ICD-10-CM | POA: Diagnosis not present

## 2020-05-22 DIAGNOSIS — C4441 Basal cell carcinoma of skin of scalp and neck: Secondary | ICD-10-CM | POA: Diagnosis not present

## 2020-05-22 DIAGNOSIS — L821 Other seborrheic keratosis: Secondary | ICD-10-CM | POA: Diagnosis not present

## 2020-06-26 ENCOUNTER — Other Ambulatory Visit (HOSPITAL_COMMUNITY): Payer: Self-pay | Admitting: *Deleted

## 2020-06-27 ENCOUNTER — Other Ambulatory Visit: Payer: Self-pay

## 2020-06-27 ENCOUNTER — Ambulatory Visit (HOSPITAL_COMMUNITY)
Admission: RE | Admit: 2020-06-27 | Discharge: 2020-06-27 | Disposition: A | Discharge status: Home / Self Care | Payer: Medicare PPO | Source: Ambulatory Visit | Attending: Internal Medicine | Admitting: Internal Medicine

## 2020-06-27 DIAGNOSIS — M810 Age-related osteoporosis without current pathological fracture: Secondary | ICD-10-CM | POA: Insufficient documentation

## 2020-06-27 MED ORDER — DENOSUMAB 60 MG/ML ~~LOC~~ SOSY
PREFILLED_SYRINGE | SUBCUTANEOUS | Status: AC
  Filled 2020-06-27: qty 1

## 2020-06-27 MED ORDER — DENOSUMAB 60 MG/ML ~~LOC~~ SOSY
60.0000 mg | PREFILLED_SYRINGE | Freq: Once | SUBCUTANEOUS | Status: AC
  Administered 2020-06-27: 60 mg via SUBCUTANEOUS

## 2020-07-08 ENCOUNTER — Encounter: Payer: Self-pay | Admitting: Neurology

## 2020-07-08 ENCOUNTER — Ambulatory Visit: Payer: Medicare PPO | Admitting: Neurology

## 2020-07-08 VITALS — BP 138/80 | HR 71 | Ht 71.0 in | Wt 183.0 lb

## 2020-07-08 DIAGNOSIS — H26492 Other secondary cataract, left eye: Secondary | ICD-10-CM

## 2020-07-08 DIAGNOSIS — M79604 Pain in right leg: Secondary | ICD-10-CM

## 2020-07-08 DIAGNOSIS — H43812 Vitreous degeneration, left eye: Secondary | ICD-10-CM

## 2020-07-08 DIAGNOSIS — G4761 Periodic limb movement disorder: Secondary | ICD-10-CM

## 2020-07-08 DIAGNOSIS — G4719 Other hypersomnia: Secondary | ICD-10-CM

## 2020-07-08 DIAGNOSIS — R5383 Other fatigue: Secondary | ICD-10-CM | POA: Diagnosis not present

## 2020-07-08 DIAGNOSIS — G14 Postpolio syndrome: Secondary | ICD-10-CM | POA: Diagnosis not present

## 2020-07-08 NOTE — Patient Instructions (Signed)
[email or web address]  Post-Polio Syndrome Post-polio syndrome is a condition that develops 15 years or more after recovery from polio. Polio is also called poliomyelitis. Symptoms include muscle weakness, joint pain, and tiredness (fatigue). Post-polio syndrome is not as dangerous as polio, but it can be severe enough to affect quality of life. This condition cannot be prevented. What are the causes? The cause of this condition is not known. It may be caused by one of the following:  Brain damage from a poliovirus infection.  Overworked nerve cells in the brain and spinal cord. What increases the risk? You are more likely to develop this condition if:  You had severe polio.  You are a woman who had polio. What are the signs or symptoms? Signs and symptoms of post-polio syndrome can start 15 years or more after recovery from polio. Symptoms start gradually and can vary greatly from person to person. Symptoms may include:  Weakness in muscles, especially those that were affected by polio before.  Difficulty with walking, breathing, swallowing, or standing up straight.  Fatigue.  Shrinking of muscles (atrophy).  Muscle aches or joint pain.  Shallow breathing or pauses in breathing during sleep (sleep apnea).  Difficulty tolerating cold. How is this diagnosed? This condition is based on your medical history and results of a physical exam. You may be diagnosed with post-polio syndrome if you:  Had partial or complete recovery from polio 15 years ago or more.  Have new symptoms of muscle weakness or fatigue that slowly get worse and do not go away.  Have symptoms of post-polio syndrome for at least one year.  Have no other explanation for your symptoms. Tests may be done to rule out other problems that could be causing your symptoms. These tests may include:  CT scans.  MRI scans.  A biopsy.  This is removal of a sample of muscle tissue to be looked at under a microscope.  A spinal tap, or lumbar puncture. This is removal of a sample of fluid from the spinal cord to be examined under a microscope.  Electromyography (EMG) study. This test measures electrical activity or muscle response after nerves are stimulated. How is this treated? There is no cure for this condition, but treatment can help to manage your symptoms and strengthen your muscles without wearing them out. Treatment may include:  Avoiding activities that cause severe muscle fatigue. This includes activities that cause muscle pain or fatigue that lasts longer than 10 minutes.  Using devices to help you move around (mobility aids), such as walkers, canes, or crutches.  Using ventilation equipment to help with breathing problems.  Doing exercises to increase your heart rate (moderate-intensity exercise), such as swimming.  Using anti-inflammatory medicines, such as aspirin, to help with pain.  Working with a team of rehabilitation therapists. This can include: ? A physical therapist, to help you develop and maintain the right exercise routine. ? A speech therapist, to help you with swallowing problems. ? An occupational therapist, to help adjust your home environment to make activities easier for you.   Follow these instructions at home: Activity  Pace yourself to save energy during the day. ? Break down big tasks into smaller tasks. ? Rest between tasks. ? Use mobility aids to save energy.  Exercise as told by your health care provider or rehabilitation therapists. ? Exercise enough to strengthen your muscles but not tire them. ? Do not do any types of exercise that cause weakness, fatigue, or aching. These include activities that  cause muscle pain or fatigue that lasts longer than 10 minutes. Lifestyle  Eat a healthy diet that includes plenty of vegetables, fruits, whole grains, low-fat dairy products, and  lean protein.  Maintain a healthy weight. If you are overweight, ask your health care provider to help you with a weight loss program.  Get plenty of rest. Aim for 7-9 hours of sleep every night.  Do not use any products that contain nicotine or tobacco, such as cigarettes, e-cigarettes, and chewing tobacco. If you need help quitting, ask your health care provider.      General instructions  Work closely with your health care providers and rehabilitation therapists. If you are struggling at home, ask your health care provider about services that may be available to help you.  Take over-the-counter and prescription medicines only as told by your health care provider. Learn about what side effects to watch for and when to call your health care provider.  Have a good support system at home. Ask friends and family to help you with everyday tasks.  Keep all follow-up visits as told by your health care provider. This is important. Where to find more information  The [place]: www.christopherreeve.org  Centers for Disease Control and Prevention: http://www.wolf.info/ Contact a health care provider if:  You have new symptoms.  Your symptoms get worse.  You are coughing and choking during or after meals.  You need more support at home. Get help right away if you:  Can no longer care for yourself safely at home.  Are having trouble breathing.  Cannot swallow.  Have chest pain. These symptoms may represent a serious problem that is an emergency. Do not wait to see if the symptoms will go away. Get medical help right away. Call your local emergency services (911 in the [place] Do not drive yourself to the hospital. Summary  Post-polio syndrome is a condition that develops 15 years or more after recovery from polio. This syndrome is not as dangerous as polio, but it can be severe enough to affect quality of life.  There is no cure for this condition, but treatment can  help manage your symptoms and strengthen your muscles without wearing them out.  Do not do activities that cause muscle pain or tiredness (fatigue) that lasts longer than 10 minutes. Work with your health care providers to use exercises, activities, and mobility aids (such as a walker, cane, or crutches) to avoid muscle fatigue.  Work closely with your health care providers and rehabilitation therapists. If you are struggling at home, ask your health care provider about services to help you. This information is not intended to replace advice given to you by your health care provider. Make sure you discuss any questions you have with your health care provider. Document Revised: 10/20/2018 Document Reviewed: 10/20/2018 Elsevier Patient Education  2021 [place]. Fatigue If you have fatigue, you feel tired all the time and have a lack of energy or a lack of motivation. Fatigue may make it difficult to start or complete tasks because of exhaustion. In general, occasional or mild fatigue is often a normal response to activity or life. However, long-lasting (chronic) or extreme fatigue may be a symptom of a medical condition. Follow these instructions at home: General instructions  Watch your fatigue for any changes.  Go to bed and get up at the same time every day.  Avoid fatigue by pacing yourself during the day and getting enough sleep at night.  Maintain  a healthy weight. Medicines  Take over-the-counter and prescription medicines only as told by your health care provider.  Take a multivitamin, if told by your health care provider.  Do not use herbal or dietary supplements unless they are approved by your health care provider. Activity  Exercise regularly, as told by your health care provider.  Use or practice techniques to help you relax, such as yoga, tai chi, meditation, or massage therapy.   Eating and drinking  Avoid heavy meals in the evening.  Eat a well-balanced diet,  which includes lean proteins, whole grains, plenty of fruits and vegetables, and low-fat dairy products.  Avoid consuming too much caffeine.  Avoid the use of alcohol.  Drink enough fluid to keep your urine pale yellow.   Lifestyle  Change situations that cause you stress. Try to keep your work and personal schedule in balance.  Do not use any products that contain nicotine or tobacco, such as cigarettes and e-cigarettes. If you need help quitting, ask your health care provider.  Do not use drugs. Contact a health care provider if:  Your fatigue does not get better.  You have a fever.  You suddenly lose or gain weight.  You have headaches.  You have trouble falling asleep or sleeping through the night.  You feel angry, guilty, anxious, or sad.  You are unable to have a bowel movement (constipation).  Your skin is dry.  You have swelling in your legs or another part of your body. Get help right away if:  You feel confused.  Your vision is blurry.  You feel faint or you pass out.  You have a severe headache.  You have severe pain in your abdomen, your back, or the area between your waist and hips (pelvis).  You have chest pain, shortness of breath, or an irregular or fast heartbeat.  You are unable to urinate, or you urinate less than normal.  You have abnormal bleeding, such as bleeding from the rectum, vagina, nose, lungs, or nipples.  You vomit blood.  You have thoughts about hurting yourself or others. If you ever feel like you may hurt yourself or others, or have thoughts about taking your own life, get help right away. You can go to your nearest emergency department or call:  Your local emergency services (911 in the [place]  A suicide crisis helpline, such as the [facility] at 807-820-8352. This is open 24 hours a day. Summary  If you have fatigue, you feel tired all the time and have a lack of energy or a lack of  motivation.  Fatigue may make it difficult to start or complete tasks because of exhaustion.  Long-lasting (chronic) or extreme fatigue may be a symptom of a medical condition.  Exercise regularly, as told by your health care provider.  Change situations that cause you stress. Try to keep your work and personal schedule in balance. This information is not intended to replace advice given to you by your health care provider. Make sure you discuss any questions you have with your health care provider. Document Revised: 08/24/2018 Document Reviewed: 10/28/2016 Elsevier Patient Education  2021 [employer].

## 2020-07-08 NOTE — Progress Notes (Signed)
SLEEP MEDICINE CLINIC    Provider:  Melvyn Novas, MD  Primary Care Physician:  Rodrigo Ran, MD [address] [place] [address]     Referring Provider: Rodrigo Ran, Md [address] [place],  [place] [address]          Chief Complaint according to patient   Patient presents with:    . New Patient (Initial Visit)      RN : Presents today for concerns of falling asleep when he shouldn't be. States that he will doze off at a concert or could be talking to friends and nod off.  He feels he avg 6 hrs of sleep. Sometimes can take a while before he falls asleep, and sleep is fragmented because of arthritis pain in right shoulder and right hip and leg.      HISTORY OF PRESENT ILLNESS:  Eddie Sanders is a 23 - year- old Caucasian male patient and was seen upon referral by Dr Waynard Edwards on 07/08/2020 for a sleep consultation. He reports an increasing degree of daytime sleepiness.  Chief concern according to patient : Presents today for concerns of falling asleep when he shouldn't be. States that he will doze off at a concert or could be talking to friends and nod off. His legs give way, right more than left.     Bevely Palmer  has a past medical history of Adenomatous colon polyp, Allergic rhinitis, Osteoarthritis, GERD (gastroesophageal reflux disease), Hyperlipidemia, tonsillectomy and adenoid ectomy in 1939, Poliomyelitis in 1939, Post-polio syndrome, Gait Disorder since 1964,   Prostate cancer (HCC), and Skin cancer (melanoma) (HCC).    Sleep relevant medical history:  Tonsillectomy, post polio .    Family medical /sleep history: no other family member on CPAP with OSA, insomnia, sleep walkers.    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  and lives in a [place], [place] .  Family status is married , wife is one year older, with one daughter 28 46) and  adult  68 and 86 year old grandchildren, several  great-grandchildren. .  The patient currently smokes cigars ( smoked one box a year- that's 25 a year) and none since 2016. Tobacco use- remote.  ETOH use - 3 ounces wine rarely ,one scotch a day at 4-5 Pm, 1.5 ounces. Caffeine intake in form of Coffee( 2 cups of black coffee in AM ) .Regular exercise in form of swimming, [employer],  Now at [employer].  Hobbies : Reading and Movies.  Sleep habits are as follows: The patient's dinner time is between 5-7 PM. The patient goes to bed at 11 PM and continues to sleep for 5-6.5 hours, wakes for some bathroom breaks.   The preferred sleep position is supine on a knee pillow and footrest pillow, with the support of 3 pillows.  Dreams are reportedly rare now.  6-7  AM is the usual rise time.  The patient wakes up spontaneously.  He  reports not feeling refreshed or restored in AM, with symptoms such as dry mouth and residual fatigue.  Naps are taken frequently, unscheduled , and lasting from 5-30 minutes and are more refreshing than nocturnal sleep.    Review of Systems: Out of a complete 14 system review, the patient complains of only the following symptoms, and all other reviewed systems are negative.:  Fatigue, sleepiness , snoring, fragmented sleep, hypersomnia.    How likely are you to doze in  the following situations: 0 = not likely, 1 = slight chance, 2 = moderate chance, 3 = high chance   Sitting and Reading? Watching Television? Sitting inactive in a public place (theater or meeting)? As a passenger in a car for an hour without a break? Lying down in the afternoon when circumstances permit? Sitting and talking to someone? Sitting quietly after lunch without alcohol? In a car, while stopped for a few minutes in traffic?   Total = 15/ 24 points   FSS endorsed at 41/ 63 points.   Social History   Socioeconomic History  . Marital status: Married    Spouse name: Not on file  . Number of children: 1  . Years of education: Not on file  .  Highest education level: Not on file  Occupational History  . Occupation: retired  Tobacco Use  . Smoking status: Current Some Day Smoker    Years: 30.00    Types: Cigars  . Smokeless tobacco: Never Used  . Tobacco comment: has occasional cigar ;stopped cigarettes in 1965  Substance and Sexual Activity  . Alcohol use: Yes    Alcohol/week: 0.0 standard drinks    Comment: 2 oz scotch, 4 oz wine  . Drug use: No  . Sexual activity: Not on file  Other Topics Concern  . Not on file  Social History Narrative  . Not on file   Social Determinants of Health   Financial Resource Strain: Not on file  Food Insecurity: Not on file  Transportation Needs: Not on file  Physical Activity: Not on file  Stress: Not on file  Social Connections: Not on file    Family History  Problem Relation Age of Onset  . Pancreatic cancer Father   . Cancer Father   . Heart failure Mother   . Heart disease Mother   . Colon cancer Neg Hx     Past Medical History:  Diagnosis Date  . Adenomatous colon polyp   . Allergic rhinitis   . Arthritis   . GERD (gastroesophageal reflux disease)   . Hyperlipidemia   . Polio 1939  . Post-polio syndrome   . Prostate cancer ([place])   . Skin cancer (melanoma) ([place])    Right eye    Past Surgical History:  Procedure Laterality Date  . cataract surgery     bilateral  . LUMBAR EPIDURAL INJECTION    . LUMBAR LAMINECTOMY/DECOMPRESSION MICRODISCECTOMY Right 07/19/2013   Procedure: LUMBAR LAMINECTOMY/DECOMPRESSION MICRODISCECTOMY 2 LEVELS     lumbar  three/four,  four/five;  Surgeon: Eustace Moore, MD;  Location: [facility];  Service: Neurosurgery;  Laterality: Right;  . PROSTATECTOMY    . sigmoid colectomy    . TONSILLECTOMY       Current Outpatient Medications on File Prior to Visit  Medication Sig Dispense Refill  . Calcium Carb-Cholecalciferol (CALCIUM 600 + D PO) Take 1 tablet by mouth daily.    . cholecalciferol (VITAMIN D) 1000 UNITS tablet Take 1,000  Units by mouth daily.    . Coenzyme Q10 200 MG capsule Take 200 mg by mouth daily.    . cyanocobalamin 500 MCG tablet Take 500 mcg by mouth daily.    . Multiple Vitamins-Minerals (CENTRUM SILVER ADULT 50+ PO) Take 1 tablet by mouth daily.    . simvastatin (ZOCOR) 20 MG tablet Take 20 mg by mouth 3 (three) times a week.     No current facility-administered medications on file prior to visit.    Allergies  Allergen Reactions  .  Penicillins     REACTION: hives, rash, swelling    Physical exam:  Today's Vitals   07/08/20 1301  BP: 138/80  Pulse: 71  Weight: 183 lb (83 kg)  Height: 5\' 11"  (1.803 m)   Body mass index is 25.52 kg/m.   Wt Readings from Last 3 Encounters:  07/08/20 183 lb (83 kg)  11/06/14 184 lb (83.5 kg)  06/05/14 171 lb (77.6 kg)     Ht Readings from Last 3 Encounters:  07/08/20 5\' 11"  (1.803 m)  11/06/14 5\' 11"  (1.803 m)  06/05/14 5\' 11"  (1.803 m)      General: The patient is awake, alert and appears not in acute distress. The patient is well groomed. Head: Normocephalic, atraumatic. Neck is supple. Mallampati 2,  neck circumference: 17 inches .  Nasal airflow  patent.  Retrognathia is not seen.  Dental status: lower dentures.  Cardiovascular:  Regular rate and cardiac rhythm by pulse,  without distended neck veins. Respiratory: Lungs are clear to auscultation.  Skin:  With evidence of ankle edema, not rash.  Trunk: The patient's seated posture is erect.   Neurologic exam : The patient is awake and alert, oriented to place and time.   Memory subjective described as intact.  Attention span & concentration ability appears normal.  Speech is fluent,  without  dysarthria, dysphonia or aphasia.  Mood and affect are appropriate.   Cranial nerves: no loss of smell or taste reported  Pupils are equal and briskly reactive to light. Left ptosis, cataract bilaterally removed, he had a macular pucker and epimembranous change on the right. Detachment of left  lens, funduscopic exam deferred.   Extraocular movements in vertical and horizontal planes were intact and without nystagmus. No Diplopia. Visual fields by finger perimetry are intact. Hearing was intact to soft voice and finger rubbing.    Facial sensation intact to fine touch.  Facial motor strength is symmetric and tongue and uvula move midline.  Neck ROM : rotation, tilt and flexion extension were normal for age and shoulder shrug was symmetrical.    Motor exam:  right quadriceps is smaller than left  Right leg muscle mass is smaller.  Normal tone without cog -wheeling, symmetric grip strength .   Sensory:  Fine touch  and vibration were not felt in either knee or ankles.  Proprioception tested in the upper extremities was normal.   Coordination: Rapid alternating movements in the fingers/hands were of normal speed.  The Finger-to-nose maneuver was intact without evidence of ataxia, dysmetria or tremor.   Gait and station: Patient could not rise unassisted from a seated position, he has to brace himself, the right leg is stiffer and weaker, the knee extension is weak, the hip flexion is weak, but he is able to dorsal-flexion of his feet.  He walked with a walker - rollator assistive device.   Toe and heel walk were deferred.  Deep tendon reflexes: in the  lower extremities are absent - attenuated  Babinski response was deferred .       After spending a total time of  45 minutes face to face and additional time for physical and neurologic examination, review of laboratory studies,  personal review of imaging studies, reports and results of other testing and review of referral information / records as far as provided in visit, I have established the following assessments:  Postpolio.   1) there is a progressive muscle fatigue as well as general fatigue manifesting in less exercise tolerance. He  also  is at risk of falling asleep , when not physically active and not stimulated. He uses  a sc[place] [facility]. high risk of falling.  2) he takes frequent , unscheduled naps every day- EDS .highy fatigued.  3) likely some sleep apnea present.    My Plan is to proceed with:  1)  We can either invite to an attended sleep study- he would need assistance with bathroom breaks - or to HST as a screening test.  2) I encourage to return to swim, with a person in attendance.  3) will check for OSA. 4) consider EMG and NCV to help explain the right leg weakness progressing now. POST POLIO?   I would like to thank Crist Infante, [place],   [id number] for allowing me to meet with and to take care of this pleasant patient.   In short, Eddie Sanders is presenting with EDS and high degree of fatigue, a symptom that can be attributed to  I plan to follow up either personally or through our NP within 2-3 month.   CC: I will share my notes with PCP.  Electronically signed by: Larey Seat, MD 07/08/2020 1:13 PM  [facility] and [facility] Board certified by The [employer] of Sleep Medicine and Diplomate of the [employer] of Sleep Medicine. Board certified In Neurology through the [place], Fellow of the [employer] of Neurology. Medical Director of [employer].

## 2020-07-16 ENCOUNTER — Other Ambulatory Visit: Payer: Self-pay

## 2020-07-16 ENCOUNTER — Ambulatory Visit: Payer: Medicare PPO | Attending: Internal Medicine

## 2020-07-16 ENCOUNTER — Other Ambulatory Visit (HOSPITAL_BASED_OUTPATIENT_CLINIC_OR_DEPARTMENT_OTHER): Payer: Self-pay

## 2020-07-16 DIAGNOSIS — Z23 Encounter for immunization: Secondary | ICD-10-CM

## 2020-07-16 MED ORDER — COVID-19 MRNA VACC (MODERNA) 100 MCG/0.5ML IM SUSP
INTRAMUSCULAR | 0 refills | Status: DC
  Filled 2020-07-16: qty 0.25, 1d supply, fill #0

## 2020-07-16 NOTE — Progress Notes (Signed)
   Covid-19 Vaccination Clinic  Name:  MELANIE PELLOT    MRN: [id number] DOB: February 07, 1929  07/16/2020  Mr. Brassell was observed post Covid-19 immunization for 15 minutes without incident. He was provided with Vaccine Information Sheet and instruction to access the V-Safe system.   Mr. Zeitlin was instructed to call 911 with any severe reactions post vaccine: Marland Kitchen Difficulty breathing  . Swelling of face and throat  . A fast heartbeat  . A bad rash all over body  . Dizziness and weakness   Immunizations Administered    Name Date Dose VIS Date Route   Moderna Covid-19 Booster Vaccine 07/16/2020  1:14 PM 0.25 mL 12/06/2019 Intramuscular   Manufacturer: Moderna   Lot: [id number]   [place]: [id number]

## 2020-07-22 ENCOUNTER — Telehealth: Payer: Self-pay

## 2020-07-22 ENCOUNTER — Telehealth: Payer: Self-pay | Admitting: Neurology

## 2020-07-22 NOTE — Telephone Encounter (Signed)
We have attempted to call the patient 2 times to schedule sleep study. Patient has been unavailable at the phone numbers we have on file and has not returned our calls. If patient calls back we will schedule them for their sleep study. ° °

## 2020-07-22 NOTE — Telephone Encounter (Signed)
Pt is asking for a call re: the status of his home sleep study and what to expect, please call.

## 2020-08-05 ENCOUNTER — Ambulatory Visit (INDEPENDENT_AMBULATORY_CARE_PROVIDER_SITE_OTHER): Payer: Medicare PPO | Admitting: Neurology

## 2020-08-05 DIAGNOSIS — G4733 Obstructive sleep apnea (adult) (pediatric): Secondary | ICD-10-CM

## 2020-08-05 DIAGNOSIS — G14 Postpolio syndrome: Secondary | ICD-10-CM

## 2020-08-05 DIAGNOSIS — G4731 Primary central sleep apnea: Secondary | ICD-10-CM

## 2020-08-05 DIAGNOSIS — G4719 Other hypersomnia: Secondary | ICD-10-CM

## 2020-08-05 DIAGNOSIS — R5383 Other fatigue: Secondary | ICD-10-CM

## 2020-08-07 NOTE — Progress Notes (Signed)
   [facility] (Watch PAT) REPORT  STUDY DATE: 08/05/20  DOB: 1928-09-13  MRN: [id number]  ORDERING CLINICIAN: Larey Seat, MD   REFERRING CLINICIANCrist Infante, MD   CLINICAL INFORMATION/HISTORY: 07-08-2020: Eddie Sanders  has a past medical history of: Adenomatous colon polyp, Allergic rhinitis, Osteoarthritis, GERD (gastroesophageal reflux disease), Hyperlipidemia, tonsillectomy and adenoid ectomy in 1939, Poliomyelitis in 1939, Post-polio syndrome with Gait Disorder since 1964,   Prostate cancer ([place]), and Skin cancer (melanoma) ([place]). He reports daytime sleepiness, dozing off when it is not socially acceptable.   Epworth sleepiness score: 15/24.  BMI: 25.6 kg/m  Neck Circumference: 17"  Sleep Summary:   Total Recording Time (hours, min): 5 h 55 min Total Sleep Time (hours, min):  5 h 36 min  Percent REM (%):    15.53 %   Respiratory Indices:   Calculated pAHI (per hour):  58.5/hour        REM pAHI:                             45.5/hour      NREM pAHI:                        53.6/hour Supine AHI:                            56.1/ hour  Oxygen Saturation Statistics:    Oxygen Saturation Mean:                               93%  Minimum oxygen saturation ( Nadir SpO2):   60%  O2 Saturation Range :                             60-100%   O2 Saturation (minutes) <89%:               23.7 min  Pulse Rate Statistics:   Pulse Mean (bpm): 62 bpm   Pulse Range:          47-91 bpm   IMPRESSION: This HST documented severe sleep apnea, but more than half of these apneas were central apneas. The obstructive apneas were linked to a clinically significant degree of REM sleep hypoxemia. There was no positional dependency.   RECOMMENDATION: This is complex and severe sleep apnea and is unlikely to be corrected with CPAP, more likely it will be BiPAP or ASV needed to correct this apnea form. These treatment options should be explored in the sleep  laboratory- I will order a PAP titration, if needed with additional use of oxygen.     INTERPRETING PHYSICIAN:  Larey Seat, MD    [facility] and [facility] Sleep Board certified by The [employer] of Sleep Medicine and Diplomate of the [employer] of Sleep Medicine. Board certified In Neurology through the [place], Fellow of the [employer] of Neurology. Medical Director of [employer].

## 2020-08-13 ENCOUNTER — Telehealth: Payer: Self-pay | Admitting: Neurology

## 2020-08-13 MED ORDER — ALPRAZOLAM 1 MG PO TABS: ORAL_TABLET | ORAL | 0 refills | Status: DC

## 2020-08-13 NOTE — Telephone Encounter (Signed)
[facility] (Watch PAT) REPORT  STUDY DATE: 08/05/20  DOB: 05-21-1928  MRN: [id number]  ORDERING CLINICIAN: Larey Seat, MD   REFERRING CLINICIANCrist Infante, MD   CLINICAL INFORMATION/HISTORY: 07-08-2020: JANE BROUGHTON  has a past medical history of: Adenomatous colon polyp, Allergic rhinitis, Osteoarthritis, GERD (gastroesophageal reflux disease), Hyperlipidemia, tonsillectomy and adenoid ectomy in 1939, Poliomyelitis in 1939, Post-polio syndrome with Gait Disorder since 1964,   Prostate cancer ([place]), and Skin cancer (melanoma) ([place]). He reports daytime sleepiness, dozing off when it is not socially acceptable.   Epworth sleepiness score: 15/24.  BMI: 25.6 kg/m  Neck Circumference: 17"  Sleep Summary:   Total Recording Time (hours, min): 5 h 55 min Total Sleep Time (hours, min):  5 h 36 min  Percent REM (%):    15.53 %   Respiratory Indices:   Calculated pAHI (per hour):  59.3/hour        REM pAHI:                             47.6/hour      NREM pAHI:                        61.5/hour Supine AHI:                            57.3/ hour  Oxygen Saturation Statistics:    Oxygen Saturation Mean:                               93%  Minimum oxygen saturation ( Nadir SpO2):   60%  O2 Saturation Range :                             60-100%   O2 Saturation (minutes) <89%:               23.9 min  Pulse Rate Statistics:   Pulse Mean (bpm):   62,  Pulse Range (47-91 bpm)   IMPRESSION: This HST documented severe sleep apnea, but more than half of these apneas were central apneas. The obstructive apneas were linked to a clinically significant degree of REM sleep hypoxemia. There was no positional dependency.   RECOMMENDATION: This is complex and severe sleep apnea and is unlikely to be corrected with CPAP, more likely it will be BiPAP or ASV needed to correct this apnea form. These treatment options should be explored in the sleep laboratory- I will  order a PAP titration, if needed with additional use of oxygen.     INTERPRETING PHYSICIAN:  Larey Seat, MD    [facility] and [facility] Sleep Board certified by The [employer] of Sleep Medicine and Diplomate of the [employer] of Sleep Medicine. Board certified In Neurology through the [place], Fellow of the [employer] of Neurology. Medical Director of [employer].

## 2020-08-13 NOTE — Progress Notes (Signed)
IMPRESSION: This HST documented severe sleep apnea, but more than half of these apneas were central apneas. The obstructive apneas were linked to a clinically significant degree of REM sleep hypoxemia. There was no positional dependency.   RECOMMENDATION: This is complex and severe sleep apnea and is unlikely to be corrected with CPAP. It will more likely respond to either BiPAP or ASV. These Therapies are often needed to correct this apnea form. These treatment options should be explored in the sleep laboratory- I will order a PAP titration, if needed with additional use of oxygen. Postpolio syndrome can affect the strength of muscles lifting the rib cage and also contribute to this apnea form.     INTERPRETING PHYSICIAN:  Larey Seat, MD

## 2020-08-13 NOTE — Progress Notes (Signed)
I wrote for Xanax just in case the patient can not sleep in the sleep lab, but he will have to return for an in lab study. See below.

## 2020-08-13 NOTE — Addendum Note (Signed)
Addended by: Larey Seat on: 08/13/2020 03:21 PM   Modules accepted: Orders

## 2020-08-13 NOTE — Procedures (Signed)
[facility] (Watch PAT) REPORT  STUDY DATE: 08/05/20  DOB: 09-09-1928  MRN: [id number]  ORDERING CLINICIAN: Larey Seat, MD   REFERRING CLINICIANCrist Infante, MD   CLINICAL INFORMATION/HISTORY: 07-08-2020: Eddie Sanders  has a past medical history of: Adenomatous colon polyp, Allergic rhinitis, Osteoarthritis, GERD (gastroesophageal reflux disease), Hyperlipidemia, tonsillectomy and adenoid ectomy in 1939, Poliomyelitis in 1939, Post-polio syndrome with Gait Disorder since 1964,   Prostate cancer ([place]), and Skin cancer (melanoma) ([name]). He reports daytime sleepiness, dozing off when it is not socially acceptable.   Epworth sleepiness score: 15/24.  BMI: 25.6 kg/m  Neck Circumference: 17"  Sleep Summary:   Total Recording Time (hours, min): 5 h 55 min Total Sleep Time (hours, min):  5 h 36 min  Percent REM (%):    15.53 %   Respiratory Indices:   Calculated pAHI (per hour):  58.5/hour        REM pAHI:                             45.5/hour      NREM pAHI:                        53.6/hour Supine AHI:                            56.1/ hour  Oxygen Saturation Statistics:    Oxygen Saturation Mean:                               93%  Minimum oxygen saturation ( Nadir SpO2):   60%  O2 Saturation Range :                             60-100%   O2 Saturation (minutes) <89%:               23.7 min  Pulse Rate Statistics:   Pulse Mean (bpm): 62 bpm   Pulse Range:          47-91 bpm   IMPRESSION: This HST documented severe sleep apnea, but more than half of these apneas were central apneas. The obstructive apneas were linked to a clinically significant degree of REM sleep hypoxemia. There was no positional dependency.   RECOMMENDATION: This is complex and severe sleep apnea and is unlikely to be corrected with CPAP. It will more likely respond to either BiPAP or ASV. These Therapies are often needed to correct this apnea form. These treatment  options should be explored in the sleep laboratory- I will order a PAP titration, if needed with additional use of oxygen.     INTERPRETING PHYSICIAN:  Larey Seat, MD    [facility] and [facility] Sleep Board certified by The [employer] of Sleep Medicine and Diplomate of the [employer] of Sleep Medicine. Board certified In Neurology through the [place], Fellow of the [employer] of Neurology. Medical Director of [employer].

## 2020-08-14 ENCOUNTER — Telehealth: Payer: Self-pay | Admitting: Neurology

## 2020-08-14 NOTE — Telephone Encounter (Signed)
-----   Message from Larey Seat, MD sent at 08/13/2020  3:20 PM EDT ----- IMPRESSION: This HST documented severe sleep apnea, but more than half of these apneas were central apneas. The obstructive apneas were linked to a clinically significant degree of REM sleep hypoxemia. There was no positional dependency.   RECOMMENDATION: This is complex and severe sleep apnea and is unlikely to be corrected with CPAP. It will more likely respond to either BiPAP or ASV. These Therapies are often needed to correct this apnea form. These treatment options should be explored in the sleep laboratory- I will order a PAP titration, if needed with additional use of oxygen. Postpolio syndrome can affect the strength of muscles lifting the rib cage and also contribute to this apnea form.     INTERPRETING PHYSICIAN:  Larey Seat, MD

## 2020-08-14 NOTE — Telephone Encounter (Signed)
I called pt. I advised pt that Dr. Brett Fairy reviewed their sleep study results and found that pt has severe sleep apnea and recommends that pt be treated with a cpap. Dr. Brett Fairy recommends that pt return for a repeat sleep study in order to properly titrate the cpap and ensure a good mask fit. Pt is agreeable to returning for a titration study. I advised pt that our sleep lab will file with pt's insurance and call pt to schedule the sleep study when we hear back from the pt's insurance regarding coverage of this sleep study. Pt verbalized understanding of results. Pt had no questions at this time but was encouraged to call back if questions arise.

## 2020-09-02 ENCOUNTER — Telehealth: Payer: Self-pay

## 2020-09-02 NOTE — Telephone Encounter (Signed)
I called pt to schedule him for CPAP titration study. No answer unable to lvm. Phone just rings.

## 2020-09-19 DIAGNOSIS — D485 Neoplasm of uncertain behavior of skin: Secondary | ICD-10-CM | POA: Diagnosis not present

## 2020-09-19 DIAGNOSIS — Z85828 Personal history of other malignant neoplasm of skin: Secondary | ICD-10-CM | POA: Diagnosis not present

## 2020-09-19 DIAGNOSIS — L814 Other melanin hyperpigmentation: Secondary | ICD-10-CM | POA: Diagnosis not present

## 2020-09-19 DIAGNOSIS — L821 Other seborrheic keratosis: Secondary | ICD-10-CM | POA: Diagnosis not present

## 2020-09-19 DIAGNOSIS — C44311 Basal cell carcinoma of skin of nose: Secondary | ICD-10-CM | POA: Diagnosis not present

## 2020-09-19 DIAGNOSIS — D1801 Hemangioma of skin and subcutaneous tissue: Secondary | ICD-10-CM | POA: Diagnosis not present

## 2020-09-19 DIAGNOSIS — L570 Actinic keratosis: Secondary | ICD-10-CM | POA: Diagnosis not present

## 2020-09-25 DIAGNOSIS — Z85828 Personal history of other malignant neoplasm of skin: Secondary | ICD-10-CM | POA: Diagnosis not present

## 2020-09-25 DIAGNOSIS — C44311 Basal cell carcinoma of skin of nose: Secondary | ICD-10-CM | POA: Diagnosis not present

## 2020-10-23 ENCOUNTER — Other Ambulatory Visit: Payer: Self-pay

## 2020-10-23 ENCOUNTER — Ambulatory Visit (INDEPENDENT_AMBULATORY_CARE_PROVIDER_SITE_OTHER): Payer: Medicare PPO | Admitting: Neurology

## 2020-10-23 DIAGNOSIS — G4739 Other sleep apnea: Secondary | ICD-10-CM

## 2020-10-23 DIAGNOSIS — R5383 Other fatigue: Secondary | ICD-10-CM

## 2020-10-23 DIAGNOSIS — G4719 Other hypersomnia: Secondary | ICD-10-CM

## 2020-10-23 DIAGNOSIS — G14 Postpolio syndrome: Secondary | ICD-10-CM

## 2020-10-23 DIAGNOSIS — G4733 Obstructive sleep apnea (adult) (pediatric): Secondary | ICD-10-CM | POA: Diagnosis not present

## 2020-10-23 DIAGNOSIS — G4731 Primary central sleep apnea: Secondary | ICD-10-CM

## 2020-10-28 DIAGNOSIS — G4739 Other sleep apnea: Secondary | ICD-10-CM | POA: Insufficient documentation

## 2020-10-28 DIAGNOSIS — G4731 Primary central sleep apnea: Secondary | ICD-10-CM | POA: Insufficient documentation

## 2020-10-28 NOTE — Addendum Note (Signed)
Addended by: Larey Seat on: 10/28/2020 03:17 PM   Modules accepted: Orders

## 2020-10-28 NOTE — Procedures (Signed)
PATIENT'S NAME:  Eddie Sanders, Eddie Sanders DOB:      1928-08-31      MR#:    [id number]     DATE OF RECORDING: 10/23/2020 REFERRING M.D.:  Crist Infante MD Study Performed: Titration to positive airway pressure  HISTORY:  Mateja Der Uriostegui's recent HST performed on 08/05/2020 revealed: Severe sleep apnea, but more than half of these apneas were central apneas. The obstructive apneas were linked to a clinically significant degree of REM sleep hypoxemia. There was no positional dependency. has a past medical history of: Adenomatous colon polyp, Allergic rhinitis, Osteoarthritis, GERD (gastroesophageal reflux disease), Hyperlipidemia, tonsillectomy and adenoidectomy in 1939, Poliomyelitis in 1939 leading to Post-polio syndrome with Gait Disorder since 1964,  POST POLIO,   Prostate cancer ([place]), and Skin cancer (melanoma) ([place]). He reports daytime sleepiness, dozing off when it is not socially acceptable.   The patient endorsed the Epworth Sleepiness Scale at 15 points.   The patient's weight 183 pounds with a height of 71 (inches), resulting in a BMI of 25.6 kg/m2. The patient's neck circumference measured 17 inches.  CURRENT MEDICATIONS: Calcium+D, Vitamin D, CoQ-10, Cyanocobalamin, Centrum Silver, Zocor Xanax as sleep aid for the night.     PROCEDURE:  This is a multichannel digital polysomnogram utilizing the SomnoStar 11.2 system.  Electrodes and sensors were applied and monitored per AASM Specifications.   EEG, EOG, Chin and Limb EMG, were sampled at 200 Hz.  ECG, Snore and Nasal Pressure, Thermal Airflow, Respiratory Effort, CPAP Flow and Pressure, Oximetry was sampled at 50 Hz. Digital video and audio were recorded.       The patient was fitted with a S/M [place] Full Face with nasal cushion. CPAP was initiated at 5 cmH20 with heated humidity per AASM standards and pressure was advanced to a final 17/cmH20 because of hypopneas, apneas and desaturations.  At a PAP pressure of 17 cmH20, there was a  reduction of the AHI to 4.8/h with some improvement of sleep apnea.  Lights Out was at 22:01 and Lights On at 04:46. Total recording time (TRT) was 405.5 minutes, with a total sleep time (TST) of 392.5 minutes. The patient's sleep latency was 5 minutes. REM latency was 108.5 minutes.  The sleep efficiency was 96.8 %.    SLEEP ARCHITECTURE: WASO (Wake after sleep onset) was 3.5 minutes.  There were 2.5 minutes in Stage N1, 360.5 minutes Stage N2, 0 minutes Stage N3 and 29.5 minutes in Stage REM.  The percentage of Stage N1 was .6%, Stage N2 was 91.8%, Stage N3 was 0% and Stage R (REM sleep) was 7.5%. The sleep architecture was notable for decreased REM sleep.  RESPIRATORY ANALYSIS:  There was a total of 146 respiratory events: 96 obstructive apneas, 13 central apneas and 22 mixed apneas with a total of 131 apneas and an apnea index (AHI) of 20. /hour. There were 15 hypopneas with a hypopnea index of 2.3/hour.  The patient also had 1 respiratory event related arousal (RERAs).      The total APNEA/HYPOPNEA INDEX (AHI) was 22.3 /hour and the total RESPIRATORY DISTURBANCE INDEX was 22.5 /hour.   12 events occurred in REM sleep and 134 events in NREM. The REM AHI was 24.4 /hour versus a non-REM AHI of 22.1 /hour.  The patient spent 392.5 minutes of total sleep time in the supine position and 0 minutes in non-supine. The supine AHI was 22.3.  OXYGEN SATURATION & C02:  The baseline 02 saturation was 95%, with the lowest being 79%. Time  spent below 89% saturation equaled 22 minutes.  The arousals were noted as: 15 were spontaneous, 0 were associated with PLMs, 67 were associated with respiratory events. The patient had a total of 0 Periodic Limb Movements.  Audio and video analysis did not show any abnormal or unusual movements, behaviors, phonations or vocalizations.  Snoring was noted. EKG was irregular and bradycardic.  DIAGNOSIS Complex and mostly Obstructive Sleep Apnea, severe degree (AHI baseline  here at 66/h)   Some intermittent Sleep Related Hypoxemia. Non-specific abnormal EKG. The patient woke with the lower part of his mask in his mouth, due to chin drop.    PLANS/RECOMMENDATIONS: Benancio Deeds FFM with nasal cushion medium size, and chin strap.  CPAP at none of these pressures eliminated sleep apnea completely, and central apneas were evident under therapy - this patient may tolerate BiPAP much better than high pressure CPAP. 17 cm water CPAP was tolerated - but AHI remained 4.8/h.  Auto BiPAP 16/ 12 through 21/ 17 cm water is may recommendation.     Follow-up with Sleep clinic NP or MD.  CPAP therapy compliance is defined as 4 hours or more with nightly use.  Any apnea patient should avoid sedatives, hypnotics, and alcohol consumption at bedtime.   DISCUSSION: A follow up appointment will be scheduled in the Sleep Clinic at [facility].   Please call [phone] with any questions.      I certify that I have reviewed the entire raw data recording prior to the issuance of this report in accordance with the Standards of Accreditation of the American Academy of Sleep Medicine (AASM)   Larey Seat, M.D. Medical Director, [facility] at [employer], [employer] [facility]  Neurology and Sleep Medicine ( Neurology and Sleep Medicine) [place], [place] of Sleep Medicine

## 2020-10-28 NOTE — Progress Notes (Signed)
DIAGNOSIS 1. Complex and mostly Obstructive Sleep Apnea, severe degree (AHI baseline here at 66/h)   2. Some intermittent Sleep Related Hypoxemia. 3. Non-specific abnormal EKG. 4. The patient woke with the lower part of his mask in his mouth, due to chin drop.    PLANS/RECOMMENDATIONS: Eddie Sanders FFM with nasal cushion medium size, and chin strap.  CPAP at none of these pressures eliminated sleep apnea completely, and central apneas were evident under therapy - this patient may tolerate BiPAP much better than high pressure CPAP. 17 cm water CPAP was tolerated - but AHI remained 4.8/h.  Auto BiPAP 16/ 12 through 21/ 17 cm water is my recommendation- if we can get this order through, fine- if not invite for BiPAP titration.   1. Follow-up with Sleep clinic NP or MD.  2. CPAP therapy compliance is defined as 4 hours or more with nightly use.  3. Any apnea patient should avoid sedatives, hypnotics, and alcohol consumption at bedtime.   DISCUSSION: A follow up appointment will be scheduled in the Sleep Clinic at [facility].   Please call [phone] with any questions.

## 2020-10-30 ENCOUNTER — Other Ambulatory Visit: Payer: Self-pay | Admitting: Neurology

## 2020-10-30 ENCOUNTER — Telehealth: Payer: Self-pay | Admitting: Neurology

## 2020-10-30 DIAGNOSIS — G4761 Periodic limb movement disorder: Secondary | ICD-10-CM

## 2020-10-30 DIAGNOSIS — G4733 Obstructive sleep apnea (adult) (pediatric): Secondary | ICD-10-CM

## 2020-10-30 NOTE — Telephone Encounter (Signed)
-----   Message from Larey Seat, MD sent at 10/28/2020  3:16 PM EDT ----- DIAGNOSIS 1. Complex and mostly Obstructive Sleep Apnea, severe degree (AHI baseline here at 66/h)   2. Some intermittent Sleep Related Hypoxemia. 3. Non-specific abnormal EKG. 4. The patient woke with the lower part of his mask in his mouth, due to chin drop.    PLANS/RECOMMENDATIONS: Eddie Sanders FFM with nasal cushion medium size, and chin strap.  CPAP at none of these pressures eliminated sleep apnea completely, and central apneas were evident under therapy - this patient may tolerate BiPAP much better than high pressure CPAP. 17 cm water CPAP was tolerated - but AHI remained 4.8/h.  Auto BiPAP 16/ 12 through 21/ 17 cm water is my recommendation- if we can get this order through, fine- if not invite for BiPAP titration.     1. Follow-up with Sleep clinic NP or MD.  2. CPAP therapy compliance is defined as 4 hours or more with nightly use.  3. Any apnea patient should avoid sedatives, hypnotics, and alcohol consumption at bedtime.   DISCUSSION: A follow up appointment will be scheduled in the Sleep Clinic at [facility].   Please call [phone] with any questions.

## 2020-10-30 NOTE — Telephone Encounter (Signed)
I called pt. I advised pt that Dr. Brett Fairy reviewed their sleep study results and found that pt was best treated with CPAP at a pressure of 17 cm water pressure. Dr. Brett Fairy recommends that pt starts CPAP. I reviewed PAP compliance expectations with the pt. Pt is agreeable to starting a CPAP. I advised pt that an order will be sent to a DME, [facility], and [facility] will call the pt within about one week after they file with the pt's insurance. [facility] will show the pt how to use the machine, fit for masks, and troubleshoot the CPAP if needed. A follow up appt was made for insurance purposes with Dr. Brett Fairy on Jan 9,2023 at 3:30 pm. Pt verbalized understanding to arrive 15 minutes early and bring their CPAP. A letter with all of this information in it will be mailed to the pt as a reminder. I verified with the pt that the address we have on file is correct. Pt verbalized understanding of results. Pt had no questions at this time but was encouraged to call back if questions arise. I have sent the order to [facility] and have received confirmation that they have received the order.

## 2020-11-07 DIAGNOSIS — F439 Reaction to severe stress, unspecified: Secondary | ICD-10-CM | POA: Diagnosis not present

## 2020-11-07 DIAGNOSIS — G629 Polyneuropathy, unspecified: Secondary | ICD-10-CM | POA: Diagnosis not present

## 2020-11-07 DIAGNOSIS — G4733 Obstructive sleep apnea (adult) (pediatric): Secondary | ICD-10-CM | POA: Diagnosis not present

## 2020-11-07 DIAGNOSIS — I739 Peripheral vascular disease, unspecified: Secondary | ICD-10-CM | POA: Diagnosis not present

## 2020-11-07 DIAGNOSIS — G8929 Other chronic pain: Secondary | ICD-10-CM | POA: Diagnosis not present

## 2020-11-07 DIAGNOSIS — M8960 Osteopathy after poliomyelitis, unspecified site: Secondary | ICD-10-CM | POA: Diagnosis not present

## 2020-11-07 DIAGNOSIS — B91 Sequelae of poliomyelitis: Secondary | ICD-10-CM | POA: Diagnosis not present

## 2020-11-07 DIAGNOSIS — E785 Hyperlipidemia, unspecified: Secondary | ICD-10-CM | POA: Diagnosis not present

## 2020-11-07 DIAGNOSIS — M1990 Unspecified osteoarthritis, unspecified site: Secondary | ICD-10-CM | POA: Diagnosis not present

## 2020-11-13 DIAGNOSIS — G4733 Obstructive sleep apnea (adult) (pediatric): Secondary | ICD-10-CM | POA: Diagnosis not present

## 2020-11-14 ENCOUNTER — Other Ambulatory Visit (HOSPITAL_BASED_OUTPATIENT_CLINIC_OR_DEPARTMENT_OTHER): Payer: Self-pay

## 2020-11-14 ENCOUNTER — Ambulatory Visit: Payer: Medicare PPO | Attending: Internal Medicine

## 2020-11-14 DIAGNOSIS — Z23 Encounter for immunization: Secondary | ICD-10-CM

## 2020-11-14 MED ORDER — COVID-19 MRNA VACC (MODERNA) 100 MCG/0.5ML IM SUSP
INTRAMUSCULAR | 0 refills | Status: DC
  Filled 2020-11-14: qty 0.5, 1d supply, fill #0

## 2020-11-14 NOTE — Progress Notes (Signed)
   Covid-19 Vaccination Clinic  Name:  LESLY JOSLYN    MRN: [id number] DOB: 07-29-1928  11/14/2020  Mr. Knotts was observed post Covid-19 immunization for 15 minutes without incident. He was provided with Vaccine Information Sheet and instruction to access the V-Safe system.   Mr. Uselman was instructed to call 911 with any severe reactions post vaccine: Difficulty breathing  Swelling of face and throat  A fast heartbeat  A bad rash all over body  Dizziness and weakness

## 2020-12-07 DIAGNOSIS — Z23 Encounter for immunization: Secondary | ICD-10-CM | POA: Diagnosis not present

## 2020-12-09 ENCOUNTER — Telehealth: Payer: Self-pay | Admitting: Neurology

## 2020-12-09 NOTE — Telephone Encounter (Signed)
Pt showing on Icode connect but no data showing. I reached out to [place]. He will f/u with pt to coordinate him bringing in machine to get download. Jeneen Rinks will then forward report to our office for Dr. Brett Fairy to review. Pt also aware Jeneen Rinks will explain how to upload data via app/work with family member if needed to complete this. Pt will call back if he does not hear from [place].

## 2020-12-09 NOTE — Telephone Encounter (Signed)
Pt called, spoke with [facility] and have had 4 visits at [facility]. Was fitted with mask and still having trouble with mask leaking. Would like a call from the nurse.

## 2020-12-09 NOTE — Telephone Encounter (Signed)
Unable to pull data from icode connect. Sent message to Darnelle Bos (Naval architect) to see about getting him tagged. Waiting on response.

## 2020-12-11 DIAGNOSIS — E785 Hyperlipidemia, unspecified: Secondary | ICD-10-CM | POA: Diagnosis not present

## 2020-12-11 DIAGNOSIS — E1122 Type 2 diabetes mellitus with diabetic chronic kidney disease: Secondary | ICD-10-CM | POA: Diagnosis not present

## 2020-12-11 DIAGNOSIS — Z125 Encounter for screening for malignant neoplasm of prostate: Secondary | ICD-10-CM | POA: Diagnosis not present

## 2020-12-11 DIAGNOSIS — M810 Age-related osteoporosis without current pathological fracture: Secondary | ICD-10-CM | POA: Diagnosis not present

## 2020-12-13 DIAGNOSIS — G4733 Obstructive sleep apnea (adult) (pediatric): Secondary | ICD-10-CM | POA: Diagnosis not present

## 2020-12-17 ENCOUNTER — Telehealth: Payer: Self-pay | Admitting: Neurology

## 2020-12-17 NOTE — Telephone Encounter (Signed)
Jeneen Rinks checking w/ pt location manager to see if they have had appt w/ family member and pt to do education. If not, I have asked that they coordinate this. Waiting on response.

## 2020-12-17 NOTE — Telephone Encounter (Signed)
Called the patient back and was able to discuss his concerns with him. Overall the patient has the same concerns in regards to mask. He has been in to try 4 different mask. The most recent mask works well but he had to tighten too much on his nose and when he woke up it had caused irritation to skin and was tender.  I advised that in reviewing what little data I have there has only been an av of 58 min used and so we would need to see if he could tolerate using the machine for longer period of time. Per Dr Dohmeier's notes she had suggested that the pt may not be able to tolerate the pressures being elevated at cpap 17 cm water pressure. Pt does admit the pressure feels like a lot and he is concerned about whether the humidification is correct.  Advised that we could recommend having him come in for a titration where we try and titrate him to bipap to see if that felt better and if he was able to tolerate that better. Or alternatively he can reach out to to company and have them do some more education with the machine itself, making sure the mask if correct and everything is working properly. Pt verbalized understanding and would like to try that first. He states that he will contact the company and schedule another apt. Encouraged the patient to also have a family member go with him just to be another set of eyes and ears and making sure understanding the machine and how to work it. Pt verbalized understanding. He will update [place] if he wants to pursue titration study

## 2020-12-17 NOTE — Telephone Encounter (Signed)
Received message back from Jeneen Rinks that he will follow up on this.

## 2020-12-17 NOTE — Telephone Encounter (Signed)
Pt is asking for a call to discuss setting up a time for instructions on how to properly apply and use his CPAP.  Pt states he has reached out to DME but, for this he would like a call from RN re: hopefully being able to get assistance on this request from here.

## 2020-12-17 NOTE — Telephone Encounter (Signed)
Sent message to Jeneen Rinks Corporate treasurer). He was supposed to have reached out to pt 12/09/20 to coordinate training w/ machine/fitting. Waiting on response.

## 2020-12-18 DIAGNOSIS — N182 Chronic kidney disease, stage 2 (mild): Secondary | ICD-10-CM | POA: Diagnosis not present

## 2020-12-18 DIAGNOSIS — R2689 Other abnormalities of gait and mobility: Secondary | ICD-10-CM | POA: Diagnosis not present

## 2020-12-18 DIAGNOSIS — G4733 Obstructive sleep apnea (adult) (pediatric): Secondary | ICD-10-CM | POA: Diagnosis not present

## 2020-12-18 DIAGNOSIS — E1122 Type 2 diabetes mellitus with diabetic chronic kidney disease: Secondary | ICD-10-CM | POA: Diagnosis not present

## 2020-12-18 DIAGNOSIS — M810 Age-related osteoporosis without current pathological fracture: Secondary | ICD-10-CM | POA: Diagnosis not present

## 2020-12-18 DIAGNOSIS — L84 Corns and callosities: Secondary | ICD-10-CM | POA: Diagnosis not present

## 2020-12-18 DIAGNOSIS — E785 Hyperlipidemia, unspecified: Secondary | ICD-10-CM | POA: Diagnosis not present

## 2020-12-18 DIAGNOSIS — R82998 Other abnormal findings in urine: Secondary | ICD-10-CM | POA: Diagnosis not present

## 2020-12-18 DIAGNOSIS — Z0000 Encounter for general adult medical examination without abnormal findings: Secondary | ICD-10-CM | POA: Diagnosis not present

## 2020-12-18 DIAGNOSIS — E1159 Type 2 diabetes mellitus with other circulatory complications: Secondary | ICD-10-CM | POA: Diagnosis not present

## 2020-12-31 ENCOUNTER — Telehealth: Payer: Self-pay | Admitting: Neurology

## 2020-12-31 NOTE — Telephone Encounter (Signed)
Pt is asking for a call to discuss an alternative machine to his CPAP.

## 2020-12-31 NOTE — Telephone Encounter (Signed)
Called pt back. He is still having trouble tolerating cpap. Mask still coming off/air leakage. Cannot put on any tighter w/o feeling like he is going to cause damage. Agreeable to come in for f/u to discuss other options (such as bipap/titration study). Scheduled appt for 01/06/21 at 1:30 p w/ Dr. Brett Fairy. He will bring current machine to appt.

## 2021-01-03 ENCOUNTER — Encounter: Payer: Self-pay | Admitting: Podiatry

## 2021-01-03 ENCOUNTER — Ambulatory Visit: Payer: Medicare PPO | Admitting: Podiatry

## 2021-01-03 ENCOUNTER — Other Ambulatory Visit: Payer: Self-pay

## 2021-01-03 DIAGNOSIS — M21372 Foot drop, left foot: Secondary | ICD-10-CM | POA: Diagnosis not present

## 2021-01-03 DIAGNOSIS — G14 Postpolio syndrome: Secondary | ICD-10-CM

## 2021-01-03 DIAGNOSIS — M21371 Foot drop, right foot: Secondary | ICD-10-CM

## 2021-01-06 ENCOUNTER — Other Ambulatory Visit (HOSPITAL_COMMUNITY): Payer: Self-pay | Admitting: *Deleted

## 2021-01-06 ENCOUNTER — Ambulatory Visit: Payer: Medicare PPO | Admitting: Neurology

## 2021-01-06 ENCOUNTER — Other Ambulatory Visit: Payer: Self-pay

## 2021-01-06 ENCOUNTER — Encounter: Payer: Self-pay | Admitting: Neurology

## 2021-01-06 VITALS — BP 162/89 | HR 82 | Ht 71.0 in | Wt 188.0 lb

## 2021-01-06 DIAGNOSIS — G4739 Other sleep apnea: Secondary | ICD-10-CM

## 2021-01-06 DIAGNOSIS — G14 Postpolio syndrome: Secondary | ICD-10-CM | POA: Diagnosis not present

## 2021-01-06 DIAGNOSIS — G4731 Primary central sleep apnea: Secondary | ICD-10-CM

## 2021-01-06 DIAGNOSIS — J3089 Other allergic rhinitis: Secondary | ICD-10-CM | POA: Diagnosis not present

## 2021-01-06 NOTE — Patient Instructions (Signed)
CPAP and BIPAP Information CPAP and BIPAP are methods that use air pressure to keep your airways open and to help you breathe well. CPAP and BIPAP use different amounts of pressure. Your health care provider will tell you whether CPAP or BIPAP would be more helpful for you. CPAP stands for "continuous positive airway pressure." With CPAP, the amount of pressure stays the same while you breathe in (inhale) and out (exhale). BIPAP stands for "bi-level positive airway pressure." With BIPAP, the amount of pressure will be higher when you inhale and lower when you exhale. This allows you to take larger breaths. CPAP or BIPAP may be used in the hospital, or your health care provider may want you to use it at home. You may need to have a sleep study before your health care provider can order a machine for you to use at home. What are the advantages? CPAP or BIPAP can be helpful if you have: Sleep apnea. Chronic obstructive pulmonary disease (COPD). Heart failure. Medical conditions that cause muscle weakness, including muscular dystrophy or amyotrophic lateral sclerosis (ALS). Other problems that cause breathing to be shallow, weak, abnormal, or difficult. CPAP and BIPAP are most commonly used for obstructive sleep apnea (OSA) to keep the airways from collapsing when the muscles relax during sleep. What are the risks? Generally, this is a safe treatment. However, problems may occur, including: Irritated skin or skin sores if the mask does not fit properly. Dry or stuffy nose or nosebleeds. Dry mouth. Feeling gassy or bloated. Sinus or lung infection if the equipment is not cleaned properly. When should CPAP or BIPAP be used? In most cases, the mask only needs to be worn during sleep. Generally, the mask needs to be worn throughout the night and during any daytime naps. People with certain medical conditions may also need to wear the mask at other times, such as when they are awake. Follow instructions  from your health care provider about when to use the machine. What happens during CPAP or BIPAP? Both CPAP and BIPAP are provided by a small machine with a flexible plastic tube that attaches to a plastic mask that you wear. Air is blown through the mask into your nose or mouth. The amount of pressure that is used to blow the air can be adjusted on the machine. Your health care provider will set the pressure setting and help you find the best mask for you. Tips for using the mask Because the mask needs to be snug, some people feel trapped or closed-in (claustrophobic) when first using the mask. If you feel this way, you may need to get used to the mask. One way to do this is to hold the mask loosely over your nose or mouth and then gradually apply the mask more snugly. You can also gradually increase the amount of time that you use the mask. Masks are available in various types and sizes. If your mask does not fit well, talk with your health care provider about getting a different one. Some common types of masks include: Full face masks, which fit over the mouth and nose. Nasal masks, which fit over the nose. Nasal pillow or prong masks, which fit into the nostrils. If you are using a mask that fits over your nose and you tend to breathe through your mouth, a chin strap may be applied to help keep your mouth closed. Use a skin barrier to protect your skin as told by your health care provider. Some CPAP and  BIPAP machines have alarms that may sound if the mask comes off or develops a leak. If you have trouble with the mask, it is very important that you talk with your health care provider about finding a way to make the mask easier to tolerate. Do not stop using the mask. There could be a negative impact on your health if you stop using the mask. Tips for using the machine Place your CPAP or BIPAP machine on a secure table or stand near an electrical outlet. Know where the on/off switch is on the  machine. Follow instructions from your health care provider about how to set the pressure on your machine and when you should use it. Do not eat or drink while the CPAP or BIPAP machine is on. Food or fluids could get pushed into your lungs by the pressure of the CPAP or BIPAP. For home use, CPAP and BIPAP machines can be rented or purchased through home health care companies. Many different brands of machines are available. Renting a machine before purchasing may help you find out which particular machine works well for you. Your health insurance company may also decide which machine you may get. Keep the CPAP or BIPAP machine and attachments clean. Ask your health care provider for specific instructions. Check the humidifier if you have a dry stuffy nose or nosebleeds. Make sure it is working correctly. Follow these instructions at home: Take over-the-counter and prescription medicines only as told by your health care provider. Ask if you can take sinus medicine if your sinuses are blocked. Do not use any products that contain nicotine or tobacco. These products include cigarettes, chewing tobacco, and vaping devices, such as e-cigarettes. If you need help quitting, ask your health care provider. Keep all follow-up visits. This is important. Contact a health care provider if: You have redness or pressure sores on your head, face, mouth, or nose from the mask or head gear. You have trouble using the CPAP or BIPAP machine. You cannot tolerate wearing the CPAP or BIPAP mask. Someone tells you that you snore even when wearing your CPAP or BIPAP. Get help right away if: You have trouble breathing. You feel confused. Summary CPAP and BIPAP are methods that use air pressure to keep your airways open and to help you breathe well. If you have trouble with the mask, it is very important that you talk with your health care provider about finding a way to make the mask easier to tolerate. Do not stop using  the mask. There could be a negative impact to your health if you stop using the mask. Follow instructions from your health care provider about when to use the machine. This information is not intended to replace advice given to you by your health care provider. Make sure you discuss any questions you have with your health care provider. Document Revised: 09/11/2020 Document Reviewed: 01/12/2020 Elsevier Patient Education  2022 [place]. Fatigue If you have fatigue, you feel tired all the time and have a lack of energy or a lack of motivation. Fatigue may make it difficult to start or complete tasks because of exhaustion. In general, occasional or mild fatigue is often a normal response to activity or life. However, long-lasting (chronic) or extreme fatigue may be a symptom of a medical condition. Follow these instructions at home: General instructions Watch your fatigue for any changes. Go to bed and get up at the same time every day. Avoid fatigue by pacing yourself during the day  and getting enough sleep at night. Maintain a healthy weight. Medicines Take over-the-counter and prescription medicines only as told by your health care provider. Take a multivitamin, if told by your health care provider.  Do not use herbal or dietary supplements unless they are approved by your health care provider. Activity  Exercise regularly, as told by your health care provider. Use or practice techniques to help you relax, such as yoga, tai chi, meditation, or massage therapy. Eating and drinking  Avoid heavy meals in the evening. Eat a well-balanced diet, which includes lean proteins, whole grains, plenty of fruits and vegetables, and low-fat dairy products. Avoid consuming too much caffeine. Avoid the use of alcohol. Drink enough fluid to keep your urine pale yellow. Lifestyle Change situations that cause you stress. Try to keep your work and personal schedule in balance. Do not use any products  that contain nicotine or tobacco, such as cigarettes and e-cigarettes. If you need help quitting, ask your health care provider. Do not use drugs. Contact a health care provider if: Your fatigue does not get better. You have a fever. You suddenly lose or gain weight. You have headaches. You have trouble falling asleep or sleeping through the night. You feel angry, guilty, anxious, or sad. You are unable to have a bowel movement (constipation). Your skin is dry. You have swelling in your legs or another part of your body. Get help right away if: You feel confused. Your vision is blurry. You feel faint or you pass out. You have a severe headache. You have severe pain in your abdomen, your back, or the area between your waist and hips (pelvis). You have chest pain, shortness of breath, or an irregular or fast heartbeat. You are unable to urinate, or you urinate less than normal. You have abnormal bleeding, such as bleeding from the rectum, vagina, nose, lungs, or nipples. You vomit blood. You have thoughts about hurting yourself or others. If you ever feel like you may hurt yourself or others, or have thoughts about taking your own life, get help right away. You can go to your nearest emergency department or call: Your local emergency services (911 in the [place] A suicide crisis helpline, such as the [place] at 628-553-3222 or 988 in the [place]. This is open 24 hours a day. Summary If you have fatigue, you feel tired all the time and have a lack of energy or a lack of motivation. Fatigue may make it difficult to start or complete tasks because of exhaustion. Long-lasting (chronic) or extreme fatigue may be a symptom of a medical condition. Exercise regularly, as told by your health care provider. Change situations that cause you stress. Try to keep your work and personal schedule in balance. This information is not intended to replace advice given to you by  your health care provider. Make sure you discuss any questions you have with your health care provider. Document Revised: 08/28/2020 Document Reviewed: 12/14/2019 Elsevier Patient Education  2022 [employer].

## 2021-01-06 NOTE — Progress Notes (Addendum)
SLEEP MEDICINE CLINIC    Provider:  Larey Seat, MD  Primary Care Physician:  Crist Infante, MD [place] [address]     Referring Provider: Crist Infante, [address] [place],  [place] [address]          Chief Complaint according to patient   Patient presents with:     New Patient (Initial Visit)      RN :States that he will doze off at a concert or could be talking to friends and nod off.  He feels he avg 6 hrs of sleep. Sometimes can take a while before he falls asleep, and sleep is fragmented because of arthritis pain in right shoulder and right hip and leg.      HISTORY OF PRESENT ILLNESS: 01-06-2021 Eddie Sanders is a 87 - year- old Caucasian male  and retired Optometrist professor,  and was seen upon referral by Dr Joylene Draft in a RV on 01/06/2021. He is a post polio patient as well,  he underwent a HST sleep study with [facility] but he has trouble to use CPAP now.  The patient does not wear dentures, but he notices that the fit of the mask and the air leakage has been difficult to tolerate and if he makes his mask too tight he has discomfort as well.  He underwent first a home sleep test on [id number] which revealed severe apnea but it was more than half of the apneas appear to be central.   He was then invited for an attended Pap titration to see if he can use a CPAP machine : CPAP was initiated at 5 cmH2O and was advanced to 17 cmH2O.  There was a reduction of his AHI to 4.8 with some improvement of sleep apnea.  However he used a small to midsize a Voora fullface mask with nasal cushions while he was in the sleep lab and he stated that this was not the mask he was later provided. He last used a Vitera in large, another FFM. Another Resmed model the F 20 in large was given-his problem is his mouth drops open and lower rim of the mask will be in his mouth.   CC: 01-06-2021; pt alone, here because he has been unable to tolerate using the cpap.  He is unable to tolerate the pressure on the machine and blows his mask off despite making changes. He has tried 3 diff full face masks,  but no chin strap (?).   In light of Professor Eddie Sanders's baseline study which was a home sleep test and his AHI of 58.5 with so many central apneas being mentioned that I do wonder if we should switch him to a BiPAP study.  I also would love to get him to use a chinstrap this would allow him to use nasal mask nasal pillow anything he does not need to cover his fullface.  I looked today at his fatigue severity score and he said I can swim two quarters of a mile and 25 minutes without stopping or I am tired but not fatigued.  I can walk a quarter at of a mile in less than 27 minutes but I am fatigued.  His Epworth sleepiness score remains higher at 14 and his fatigue severity is elevated which we have partially attributed to his postpolio condition.  At this time I think we will serve him better using a BiPAP and also a chinstrap.  Compliance data the  patient has used over the last 30 days with the machine for about 1415 days.  Average pressure was 16 cmH2O average leak 27.7 L/min, average AHI was 14.2/h of which central apneas are 9.4/h.  So clearly this patient needs BiPAP.     For a sleep consultation. He reports an increasing degree of daytime sleepiness.  Chief concern according to patient : Presents today for concerns of falling asleep when he shouldn't be. States that he will doze off at a concert or could be talking to friends and nod off. His legs give way, right more than left.    Eddie Sanders  has a past medical history of Adenomatous colon polyp, Allergic rhinitis, Osteoarthritis, GERD (gastroesophageal reflux disease), Hyperlipidemia, tonsillectomy and adenoid ectomy in 1939, Poliomyelitis in 1939, Post-polio syndrome, Gait Disorder since 1964,   Prostate cancer ([place]), and Skin cancer (melanoma) ([facility]).    Sleep relevant medical history:   Tonsillectomy, post polio .    Family medical /sleep history: no other family member on CPAP with OSA, insomnia, sleep walkers.    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  and lives in a [place] .  Family status is married , wife is one year older, with one daughter 91 16) and  adult  76 and 3 year old grandchildren, several great-grandchildren. .  The patient currently smokes cigars ( smoked one box a year- that's 85 a year) and none since 2016. Tobacco use- remote.  ETOH use - 3 ounces wine rarely ,one scotch a day at 4-5 Pm, 1.5 ounces. Caffeine intake in form of Coffee( 2 cups of black coffee in AM ) .Regular exercise in form of swimming, [employer],  Now at [employer].  Hobbies : Reading and Movies.  Sleep habits are as follows: The patient's dinner time is between 5-7 PM. The patient goes to bed at 11 PM and continues to sleep for 5-6.5 hours, wakes for some bathroom breaks.   The preferred sleep position is supine on a knee pillow and footrest pillow, with the support of 3 pillows.  Dreams are reportedly rare now.  6-7  AM is the usual rise time.  The patient wakes up spontaneously.  He  reports not feeling refreshed or restored in AM, with symptoms such as dry mouth and residual fatigue.  Naps are taken frequently, unscheduled , and lasting from 5-30 minutes and are more refreshing than nocturnal sleep.    Review of Systems: Out of a complete 14 system review, the patient complains of only the following symptoms, and all other reviewed systems are negative.:  Fatigue, sleepiness , snoring, fragmented sleep, hypersomnia.    How likely are you to doze in the following situations: 0 = not likely, 1 = slight chance, 2 = moderate chance, 3 = high chance   Sitting and Reading? Watching Television? Sitting inactive in a public place (theater or meeting)? As a passenger in a car for an hour without a  break? Lying down in the afternoon when circumstances permit? Sitting and talking to someone? Sitting quietly after lunch without alcohol? In a car, while stopped for a few minutes in traffic?   Total = 14/ 24 points - not influenced by CPAP   FSS endorsed at 41/ 63 points.   Social History   Socioeconomic History   Marital status: Married    Spouse name: Not on file   Number of children: 1   Years  of education: Not on file   Highest education level: Not on file  Occupational History   Occupation: retired  Tobacco Use   Smoking status: Some Days    Types: Cigars   Smokeless tobacco: Never   Tobacco comments:    has occasional cigar ;stopped cigarettes in 1965  Substance and Sexual Activity   Alcohol use: Yes    Alcohol/week: 0.0 standard drinks    Comment: 2 oz scotch, 4 oz wine   Drug use: No   Sexual activity: Not on file  Other Topics Concern   Not on file  Social History Narrative   Not on file   Social Determinants of Health   Financial Resource Strain: Not on file  Food Insecurity: Not on file  Transportation Needs: Not on file  Physical Activity: Not on file  Stress: Not on file  Social Connections: Not on file    Family History  Problem Relation Age of Onset   Pancreatic cancer Father    Cancer Father    Heart failure Mother    Heart disease Mother    Colon cancer Neg Hx     Past Medical History:  Diagnosis Date   Adenomatous colon polyp    Allergic rhinitis    Arthritis    GERD (gastroesophageal reflux disease)    Hyperlipidemia    Polio 1939   Post-polio syndrome    Prostate cancer ([facility])    Skin cancer (melanoma) ([facility])    Right eye    Past Surgical History:  Procedure Laterality Date   cataract surgery     bilateral   LUMBAR EPIDURAL INJECTION     LUMBAR LAMINECTOMY/DECOMPRESSION MICRODISCECTOMY Right 07/19/2013   Procedure: LUMBAR LAMINECTOMY/DECOMPRESSION MICRODISCECTOMY 2 LEVELS     lumbar  three/four,  four/five;  Surgeon: Eustace Moore, MD;  Location: [facility];  Service: Neurosurgery;  Laterality: Right;   PROSTATECTOMY     sigmoid colectomy     TONSILLECTOMY       Current Outpatient Medications on File Prior to Visit  Medication Sig Dispense Refill   Calcium Carb-Cholecalciferol (CALCIUM 600 + D PO) Take 1 tablet by mouth daily.     cholecalciferol (VITAMIN D) 1000 UNITS tablet Take 1,000 Units by mouth daily.     Coenzyme Q10 200 MG capsule Take 200 mg by mouth daily.     cyanocobalamin 500 MCG tablet Take 500 mcg by mouth daily.     Multiple Vitamins-Minerals (CENTRUM SILVER ADULT 50+ PO) Take 1 tablet by mouth daily.     simvastatin (ZOCOR) 20 MG tablet Take 20 mg by mouth 3 (three) times a week.     No current facility-administered medications on file prior to visit.    Allergies  Allergen Reactions   Penicillins     REACTION: hives, rash, swelling    Physical exam:  Today's Vitals   01/06/21 1318  BP: (!) 162/89  Pulse: 82  Weight: 188 lb (85.3 kg)  Height: 5\' 11"  (1.803 m)   Body mass index is 26.22 kg/m.   Wt Readings from Last 3 Encounters:  01/06/21 188 lb (85.3 kg)  07/08/20 183 lb (83 kg)  11/06/14 184 lb (83.5 kg)     Ht Readings from Last 3 Encounters:  01/06/21 5\' 11"  (1.803 m)  07/08/20 5\' 11"  (1.803 m)  11/06/14 5\' 11"  (1.803 m)      General: The patient is awake, alert and appears not in acute distress. The patient is well groomed. Head: Normocephalic,  atraumatic. Neck is supple. Mallampati 2,  neck circumference: 17 inches .  Nasal airflow  patent.  Retrognathia is not seen.  Dental status: no dentures. Upper biological teeth.   Cardiovascular:  Regular rate and cardiac rhythm by pulse,  without distended neck veins. Respiratory: Lungs are clear to auscultation.  Skin:  With evidence of ankle edema, not rash.  Trunk: The patient's seated posture is erect.   Neurologic exam : The patient is awake and alert, oriented to place and time.   Memory subjective  described as intact.  Attention span & concentration ability appears normal.  Speech is fluent,  without  dysarthria, dysphonia or aphasia.  Mood and affect are appropriate.   Cranial nerves: no loss of smell or taste reported  Pupils are equal and briskly reactive to light. Left ptosis, cataract bilaterally removed, he had a macular pucker and epimembranous change on the right. Detachment of left lens, funduscopic exam deferred.   Extraocular movements in vertical and horizontal planes were intact and without nystagmus. No Diplopia. Visual fields by finger perimetry are intact. Hearing was intact to soft voice and finger rubbing.    Facial sensation intact to fine touch.  Facial motor strength is symmetric. the tongue and uvula move midline.  Neck ROM : rotation, tilt and flexion extension were normal for age and shoulder shrug was symmetrical.    Motor exam:  right quadriceps is smaller than left  Right leg muscle mass is smaller.  Normal tone without cog -wheeling, symmetric grip strength .   Sensory:  Fine touch  and vibration were not felt in either knee or ankles.  Proprioception tested in the upper extremities was normal.   Coordination: Rapid alternating movements in the fingers/hands were of normal speed.  The Finger-to-nose maneuver was intact without evidence of ataxia, dysmetria or tremor.   Gait and station: Patient could not rise unassisted from a seated position, he has to brace himself, the right leg is stiffer and weaker, the knee extension is weak, the hip flexion is weak, but he is able to dorsal-flexion of his feet.  He walked with a walker - rollator assistive device.   Toe and heel walk were deferred.  Deep tendon reflexes: in the  lower extremities are absent - attenuated  Babinski response was deferred .       After spending a total time of  45 minutes face to face and additional time for physical and neurologic examination, review of laboratory studies,   personal review of imaging studies, reports and results of other testing and review of referral information / records as far as provided in visit, I have established the following assessments:  1) Postpolio:  there is a progressive muscle fatigue as well as general fatigue manifesting in less exercise tolerance. He also  is at risk of falling asleep , when not physically active and not stimulated. He uses a scooter [place] [facility]. high risk of falling.   2) he takes frequent , unscheduled naps every day- EDS .highy fatigued. Did get a little bit better with sleep hygiene, not by CPAP use.  3) I was surprised about the severe level of apnea and high central component. Now treatment emergent sleep apnea. LUNA machine is very noisy.    My Plan is to proceed with:  1)  will offer attended sleep study - this time BIPAP  or ASV titration. His residual AHI is dominated by central apneas . Chin strap ordered.  2) I encourage to  return to swim, with a person in attendance.  3)  consider EMG and NCV to help explain the right leg weakness progressing now. POST POLIO?   I would like to thank Crist Infante, [address] [place],  [place] [id number] for allowing me to meet with and to take care of this pleasant patient.   In short, AAKASH HOLLOMON is presenting with CSA/ not fully responsive to CPAP- EDS and high degree of fatigue, a symptom that can be attributed to  I plan to follow up either personally or through our NP within 3 month.   CC: I will share my notes with PCP.  Electronically signed by: Larey Seat, MD 01/06/2021 2:04 PM  [facility] Board certified by The [employer] of Sleep Medicine and Diplomate of the [employer] of Sleep Medicine. Board certified In Neurology through the [place], Fellow of the [employer] of Neurology. Medical Director of [facility].

## 2021-01-07 ENCOUNTER — Ambulatory Visit (HOSPITAL_COMMUNITY)
Admission: RE | Admit: 2021-01-07 | Discharge: 2021-01-07 | Disposition: A | Discharge status: Home / Self Care | Payer: Medicare PPO | Source: Ambulatory Visit | Attending: Internal Medicine | Admitting: Internal Medicine

## 2021-01-07 ENCOUNTER — Other Ambulatory Visit: Payer: Self-pay

## 2021-01-07 DIAGNOSIS — M810 Age-related osteoporosis without current pathological fracture: Secondary | ICD-10-CM | POA: Insufficient documentation

## 2021-01-07 MED ORDER — DENOSUMAB 60 MG/ML ~~LOC~~ SOSY
PREFILLED_SYRINGE | SUBCUTANEOUS | Status: AC
  Administered 2021-01-07: 60 mg via SUBCUTANEOUS
  Filled 2021-01-07: qty 1

## 2021-01-07 MED ORDER — DENOSUMAB 60 MG/ML ~~LOC~~ SOSY: 60.0000 mg | PREFILLED_SYRINGE | Freq: Once | SUBCUTANEOUS | Status: AC

## 2021-01-07 NOTE — Progress Notes (Signed)
Subjective:  Patient ID: Eddie Sanders, male    DOB: [birth year],  MRN: [id number]  Chief Complaint  Patient presents with   Callouses    Right foot     87 y.o. male presents with the above complaint.  Patient presents with bilateral weakness in the foot right greater than left side.  Patient states that it is cold all the time.  He has a disease called post polio syndrome for which there is no treatment.  He states the right foot has always been more weaker than the left side.  It is mostly cold.  He does not have circulation problem that he is aware of.  He wanted to get a foot exam done to make sure that there is nothing else going on.  He does not wear any kind of bracing he has had multiple history of falls.  He has antalgic gait.  Pain scale is 2 out of 10.   Review of Systems: Negative except as noted in the HPI. Denies N/V/F/Ch.  Past Medical History:  Diagnosis Date   Adenomatous colon polyp    Allergic rhinitis    Arthritis    GERD (gastroesophageal reflux disease)    Hyperlipidemia    Polio 1939   Post-polio syndrome    Prostate cancer ([facility])    Skin cancer (melanoma) ([facility])    Right eye    Current Outpatient Medications:    Calcium Carb-Cholecalciferol (CALCIUM 600 + D PO), Take 1 tablet by mouth daily., Disp: , Rfl:    cholecalciferol (VITAMIN D) 1000 UNITS tablet, Take 1,000 Units by mouth daily., Disp: , Rfl:    Coenzyme Q10 200 MG capsule, Take 200 mg by mouth daily., Disp: , Rfl:    cyanocobalamin 500 MCG tablet, Take 500 mcg by mouth daily., Disp: , Rfl:    Multiple Vitamins-Minerals (CENTRUM SILVER ADULT 50+ PO), Take 1 tablet by mouth daily., Disp: , Rfl:    simvastatin (ZOCOR) 20 MG tablet, Take 20 mg by mouth 3 (three) times a week., Disp: , Rfl:   Social History   Tobacco Use  Smoking Status Some Days   Types: Cigars  Smokeless Tobacco Never  Tobacco Comments   has occasional cigar ;stopped cigarettes in 1965    Allergies  Allergen Reactions    Penicillins     REACTION: hives, rash, swelling   Objective:  There were no vitals filed for this visit. There is no height or weight on file to calculate BMI. Constitutional Well developed. Well nourished.  Vascular Dorsalis pedis pulses palpable bilaterally. Posterior tibial pulses palpable bilaterally. Capillary refill normal to all digits.  No cyanosis or clubbing noted. Pedal hair growth normal.  Neurologic Normal speech. Oriented to person, place, and time. Epicritic sensation to light touch grossly present bilaterally.  Dermatologic Nails well groomed and normal in appearance. No open wounds. No skin lesions.  Orthopedic: Gait examination shows antalgic gait right greater than left side.  Calcaneal varus noted with high arched foot structure likely due to underlying postpolio syndrome with muscle fatigue.  Patient has mild component of foot drop present   Radiographs:  Assessment:   1. Bilateral foot-drop   2. Postpoliomyelitis muscular atrophy    Plan:  Patient was evaluated and treated and all questions answered.  Muscle fatigue/weakness right greater than left side secondary to post polio disease with underlying foot drop -I explained to the patient the etiology of weakness and various treatment options were discussed.  Given the patient is a high  risk of continuing to fall down as well as progressive muscle fatigue I believe patient will benefit from bracing to allow him to ambulate properly.  Patient does have a component of foot drop to both lower extremity right greater than left side. -I will have him follow-up with Aaron Edelman for bracing to address the muscle fatigue/weakness and the foot drop bilaterally right greater than left side  No follow-ups on file.

## 2021-01-08 ENCOUNTER — Telehealth: Payer: Self-pay | Admitting: Neurology

## 2021-01-08 NOTE — Telephone Encounter (Signed)
Called the patient and advised that at this time he will hold onto the machine for CPAP. Once we complete the Bipap titration study and get the results from that we will get the new orders sent over to the company at which point they will swap the patient out with a BiPAP machine. Pt verbalized understanding and was appreciative for the call back.

## 2021-01-08 NOTE — Telephone Encounter (Signed)
Pt called wanting to know what are the proper steps in returning his CPAP machine. Pt requesting a call back.

## 2021-01-23 DIAGNOSIS — C44629 Squamous cell carcinoma of skin of left upper limb, including shoulder: Secondary | ICD-10-CM | POA: Diagnosis not present

## 2021-01-23 DIAGNOSIS — L570 Actinic keratosis: Secondary | ICD-10-CM | POA: Diagnosis not present

## 2021-01-23 DIAGNOSIS — L821 Other seborrheic keratosis: Secondary | ICD-10-CM | POA: Diagnosis not present

## 2021-01-23 DIAGNOSIS — C44319 Basal cell carcinoma of skin of other parts of face: Secondary | ICD-10-CM | POA: Diagnosis not present

## 2021-01-23 DIAGNOSIS — L814 Other melanin hyperpigmentation: Secondary | ICD-10-CM | POA: Diagnosis not present

## 2021-01-23 DIAGNOSIS — Z85828 Personal history of other malignant neoplasm of skin: Secondary | ICD-10-CM | POA: Diagnosis not present

## 2021-01-27 ENCOUNTER — Telehealth: Payer: Self-pay | Admitting: Neurology

## 2021-01-27 ENCOUNTER — Other Ambulatory Visit: Payer: Self-pay | Admitting: *Deleted

## 2021-01-27 MED ORDER — ALPRAZOLAM 1 MG PO TABS: ORAL_TABLET | ORAL | 0 refills | Status: DC

## 2021-01-27 NOTE — Telephone Encounter (Signed)
Called pt back. He states last time he was here, he was given prescription for sleeping tablets to take right before sleep study. I reviewed chart and relayed Dr. Brett Fairy sent in Xanax 1mg  tablet. Directions: "To be used for sleep lab test- as sleep inducing medication in -lab. Take one half tablet by mouth at bedtime and repeat prn if sleep onset is delayed by 1 hour or more". Aware I will have Dr. Brett Fairy e-scribe this to [id number] [place] again for him. He has Bipap study scheduled for 02/16/21.  He verbalized understanding and appreciation.

## 2021-01-27 NOTE — Telephone Encounter (Signed)
error 

## 2021-01-27 NOTE — Telephone Encounter (Signed)
Pt. Was called today to schedule his sleep study. During the call pt mentioned needing a prescription before his study. Pt requests a call back from RN at earliest convenience

## 2021-01-29 ENCOUNTER — Ambulatory Visit: Payer: Medicare PPO | Admitting: Adult Health

## 2021-02-03 ENCOUNTER — Telehealth: Payer: Self-pay | Admitting: Neurology

## 2021-02-03 NOTE — Telephone Encounter (Signed)
Pt. Called wanting to speak with Terrence Dupont for her to answer a few questions. Please call pt at earliest convenience.

## 2021-02-03 NOTE — Telephone Encounter (Signed)
Tried calling back, got busy signal

## 2021-02-03 NOTE — Telephone Encounter (Signed)
Called pt back. He needs info prior to BIPAP titration. Advised he should plan to arrive for study in sleep lab around 730-745p. They usually stop study around 530a/6am. He will finish up around then. He will plan to bring current mask to compare to what they will fit him for. He states current mask too big and hoping for something smaller.

## 2021-02-05 ENCOUNTER — Other Ambulatory Visit (HOSPITAL_BASED_OUTPATIENT_CLINIC_OR_DEPARTMENT_OTHER): Payer: Self-pay

## 2021-02-05 MED ORDER — PREVNAR 20 0.5 ML IM SUSY
PREFILLED_SYRINGE | INTRAMUSCULAR | 0 refills | Status: DC
  Filled 2021-02-05: qty 0.5, 1d supply, fill #0

## 2021-02-20 ENCOUNTER — Telehealth: Payer: Self-pay | Admitting: Neurology

## 2021-02-20 NOTE — Telephone Encounter (Signed)
Pt. Called and LVM stating he has a medication question and would like a call back from [place].

## 2021-02-20 NOTE — Telephone Encounter (Signed)
Called the patient back. Pt is stating that he has difficulty with pain and jumpiness in his legs at night that keep him up. He is concerned that this will cause problems with getting enough data for the test. I advised that the hope is the xanax will help provide enough rest that night where he may not wake as frequently. Pt verbalized understanding. He was appreciative for the call back.

## 2021-02-24 ENCOUNTER — Ambulatory Visit (INDEPENDENT_AMBULATORY_CARE_PROVIDER_SITE_OTHER): Payer: Medicare PPO | Admitting: Neurology

## 2021-02-24 ENCOUNTER — Ambulatory Visit: Payer: Medicare PPO | Admitting: Neurology

## 2021-02-24 ENCOUNTER — Other Ambulatory Visit: Payer: Self-pay

## 2021-02-24 DIAGNOSIS — G4731 Primary central sleep apnea: Secondary | ICD-10-CM

## 2021-02-24 DIAGNOSIS — J3089 Other allergic rhinitis: Secondary | ICD-10-CM

## 2021-02-24 DIAGNOSIS — G14 Postpolio syndrome: Secondary | ICD-10-CM

## 2021-02-24 DIAGNOSIS — G4739 Other sleep apnea: Secondary | ICD-10-CM

## 2021-02-25 ENCOUNTER — Telehealth: Payer: Self-pay | Admitting: Neurology

## 2021-02-25 NOTE — Telephone Encounter (Signed)
Called and spoke with pt. Speech clear and he is alert. Able to answer questions. He took 1 tablet of xanax prior to sleep study last night. He is very tired today. Advised this is most likely d/t medication. He is feeling ok, just tired. He will continue to monitor. If sx persist, he will let us know. Aware Dr. Brett Fairy will review results. As soon as we hear from her, we will call to discuss. He verbalized understanding.

## 2021-02-25 NOTE — Telephone Encounter (Signed)
Pt's wife called and left a voicemail stating that her husband had a sleep study last night and he's been asleep all day and it is concerning her. She also stated he took a medication and would like to know if that is causing this. Myriam Jacobson spoke to pt last week about the medication. Please call pt's wife at earliest convenience.

## 2021-02-28 DIAGNOSIS — E785 Hyperlipidemia, unspecified: Secondary | ICD-10-CM | POA: Diagnosis not present

## 2021-02-28 DIAGNOSIS — B91 Sequelae of poliomyelitis: Secondary | ICD-10-CM | POA: Diagnosis not present

## 2021-02-28 DIAGNOSIS — N529 Male erectile dysfunction, unspecified: Secondary | ICD-10-CM | POA: Diagnosis not present

## 2021-02-28 DIAGNOSIS — Z809 Family history of malignant neoplasm, unspecified: Secondary | ICD-10-CM | POA: Diagnosis not present

## 2021-02-28 DIAGNOSIS — Z7409 Other reduced mobility: Secondary | ICD-10-CM | POA: Diagnosis not present

## 2021-02-28 DIAGNOSIS — G629 Polyneuropathy, unspecified: Secondary | ICD-10-CM | POA: Diagnosis not present

## 2021-02-28 DIAGNOSIS — M1990 Unspecified osteoarthritis, unspecified site: Secondary | ICD-10-CM | POA: Diagnosis not present

## 2021-02-28 DIAGNOSIS — M8960 Osteopathy after poliomyelitis, unspecified site: Secondary | ICD-10-CM | POA: Diagnosis not present

## 2021-02-28 DIAGNOSIS — R030 Elevated blood-pressure reading, without diagnosis of hypertension: Secondary | ICD-10-CM | POA: Diagnosis not present

## 2021-03-05 ENCOUNTER — Telehealth: Payer: Self-pay | Admitting: Neurology

## 2021-03-05 DIAGNOSIS — J3089 Other allergic rhinitis: Secondary | ICD-10-CM | POA: Insufficient documentation

## 2021-03-05 DIAGNOSIS — G4731 Primary central sleep apnea: Secondary | ICD-10-CM

## 2021-03-05 DIAGNOSIS — G14 Postpolio syndrome: Secondary | ICD-10-CM | POA: Insufficient documentation

## 2021-03-05 DIAGNOSIS — G4719 Other hypersomnia: Secondary | ICD-10-CM

## 2021-03-05 DIAGNOSIS — R5383 Other fatigue: Secondary | ICD-10-CM

## 2021-03-05 DIAGNOSIS — G4739 Other sleep apnea: Secondary | ICD-10-CM

## 2021-03-05 NOTE — Progress Notes (Signed)
BiPAP was initiated at 17/20 cmH20 with heated humidity per AASM standards and pressure was advanced to BiPAP ST 23/20 cm water, ST13. When t a reduction of the AHI to 0 was reached. ALL APNEAS WERE CENTRAL, NO APNEAS WERE SEEN DURING REM SUPINE SLEEP. DIAGNOSIS 1. Central Sleep Apnea responded to BIPAP ST 23/ 20 with 13 ST and large size F and P mask, SIMPLUS FFM- and a chin strap.  2. Periodic Limb Movement Disorder only seen in the last 90 minutes of this titration, while at goal pressure for apnea treatment., but only few arousals were related.   3. EKG in regular rhythm with some isolated PVCs.    PLANS/RECOMMENDATIONS: 1. Starting BiPAP ST 23/20 cm water with ST13/minute.   2. BiPAP therapy compliance is defined as 4 hours of nightly use.   DISCUSSION: follow up in 30-60 days of therapy, with sleep clinic NP or MD .

## 2021-03-05 NOTE — Telephone Encounter (Signed)
DIAGNOSIS Central Sleep Apnea responded to BIPAP ST 23/ 20 with 13 ST and large size F and P mask, SIMPLUS FFM- and a chin strap.  Periodic Limb Movement Disorder only seen in the last 90 minutes of this titration, while at goal pressure for apnea treatment., but only few arousals were related.   EKG in regular rhythm with some isolated PVCs.      PLANS/RECOMMENDATIONS: Starting BiPAP ST 23/20 cm water with ST13/minute.   BiPAP therapy compliance is defined as 4 hours of nightly use.    DISCUSSION: follow up in 30-60 days of therapy, with sleep clinic NP or MD .

## 2021-03-05 NOTE — Procedures (Signed)
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 He was seen last  in a RV on 01/06/2021. Patient had severe Sleep apnea on HST, central apneas were suspected, he then underwent CPAP titration and could not tolerate CPAP well. Has EDS. I was surprised about the severe level of apnea and high central component. Now treatment emergent sleep apnea. LUNA machine is very noisy.   My Plan is to proceed with: 1)  I will offer attended sleep study - this time BIPAP  or ASV titration. His residual AHI is dominated by central apneas Chin strap ordered.  2) I encourage to return to swim, with a person in attendance.  3)  consider EMG and NCV to help explain the right leg weakness progressing now. POST POLIO? Here for attended full night retitration 02-24-2021:   The patient endorsed the Epworth Sleepiness Scale at 14 points.   The patient's weight 183 pounds with a height of 71 (inches), resulting in a BMI of 25.6 kg/m2. The patient's neck circumference measured 17 inches.  CURRENT MEDICATIONS: Calcium+D, Vitamin D, CoQ-10, Cyanocobalamin, Centrum Silver, Zocor   PROCEDURE:  This is a multichannel digital polysomnogram utilizing the SomnoStar 11.2 system.  Electrodes and sensors were applied and monitored per AASM Specifications.   EEG, EOG, Chin and Limb EMG, were sampled at 200 Hz.  ECG, Snore and Nasal Pressure, Thermal Airflow, Respiratory Effort, CPAP Flow and Pressure, Oximetry was sampled at 50 Hz. Digital video and audio were recorded.      CPAP started at last pressure of 15 cm water and 2 cm EPR- Simplus FFM leaks air ,the  chinstrap didn't change much of that . BiPAP was initiated at 17/20 cmH20 with heated humidity per AASM standards and pressure was advanced to BiPAP  ST 23/20 cm water, ST13. cmH20 because of hypopneas, apneas and desaturations.  At a PAP pressure of 0 cmH20, there was a reduction of the AHI to 0 with improvement of sleep apnea. ALL APNEAS WERE CENTRAL, NO APNEAS WERE SEEN DURING REM SUPINE SLEEP.   Lights Out was at 22:30 and Lights On at 05:18. Total recording time (TRT) was 408.5 minutes, with a total sleep time (TST) of 392 minutes. The patient's sleep latency was 7 minutes. REM latency was 115.5 minutes.  The sleep efficiency was 96. %.    SLEEP ARCHITECTURE: WASO (Wake after sleep onset) was 9.5 minutes.  There were 22 minutes in Stage N1, 290 minutes Stage N2, 11 minutes Stage N3 and 69 minutes in Stage REM.  The percentage of Stage N1 was 5.6%, Stage N2 was 74.%, Stage N3 was 2.8% and Stage R (REM sleep) was 17.6%.  The arousals were noted as: 67 were spontaneous, 3 were associated with PLMs, 27 were associated with respiratory events.     RESPIRATORY ANALYSIS:  There was a total of 179 respiratory events: 0 obstructive apneas, 117 central apneas and 7 mixed apneas with a total of 124 apneas and an apnea index (AI) of 19.0 /hour. There were 55 hypopneas with a hypopnea index of 8.4/hour.   The total APNEA/HYPOPNEA INDEX (AHI) was 27.4 /hour. 4 events occurred in REM sleep and 175 events in NREM. The REM AHI  was 3.5 /hour versus a non-REM AHI of 32.5 /hour.  The patient spent 392 minutes of total sleep time in the supine position and 0 minutes in non-supine.  The supine AHI was 27.4/h.  OXYGEN SATURATION & C02:  The baseline 02 saturation was 95%, with the lowest being 82%. Time spent below 89% saturation equaled 11 minutes.  PERIODIC LIMB MOVEMENTS:  The patient had a total of 122 Periodic Limb Movements. The Periodic Limb Movement (PLM) Arousal index was 0.5 /hour. PLMs clustered in the last 90 minutes of sleep.  The arousals were noted as: 67 were spontaneous, 3 were associated with PLMs, 27 were associated with respiratory events.  Audio and video analysis did not show any abnormal or unusual movements, behaviors, phonations or vocalizations.  EKG- regular rate.    DIAGNOSIS Central Sleep Apnea responded to BIPAP ST 23/ 20 with 13 ST and large size F and P mask, SIMPLUS FFM- and a chin strap.  Periodic Limb Movement Disorder only seen in the last 90 minutes of this titration, while at goal pressure for apnea treatment., but only few arousals were related.   EKG in regular rhythm with some isolated PVCs.    PLANS/RECOMMENDATIONS: Starting BiPAP ST 23/20 cm water with ST13/minute.   BiPAP therapy compliance is defined as 4 hours of nightly use.   DISCUSSION: follow up in 30-60 days of therapy, with sleep clinic NP or MD .   A follow up appointment will be scheduled in the Sleep Clinic at [facility].   Please call [phone] with any questions.     I certify that I have reviewed the entire raw data recording prior to the issuance of this report in accordance with the Standards of Accreditation of the American Academy of Sleep Medicine (AASM)  Larey Seat, M.D. Diplomat, Tax adviser of Neurology Diplomat, Tax adviser of Sleep Medicine Market researcher, [employer] [facility] at [employer]

## 2021-03-06 ENCOUNTER — Telehealth: Payer: Self-pay | Admitting: *Deleted

## 2021-03-06 NOTE — Telephone Encounter (Signed)
Pt. Left a voice mail requesting a call back from [facility] about his sleep study results.

## 2021-03-06 NOTE — Telephone Encounter (Signed)
-----   Message from Larey Seat, MD sent at 03/05/2021  6:24 PM EST ----- BiPAP was initiated at 17/20 cmH20 with heated humidity per AASM standards and pressure was advanced to BiPAP ST 23/20 cm water, ST13. When t a reduction of the AHI to 0 was reached. ALL APNEAS WERE CENTRAL, NO APNEAS WERE SEEN DURING REM SUPINE SLEEP. DIAGNOSIS 1. Central Sleep Apnea responded to BIPAP ST 23/ 20 with 13 ST and large size F and P mask, SIMPLUS FFM- and a chin strap.  2. Periodic Limb Movement Disorder only seen in the last 90 minutes of this titration, while at goal pressure for apnea treatment., but only few arousals were related.   3. EKG in regular rhythm with some isolated PVCs.    PLANS/RECOMMENDATIONS: 1. Starting BiPAP ST 23/20 cm water with ST13/minute.   2. BiPAP therapy compliance is defined as 4 hours of nightly use.   DISCUSSION: follow up in 30-60 days of therapy, with sleep clinic NP or MD .

## 2021-03-06 NOTE — Telephone Encounter (Signed)
LVM for pt to call about results. °

## 2021-03-06 NOTE — Telephone Encounter (Signed)
Took call from phone staff and spoke with pt. Relayed results. He verbalized understanding. Has appt scheduled for 04/28/21 but we will need to push that out. Asked check out to contact patient back to r/s for later date to ensure we have 61-90days of data.

## 2021-03-18 DIAGNOSIS — M79671 Pain in right foot: Secondary | ICD-10-CM | POA: Diagnosis not present

## 2021-03-18 DIAGNOSIS — E119 Type 2 diabetes mellitus without complications: Secondary | ICD-10-CM | POA: Diagnosis not present

## 2021-03-18 DIAGNOSIS — B351 Tinea unguium: Secondary | ICD-10-CM | POA: Diagnosis not present

## 2021-03-18 DIAGNOSIS — M79672 Pain in left foot: Secondary | ICD-10-CM | POA: Diagnosis not present

## 2021-03-18 DIAGNOSIS — L84 Corns and callosities: Secondary | ICD-10-CM | POA: Diagnosis not present

## 2021-03-20 ENCOUNTER — Encounter (HOSPITAL_COMMUNITY): Payer: Medicare PPO

## 2021-03-20 ENCOUNTER — Other Ambulatory Visit (HOSPITAL_COMMUNITY): Payer: Self-pay | Admitting: Internal Medicine

## 2021-03-20 DIAGNOSIS — M79604 Pain in right leg: Secondary | ICD-10-CM

## 2021-03-21 ENCOUNTER — Ambulatory Visit (HOSPITAL_COMMUNITY)
Admission: RE | Admit: 2021-03-21 | Discharge: 2021-03-21 | Disposition: A | Discharge status: Home / Self Care | Payer: Medicare PPO | Source: Ambulatory Visit | Attending: Internal Medicine | Admitting: Internal Medicine

## 2021-03-21 ENCOUNTER — Other Ambulatory Visit: Payer: Self-pay

## 2021-03-21 DIAGNOSIS — M79605 Pain in left leg: Secondary | ICD-10-CM | POA: Diagnosis not present

## 2021-03-21 DIAGNOSIS — M79604 Pain in right leg: Secondary | ICD-10-CM | POA: Insufficient documentation

## 2021-03-22 DIAGNOSIS — M19011 Primary osteoarthritis, right shoulder: Secondary | ICD-10-CM | POA: Diagnosis not present

## 2021-03-22 DIAGNOSIS — M19012 Primary osteoarthritis, left shoulder: Secondary | ICD-10-CM | POA: Diagnosis not present

## 2021-03-24 DIAGNOSIS — M79671 Pain in right foot: Secondary | ICD-10-CM | POA: Diagnosis not present

## 2021-03-24 DIAGNOSIS — M79672 Pain in left foot: Secondary | ICD-10-CM | POA: Diagnosis not present

## 2021-04-21 ENCOUNTER — Telehealth: Payer: Self-pay | Admitting: Neurology

## 2021-04-21 ENCOUNTER — Telehealth: Payer: Self-pay

## 2021-04-21 DIAGNOSIS — G4733 Obstructive sleep apnea (adult) (pediatric): Secondary | ICD-10-CM

## 2021-04-21 DIAGNOSIS — G4731 Primary central sleep apnea: Secondary | ICD-10-CM

## 2021-04-21 NOTE — Telephone Encounter (Addendum)
Pt set up with current BIPAP ST 04/07/2021. Needed 61-90 day f/u (between 06/07/21-07/06/21). Current appt set for 06/17/21 at 1:30pm with Amy Lomax,NP.  ?After reviewing recent data below, he is having a lot of air leaks from mask. He needs to do mask refit with [facility]. This would be first step.  ? ?I called pt and relayed above info. He is agreeable to mask re-fit. I sent community message to Ocie [facility] that order place/f/u with pt.  ? ? ? ? ? ? ?

## 2021-04-21 NOTE — Telephone Encounter (Signed)
At 9:05 pt left vm asking for a call to discuss mask issues, when pt was called for a better understanding pt then elaborated and said he would actually like a call to discuss other options outside of the Bipap, please call pt to discuss(he was reminded of his upcoming appointment in the month of May). ?

## 2021-04-21 NOTE — Telephone Encounter (Signed)
Alia also sent message :"Pt Eddie Sanders to have an appt scheduled with Dr. Brett Fairy- has concerns about machine and would like to be seen asap. Looks like he had a follow up on 06/05/21 scheduled but it was cancelled. Can someone please reach out to this patient to get a follow up visit scheduled? " ?

## 2021-04-21 NOTE — Telephone Encounter (Signed)
Pt Eddie Sanders to have an appt scheduled with Dr. Brett Fairy- has concerns about machine and would like to be seen asap. Looks like he had a follow up on 06/05/21 scheduled but it was cancelled. Can someone please reach out to this patient to get a follow up visit scheduled?  ?

## 2021-04-21 NOTE — Telephone Encounter (Signed)
See other phone note

## 2021-04-21 NOTE — Addendum Note (Signed)
Addended by: Wyvonnia Lora on: 04/21/2021 03:11 PM ? ? Modules accepted: Orders ? ?

## 2021-04-23 DIAGNOSIS — M810 Age-related osteoporosis without current pathological fracture: Secondary | ICD-10-CM | POA: Diagnosis not present

## 2021-04-23 DIAGNOSIS — E1122 Type 2 diabetes mellitus with diabetic chronic kidney disease: Secondary | ICD-10-CM | POA: Diagnosis not present

## 2021-04-23 DIAGNOSIS — G14 Postpolio syndrome: Secondary | ICD-10-CM | POA: Diagnosis not present

## 2021-04-23 DIAGNOSIS — J302 Other seasonal allergic rhinitis: Secondary | ICD-10-CM | POA: Diagnosis not present

## 2021-04-23 DIAGNOSIS — M79604 Pain in right leg: Secondary | ICD-10-CM | POA: Diagnosis not present

## 2021-04-23 DIAGNOSIS — E1159 Type 2 diabetes mellitus with other circulatory complications: Secondary | ICD-10-CM | POA: Diagnosis not present

## 2021-04-23 DIAGNOSIS — M6281 Muscle weakness (generalized): Secondary | ICD-10-CM | POA: Diagnosis not present

## 2021-04-23 DIAGNOSIS — M79605 Pain in left leg: Secondary | ICD-10-CM | POA: Diagnosis not present

## 2021-04-23 DIAGNOSIS — I872 Venous insufficiency (chronic) (peripheral): Secondary | ICD-10-CM | POA: Diagnosis not present

## 2021-04-28 ENCOUNTER — Ambulatory Visit: Payer: Medicare PPO | Admitting: Family Medicine

## 2021-05-05 NOTE — Telephone Encounter (Signed)
Pt said having trouble with mask for BIPAP. Have been to [facility] to have mask changed 4 times. Mask still not fitting correctly. Would like a call from the nurse. ?

## 2021-05-05 NOTE — Telephone Encounter (Signed)
Called the patient back. He has been to [facility] for 3/4 different mask refits and tried chinstraps. When he goes into the visit, he states that will adjust the mask and try with him and in that short amount of time it will feel alright but that it never gets to the point where it is blowing hard on face like it does when he takes it home to use the machine.  ? ? ?His main problem continues to be the mask not fitting properly. He is at a loss at what the next steps would be. I have also contacted the management team to have them look into the notes in his chart to see what information can be provided to [place] on his time spent there with the mask refits. I have advised the pt I will also take this information to Dr Brett Fairy and see what she would recommend our next steps to be. ?

## 2021-05-06 NOTE — Telephone Encounter (Signed)
Called the patient back and advised that I was in contact with the management team through [facility] who did confirm that he had been in multiple times to the company for mask refits. He states that he will have another RT reach out who will have the time to spend with the pt and work with him longer.  ?Pt verbalized understanding. ?Pt had no questions at this time but was encouraged to call back if questions arise. ? ?

## 2021-05-19 ENCOUNTER — Telehealth: Payer: Self-pay | Admitting: Neurology

## 2021-05-19 NOTE — Telephone Encounter (Signed)
Pt has called to report the mask won't stay on.  Pt states when the pressure is just right the mask then begins to shift and air is getting out of mask.  Pt is asking to be called by RN. ?

## 2021-05-19 NOTE — Telephone Encounter (Signed)
Called and spoke with pt. He continues to have problems w/ BIPAP mask. He ended call by mistake. I tried calling back x2 but got busy signal. Called third time, was able to speak with him. He has continued to work with mask x1 hr after placing but he ends up giving up. He goes to sleep at that point. Feels he gets a good night sleep. ? ?He got in contact with a sleep couch (mask concerns/compliance issues). Spoke w/ technician who asked him if he has tried a nasal mask. He told technician that he has not. Tech offered for him to try this but he wanted to check w/ Dr. Brett Fairy first. Advised this would be ok to try. He should call sleep tech back to set up in person appt to get fit for this/learn how to use this. He verbalized understanding. Sleep Tech phone#[phone].  ?

## 2021-05-27 DIAGNOSIS — Z961 Presence of intraocular lens: Secondary | ICD-10-CM | POA: Diagnosis not present

## 2021-05-27 DIAGNOSIS — E119 Type 2 diabetes mellitus without complications: Secondary | ICD-10-CM | POA: Diagnosis not present

## 2021-05-27 DIAGNOSIS — H524 Presbyopia: Secondary | ICD-10-CM | POA: Diagnosis not present

## 2021-06-02 DIAGNOSIS — D485 Neoplasm of uncertain behavior of skin: Secondary | ICD-10-CM | POA: Diagnosis not present

## 2021-06-02 DIAGNOSIS — L570 Actinic keratosis: Secondary | ICD-10-CM | POA: Diagnosis not present

## 2021-06-02 DIAGNOSIS — L720 Epidermal cyst: Secondary | ICD-10-CM | POA: Diagnosis not present

## 2021-06-02 DIAGNOSIS — Z85828 Personal history of other malignant neoplasm of skin: Secondary | ICD-10-CM | POA: Diagnosis not present

## 2021-06-02 DIAGNOSIS — D044 Carcinoma in situ of skin of scalp and neck: Secondary | ICD-10-CM | POA: Diagnosis not present

## 2021-06-02 DIAGNOSIS — D225 Melanocytic nevi of trunk: Secondary | ICD-10-CM | POA: Diagnosis not present

## 2021-06-02 DIAGNOSIS — L905 Scar conditions and fibrosis of skin: Secondary | ICD-10-CM | POA: Diagnosis not present

## 2021-06-02 DIAGNOSIS — L821 Other seborrheic keratosis: Secondary | ICD-10-CM | POA: Diagnosis not present

## 2021-06-02 DIAGNOSIS — C4441 Basal cell carcinoma of skin of scalp and neck: Secondary | ICD-10-CM | POA: Diagnosis not present

## 2021-06-02 DIAGNOSIS — L578 Other skin changes due to chronic exposure to nonionizing radiation: Secondary | ICD-10-CM | POA: Diagnosis not present

## 2021-06-05 ENCOUNTER — Ambulatory Visit: Payer: Medicare PPO | Admitting: Neurology

## 2021-06-09 DIAGNOSIS — M5416 Radiculopathy, lumbar region: Secondary | ICD-10-CM | POA: Diagnosis not present

## 2021-06-09 DIAGNOSIS — M6281 Muscle weakness (generalized): Secondary | ICD-10-CM | POA: Diagnosis not present

## 2021-06-09 DIAGNOSIS — M79606 Pain in leg, unspecified: Secondary | ICD-10-CM | POA: Diagnosis not present

## 2021-06-09 DIAGNOSIS — M5136 Other intervertebral disc degeneration, lumbar region: Secondary | ICD-10-CM | POA: Diagnosis not present

## 2021-06-12 DIAGNOSIS — M5416 Radiculopathy, lumbar region: Secondary | ICD-10-CM | POA: Diagnosis not present

## 2021-06-17 ENCOUNTER — Encounter: Payer: Self-pay | Admitting: Family Medicine

## 2021-06-17 ENCOUNTER — Ambulatory Visit: Payer: Medicare PPO | Admitting: Family Medicine

## 2021-06-17 VITALS — BP 134/84 | HR 99 | Ht 71.0 in

## 2021-06-17 DIAGNOSIS — G4731 Primary central sleep apnea: Secondary | ICD-10-CM

## 2021-06-17 NOTE — Patient Instructions (Signed)
Please continue using your BiPAP regularly. While your insurance requires that you use BiPAP at least 4 hours each night on 70% of the nights, I recommend, that you not skip any nights and use it throughout the night if you can. Getting used to BiPAP and staying with the treatment long term does take time and patience and discipline. Untreated obstructive sleep apnea when it is moderate to severe can have an adverse impact on cardiovascular health and raise her risk for heart disease, arrhythmias, hypertension, congestive heart failure, stroke and diabetes. Untreated obstructive sleep apnea causes sleep disruption, nonrestorative sleep, and sleep deprivation. This can have an impact on your day to day functioning and cause daytime sleepiness and impairment of cognitive function, memory loss, mood disturbance, and problems focussing. Using BiPAP regularly can improve these symptoms. ? ? ?Please continue to use BiPAP every night. Shoot for at least 4 hours of usage each night. Make sure mask is snug and monitor for leak. Reach out to [facility] if you continue to have trouble with your mask. I will discuss report with Dr Brett Fairy and determine what setting changes need to be made once we make sure we have the correct report.  ? ?Follow up with Dr Brett Fairy in 3-4 months.  ?

## 2021-06-17 NOTE — Progress Notes (Signed)
? ? ?PATIENT: Eddie Sanders ?DOB: December 27, 1928 ? ?REASON FOR VISIT: follow up ?HISTORY FROM: patient ? ?Chief Complaint  ?Patient presents with  ? Follow-up  ?  RM 16, alone. Last seen 01/06/21. Here for initail BIPAP f/u. Pt unable to stand to obtain weight. In electric scooter in office today. He has worked with Engineer, maintenance Orthoptist over [place]). Wanted to try nasal mask but they advised him against this option. Worried about pressure going into nose. Got a mask that went over mouth/under nose. He still feels like it is too much pressure. Turned old cpap machine back into [facility].   ?  ? ?HISTORY OF PRESENT ILLNESS: ? ?06/17/21 ALL: ?Eddie Sanders is a 88 y.o. male here today for follow up for complex sleep apnea. He was seen initially in 07/08/2021 for worsening daytime sleepiness. HST 08/05/2020 showed severe complex sleep apnea. He was started on AutoPAP through [facility]. He had significant difficulty tolerating his machine and could not find a mask that was comfortable after multiple attempts. He returned for CPAP titration 10/23/2020 that showed need for high pressure setting of 17cmH20 reducing AHI to 4.8/hr. BiPAP recommended but CPAP continued at this time. He continued to have difficulty tolerating pressure setting and mask.  ? ?He had a BiPAP titration 02/24/2021 that showed resolution of central apneas at BiPAP ST 23/20cmHSO with 13 ST. Compliance report shows he used BiPAP 23/30 (77%) days from 05/19/2021-06/17/2021. He used BiPAP only 9/30 days for greater than 4 hours (30%). Average usage was 3 hours and 4 minutes. Residual AHI was 24.8, however, I suspect this is artifact as leak is 100l/min.  ? ?He reports that he has not been able to tolerate pressure settings. He has worked with Holley Raring at [facility] to find a good mask fit and assess pressure settings. He reports that everything checked out ok with mask fit and pressure settings. He was not able to tolerate the nasal mask. He reports that she was unable to stay  for any prolonged period of time. He feels that pressure continues to increase as the night goes and he has more difficulty trouble tolerating it. He is using FFM with chin strap.  ? ? ? ?HISTORY: (copied from Dr Dohmeier's previous notes) ? 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 He is a post polio patient as well,  he underwent a HST sleep study with [facility] but he has trouble to use CPAP now.  The patient does not wear dentures, but he notices that the fit of the mask and the air leakage has been difficult to tolerate and if he makes his mask too tight he has discomfort as well.  He underwent first a home sleep test on [id number] which revealed severe apnea but it was more than half of the apneas appear to be central.   ?He was then invited for an attended Pap titration to see if he can use a CPAP machine : ?CPAP was initiated at 5 cmH2O and was advanced to 17 cmH2O.  There was a reduction of his AHI to 4.8 with some improvement of sleep apnea.  However he used a small to midsize a [place]a fullface mask with nasal cushions while he was in the sleep lab and he stated that this was not the mask he was later provided. ?He last used a Advertising account planner in large, another  FFM. Another Resmed model the F 20 in large was given-his problem is his mouth drops open and lower rim of the mask will be in his mouth.  ?  ?CC: 01-06-2021; pt alone, here because he has been unable to tolerate using the cpap. He is unable to tolerate the pressure on the machine and blows his mask off despite making changes. He has tried 3 diff full face masks,  but no chin strap (?).  ?  ?In light of Professor Gruenberg's baseline study which was a home sleep test and his AHI of 58.5 with so many central apneas being mentioned that I do wonder if we should switch him to a BiPAP study.  I also would love to get him to use a chinstrap this would  allow him to use nasal mask nasal pillow anything he does not need to cover his fullface.  I looked today at his fatigue severity score and he said I can swim two quarters of a mile and 25 minutes without stopping or I am tired but not fatigued.  I can walk a quarter at of a mile in less than 27 minutes but I am fatigued.  His Epworth sleepiness score remains higher at 14 and his fatigue severity is elevated which we have partially attributed to his postpolio condition. ? ?At this time I think we will serve him better using a BiPAP and also a chinstrap. ? ?Compliance data the patient has used over the last 30 days with the machine for about 1415 days.  Average pressure was 16 cmH2O average leak 27.7 L/min, average AHI was 14.2/h of which central apneas are 9.4/h.  So clearly this patient needs BiPAP. ?  ?For a sleep consultation. He reports an increasing degree of daytime sleepiness.  ?Chief concern according to patient : Presents today for concerns of falling asleep when he shouldn't be. States that he will doze off at a concert or could be talking to friends and nod off. His legs give way, right more than left.  ?  Eddie Sanders  has a past medical history of Adenomatous colon polyp, Allergic rhinitis, Osteoarthritis, GERD (gastroesophageal reflux disease), Hyperlipidemia, tonsillectomy and adenoid ectomy in 1939, Poliomyelitis in 1939, Post-polio syndrome, Gait Disorder since 1964,   Prostate cancer ([place]), and Skin cancer (melanoma) ([place]). ?   ?Sleep relevant medical history:  Tonsillectomy, post polio .   ? Family medical /sleep history: no other family member on CPAP with OSA, insomnia, sleep walkers.  ?  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  and lives in a IT [place] .  ?Family status is married , wife is one year older, with one daughter 36 34) and  adult  52 and 28 year old grandchildren, several great-grandchildren. Marland Kitchen   ?The patient currently smokes cigars ( smoked one box a year- that's 54 a year) and none since 2016. ?Tobacco use- remote.  ?ETOH use - 3 ounces wine rarely ,one scotch a day at 4-5 Pm, 1.5 ounces. Caffeine intake in form of Coffee( 2 cups of black coffee in AM ) .Regular exercise in form of swimming, [employer],  Now at [employer].  Hobbies : Reading and Movies. ?  ?Sleep habits are as follows: The patient's dinner time is between 5-7 PM. The patient goes to bed at 11 PM and continues to sleep for 5-6.5 hours, wakes for some bathroom breaks.   ?The preferred sleep position is supine  on a knee pillow and footrest pillow, with the support of 3 pillows.  ?Dreams are reportedly rare now.  ?6-7  AM is the usual rise time.  ?The patient wakes up spontaneously.  ?He  reports not feeling refreshed or restored in AM, with symptoms such as dry mouth and residual fatigue.  ?Naps are taken frequently, unscheduled , and lasting from 5-30 minutes and are more refreshing than nocturnal sleep.  ? ? ?REVIEW OF SYSTEMS: Out of a complete 14 system review of symptoms, the patient complains only of the following symptoms, back pain, daytime sleepiness  and all other reviewed systems are negative. ? ?ESS: 13/24, previously 14/2,  ? ?ALLERGIES: ?Allergies  ?Allergen Reactions  ? Penicillins   ?  REACTION: hives, rash, swelling  ? ? ?HOME MEDICATIONS: ?Outpatient Medications Prior to Visit  ?Medication Sig Dispense Refill  ? ALPRAZolam (XANAX) 1 MG tablet To be used for sleep lab test- as sleep inducing medication in -lab. Take one half tablet by mouth at bedtime and repeat prn if sleep onset is delayed by 1 hour or more. 5 tablet 0  ? Calcium Carb-Cholecalciferol (CALCIUM 600 + D PO) Take 1 tablet by mouth daily.    ? cholecalciferol (VITAMIN D) 1000 UNITS tablet Take 1,000 Units by mouth daily.    ? Coenzyme Q10 200 MG capsule Take 200 mg by mouth daily.    ? cyanocobalamin 500 MCG tablet Take 500 mcg by mouth daily.    ? Multiple  Vitamins-Minerals (CENTRUM SILVER ADULT 50+ PO) Take 1 tablet by mouth daily.    ? pneumococcal 20-valent conjugate vaccine (PREVNAR 20) 0.5 ML injection Inject into the muscle. 0.5 mL 0  ? simvastatin (ZOCOR) 2

## 2021-06-20 ENCOUNTER — Emergency Department (HOSPITAL_BASED_OUTPATIENT_CLINIC_OR_DEPARTMENT_OTHER): Payer: Medicare PPO

## 2021-06-20 ENCOUNTER — Emergency Department (HOSPITAL_BASED_OUTPATIENT_CLINIC_OR_DEPARTMENT_OTHER)
Admission: EM | Admit: 2021-06-20 | Discharge: 2021-06-20 | Disposition: A | Discharge status: Home / Self Care | Payer: Medicare PPO | Source: Home / Self Care | Attending: Emergency Medicine | Admitting: Emergency Medicine

## 2021-06-20 ENCOUNTER — Other Ambulatory Visit: Payer: Self-pay

## 2021-06-20 ENCOUNTER — Encounter (HOSPITAL_BASED_OUTPATIENT_CLINIC_OR_DEPARTMENT_OTHER): Payer: Self-pay

## 2021-06-20 DIAGNOSIS — R443 Hallucinations, unspecified: Secondary | ICD-10-CM | POA: Insufficient documentation

## 2021-06-20 DIAGNOSIS — K219 Gastro-esophageal reflux disease without esophagitis: Secondary | ICD-10-CM | POA: Diagnosis present

## 2021-06-20 DIAGNOSIS — Z043 Encounter for examination and observation following other accident: Secondary | ICD-10-CM | POA: Diagnosis not present

## 2021-06-20 DIAGNOSIS — J189 Pneumonia, unspecified organism: Secondary | ICD-10-CM | POA: Diagnosis not present

## 2021-06-20 DIAGNOSIS — G9340 Encephalopathy, unspecified: Secondary | ICD-10-CM | POA: Diagnosis not present

## 2021-06-20 DIAGNOSIS — Z7401 Bed confinement status: Secondary | ICD-10-CM | POA: Diagnosis not present

## 2021-06-20 DIAGNOSIS — N3001 Acute cystitis with hematuria: Secondary | ICD-10-CM | POA: Diagnosis present

## 2021-06-20 DIAGNOSIS — Z8249 Family history of ischemic heart disease and other diseases of the circulatory system: Secondary | ICD-10-CM | POA: Diagnosis not present

## 2021-06-20 DIAGNOSIS — G4733 Obstructive sleep apnea (adult) (pediatric): Secondary | ICD-10-CM | POA: Diagnosis present

## 2021-06-20 DIAGNOSIS — G928 Other toxic encephalopathy: Secondary | ICD-10-CM | POA: Diagnosis present

## 2021-06-20 DIAGNOSIS — R7309 Other abnormal glucose: Secondary | ICD-10-CM | POA: Insufficient documentation

## 2021-06-20 DIAGNOSIS — R9431 Abnormal electrocardiogram [ECG] [EKG]: Secondary | ICD-10-CM | POA: Diagnosis not present

## 2021-06-20 DIAGNOSIS — Z8546 Personal history of malignant neoplasm of prostate: Secondary | ICD-10-CM | POA: Diagnosis not present

## 2021-06-20 DIAGNOSIS — F05 Delirium due to known physiological condition: Secondary | ICD-10-CM | POA: Diagnosis not present

## 2021-06-20 DIAGNOSIS — R404 Transient alteration of awareness: Secondary | ICD-10-CM | POA: Diagnosis not present

## 2021-06-20 DIAGNOSIS — R531 Weakness: Secondary | ICD-10-CM | POA: Diagnosis not present

## 2021-06-20 DIAGNOSIS — G9341 Metabolic encephalopathy: Secondary | ICD-10-CM | POA: Diagnosis present

## 2021-06-20 DIAGNOSIS — Z9841 Cataract extraction status, right eye: Secondary | ICD-10-CM | POA: Diagnosis not present

## 2021-06-20 DIAGNOSIS — R442 Other hallucinations: Secondary | ICD-10-CM | POA: Diagnosis not present

## 2021-06-20 DIAGNOSIS — Z79899 Other long term (current) drug therapy: Secondary | ICD-10-CM | POA: Diagnosis not present

## 2021-06-20 DIAGNOSIS — E785 Hyperlipidemia, unspecified: Secondary | ICD-10-CM | POA: Diagnosis present

## 2021-06-20 DIAGNOSIS — W06XXXA Fall from bed, initial encounter: Secondary | ICD-10-CM | POA: Diagnosis not present

## 2021-06-20 DIAGNOSIS — E1165 Type 2 diabetes mellitus with hyperglycemia: Secondary | ICD-10-CM | POA: Diagnosis present

## 2021-06-20 DIAGNOSIS — G4731 Primary central sleep apnea: Secondary | ICD-10-CM | POA: Diagnosis not present

## 2021-06-20 DIAGNOSIS — D72829 Elevated white blood cell count, unspecified: Secondary | ICD-10-CM | POA: Insufficient documentation

## 2021-06-20 DIAGNOSIS — Z85820 Personal history of malignant melanoma of skin: Secondary | ICD-10-CM | POA: Diagnosis not present

## 2021-06-20 DIAGNOSIS — Z86010 Personal history of colonic polyps: Secondary | ICD-10-CM | POA: Diagnosis not present

## 2021-06-20 DIAGNOSIS — Z800 Family history of malignant neoplasm of digestive organs: Secondary | ICD-10-CM | POA: Diagnosis not present

## 2021-06-20 DIAGNOSIS — F17290 Nicotine dependence, other tobacco product, uncomplicated: Secondary | ICD-10-CM | POA: Diagnosis present

## 2021-06-20 DIAGNOSIS — T426X5A Adverse effect of other antiepileptic and sedative-hypnotic drugs, initial encounter: Secondary | ICD-10-CM | POA: Diagnosis present

## 2021-06-20 DIAGNOSIS — Z9049 Acquired absence of other specified parts of digestive tract: Secondary | ICD-10-CM | POA: Diagnosis not present

## 2021-06-20 DIAGNOSIS — R739 Hyperglycemia, unspecified: Secondary | ICD-10-CM | POA: Diagnosis not present

## 2021-06-20 DIAGNOSIS — G14 Postpolio syndrome: Secondary | ICD-10-CM | POA: Diagnosis present

## 2021-06-20 DIAGNOSIS — Y92129 Unspecified place in nursing home as the place of occurrence of the external cause: Secondary | ICD-10-CM | POA: Diagnosis not present

## 2021-06-20 DIAGNOSIS — G9389 Other specified disorders of brain: Secondary | ICD-10-CM | POA: Diagnosis not present

## 2021-06-20 DIAGNOSIS — Z66 Do not resuscitate: Secondary | ICD-10-CM | POA: Diagnosis present

## 2021-06-20 DIAGNOSIS — Z9079 Acquired absence of other genital organ(s): Secondary | ICD-10-CM | POA: Diagnosis not present

## 2021-06-20 DIAGNOSIS — J9811 Atelectasis: Secondary | ICD-10-CM | POA: Diagnosis present

## 2021-06-20 DIAGNOSIS — I10 Essential (primary) hypertension: Secondary | ICD-10-CM | POA: Diagnosis not present

## 2021-06-20 DIAGNOSIS — R410 Disorientation, unspecified: Secondary | ICD-10-CM | POA: Diagnosis not present

## 2021-06-20 DIAGNOSIS — M25712 Osteophyte, left shoulder: Secondary | ICD-10-CM | POA: Diagnosis not present

## 2021-06-20 DIAGNOSIS — Z880 Allergy status to penicillin: Secondary | ICD-10-CM | POA: Diagnosis not present

## 2021-06-20 DIAGNOSIS — R441 Visual hallucinations: Secondary | ICD-10-CM | POA: Diagnosis present

## 2021-06-20 DIAGNOSIS — R000 Tachycardia, unspecified: Secondary | ICD-10-CM | POA: Diagnosis not present

## 2021-06-20 DIAGNOSIS — Z9842 Cataract extraction status, left eye: Secondary | ICD-10-CM | POA: Diagnosis not present

## 2021-06-20 DIAGNOSIS — S0990XA Unspecified injury of head, initial encounter: Secondary | ICD-10-CM | POA: Diagnosis not present

## 2021-06-20 LAB — URINALYSIS, ROUTINE W REFLEX MICROSCOPIC
Bilirubin Urine: NEGATIVE
Glucose, UA: >1000 mg/dL — AB
Ketones, ur: NEGATIVE mg/dL
Nitrite: NEGATIVE
Specific Gravity, Urine: 1.025 (ref 1.005–1.030)
pH: 6 (ref 5.0–8.0)

## 2021-06-20 LAB — CBC WITH DIFFERENTIAL/PLATELET
Abs Immature Granulocytes: 0.06 10*3/uL (ref 0.00–0.07)
Basophils Absolute: 0.1 10*3/uL (ref 0.0–0.1)
Basophils Relative: 1 %
Eosinophils Absolute: 0.2 10*3/uL (ref 0.0–0.5)
Eosinophils Relative: 3 %
HCT: 42.2 % (ref 39.0–52.0)
Hemoglobin: 14.2 g/dL (ref 13.0–17.0)
Immature Granulocytes: 1 %
Lymphocytes Relative: 19 %
Lymphs Abs: 1.6 10*3/uL (ref 0.7–4.0)
MCH: 30.3 pg (ref 26.0–34.0)
MCHC: 33.6 g/dL (ref 30.0–36.0)
MCV: 90 fL (ref 80.0–100.0)
Monocytes Absolute: 0.6 10*3/uL (ref 0.1–1.0)
Monocytes Relative: 7 %
Neutro Abs: 6 10*3/uL (ref 1.7–7.7)
Neutrophils Relative %: 69 %
Platelets: 213 10*3/uL (ref 150–400)
RBC: 4.69 MIL/uL (ref 4.22–5.81)
RDW: 12.3 % (ref 11.5–15.5)
WBC: 8.6 10*3/uL (ref 4.0–10.5)
nRBC: 0 % (ref 0.0–0.2)

## 2021-06-20 LAB — COMPREHENSIVE METABOLIC PANEL
ALT: 13 U/L (ref 0–44)
AST: 11 U/L — ABNORMAL LOW (ref 15–41)
Albumin: 4 g/dL (ref 3.5–5.0)
Alkaline Phosphatase: 58 U/L (ref 38–126)
Anion gap: 7 (ref 5–15)
BUN: 27 mg/dL — ABNORMAL HIGH (ref 8–23)
CO2: 29 mmol/L (ref 22–32)
Calcium: 9.4 mg/dL (ref 8.9–10.3)
Chloride: 100 mmol/L (ref 98–111)
Creatinine, Ser: 1.08 mg/dL (ref 0.61–1.24)
GFR, Estimated: >60 mL/min (ref >60)
Glucose, Bld: 244 mg/dL — ABNORMAL HIGH (ref 70–99)
Potassium: 4.4 mmol/L (ref 3.5–5.1)
Sodium: 136 mmol/L (ref 135–145)
Total Bilirubin: 0.6 mg/dL (ref 0.3–1.2)
Total Protein: 6.8 g/dL (ref 6.5–8.1)

## 2021-06-20 LAB — ETHANOL: Alcohol, Ethyl (B): <10 mg/dL (ref <10)

## 2021-06-20 LAB — I-STAT VENOUS BLOOD GAS, ED
Acid-Base Excess: 2 mmol/L (ref 0.0–2.0)
Bicarbonate: 29.3 mmol/L — ABNORMAL HIGH (ref 20.0–28.0)
Calcium, Ion: 1.28 mmol/L (ref 1.15–1.40)
HCT: 41 % (ref 39.0–52.0)
Hemoglobin: 13.9 g/dL (ref 13.0–17.0)
O2 Saturation: 35 %
Patient temperature: 37
Potassium: 4.3 mmol/L (ref 3.5–5.1)
Sodium: 136 mmol/L (ref 135–145)
TCO2: 31 mmol/L (ref 22–32)
pCO2, Ven: 54.2 mmHg (ref 44–60)
pH, Ven: 7.342 (ref 7.25–7.43)
pO2, Ven: 23 mmHg — CL (ref 32–45)

## 2021-06-20 LAB — CBG MONITORING, ED: Glucose-Capillary: 283 mg/dL — ABNORMAL HIGH (ref 70–99)

## 2021-06-20 LAB — AMMONIA: Ammonia: 19 umol/L (ref 9–35)

## 2021-06-20 MED ORDER — CEPHALEXIN 500 MG PO CAPS: 500.0000 mg | ORAL_CAPSULE | Freq: Two times a day (BID) | ORAL | 0 refills | Status: DC

## 2021-06-20 NOTE — ED Notes (Signed)
Pt is alert and oriented and follows conversations and commands well. However, pt stated that saw a about 12  dogs running in the room.  ?

## 2021-06-20 NOTE — ED Provider Notes (Addendum)
?[facility] ?Provider Note ? ? ?CSN: [id number] ?Arrival date & time: 06/20/21  1422 ? ?  ? ?History ? ?Chief Complaint  ?Patient presents with  ? Hallucinations  ? ? ?Eddie Sanders is a 88 y.o. male. ? ?Patient with history of hyperlipidemia and GERD presents today with complaints of hallucinations.  Patient presents from [facility], he states that around 1030 this morning he began seeing 'englishmen dressed like they're from the 1800s' walking around the facility.  He states he also saw them outside his window and states that he heard them talking and making fun of him and his wife. He states that he has never had hallucinations in the past, presented today for evaluation of same. States that he has continued to experience hallucinations since they began, states that he is still seeing and hearing 'english people' during my evaluation. He denies any other symptoms including fevers, chills, headaches, dizziness, lightheadedness, chest pain, shortness of breath, nausea, vomiting, diarrhea.  Wife who is present in the room states he has been acting normally.  He denies any new medication changes. ? ?The history is provided by the patient. No language interpreter was used.  ? ?  ? ?Home Medications ?Prior to Admission medications   ?Medication Sig Start Date End Date Taking? Authorizing Provider  ?ALPRAZolam (XANAX) 1 MG tablet To be used for sleep lab test- as sleep inducing medication in -lab. Take one half tablet by mouth at bedtime and repeat prn if sleep onset is delayed by 1 hour or more. 01/27/21   Dohmeier, Asencion Partridge, MD  ?Calcium Carb-Cholecalciferol (CALCIUM 600 + D PO) Take 1 tablet by mouth daily.    [provider]  ?cholecalciferol (VITAMIN D) 1000 UNITS tablet Take 1,000 Units by mouth daily.    [provider]  ?Coenzyme Q10 200 MG capsule Take 200 mg by mouth daily.    [provider]  ?cyanocobalamin 500 MCG tablet Take 500 mcg by  mouth daily.    [provider]  ?Multiple Vitamins-Minerals (CENTRUM SILVER ADULT 50+ PO) Take 1 tablet by mouth daily.    [provider]  ?pneumococcal 20-valent conjugate vaccine (PREVNAR 20) 0.5 ML injection Inject into the muscle. 02/05/21     ?simvastatin (ZOCOR) 20 MG tablet Take 20 mg by mouth 3 (three) times a week.    [provider]  ?   ? ?Allergies    ?Penicillins   ? ?Review of Systems   ?Review of Systems  ?Constitutional:  Negative for chills and fever.  ?Respiratory:  Negative for shortness of breath.   ?Cardiovascular:  Negative for chest pain.  ?Gastrointestinal:  Negative for abdominal pain.  ?Genitourinary:  Negative for dysuria.  ?Skin:  Negative for rash.  ?Neurological:  Negative for dizziness, tremors, seizures, syncope, facial asymmetry, speech difficulty, weakness, light-headedness, numbness and headaches.  ?Psychiatric/Behavioral:  Positive for hallucinations. Negative for agitation, behavioral problems, confusion, decreased concentration, dysphoric mood, self-injury, sleep disturbance and suicidal ideas. The patient is not nervous/anxious and is not hyperactive.   ?All other systems reviewed and are negative. ? ?Physical Exam ?Updated Vital Signs ?BP (!) 171/92 (BP Location: Right Arm)   Pulse 70   Temp 98 ?F (36.7 ?C) (Oral)   Resp 20   Ht '5\' 11"'$  (1.803 m)   Wt 85.3 kg   SpO2 97%   BMI 26.23 kg/m?  ?Physical Exam ?Vitals and nursing note reviewed.  ?Constitutional:   ?   General: He is not in acute  distress. ?   Appearance: Normal appearance. He is normal weight. He is not ill-appearing, toxic-appearing or diaphoretic.  ?HENT:  ?   Head: Normocephalic and atraumatic.  ?Eyes:  ?   Extraocular Movements: Extraocular movements intact.  ?   Pupils: Pupils are equal, round, and reactive to light.  ?Cardiovascular:  ?   Rate and Rhythm: Normal rate and regular rhythm.  ?   Heart sounds: Normal heart sounds.  ?Pulmonary:  ?   Effort: Pulmonary effort is  normal. No respiratory distress.  ?   Breath sounds: Normal breath sounds.  ?Abdominal:  ?   General: Abdomen is flat.  ?   Palpations: Abdomen is soft.  ?Musculoskeletal:     ?   General: Normal range of motion.  ?   Cervical back: Normal range of motion.  ?Skin: ?   General: Skin is warm and dry.  ?Neurological:  ?   General: No focal deficit present.  ?   Mental Status: He is alert and oriented to person, place, and time.  ?   GCS: GCS eye subscore is 4. GCS verbal subscore is 5. GCS motor subscore is 6.  ?   Sensory: Sensation is intact.  ?   Motor: Motor function is intact.  ?   Coordination: Coordination is intact.  ?   Gait: Gait is intact.  ?   Comments: Alert and oriented to self, place, time and event.  ?  ?Speech is fluent, clear without dysarthria or dysphasia.  ?  ?Strength 5/5 in upper/lower extremities   ?Sensation intact in upper/lower extremities  ?  ?CN I not tested  ?CN II grossly intact visual fields bilaterally. Did not visualize posterior eye.  ?CN III, IV, VI PERRLA and EOMs intact bilaterally  ?CN V Intact sensation to sharp and light touch to the face  ?CN VII facial movements symmetric  ?CN VIII not tested  ?CN IX, X no uvula deviation, symmetric rise of soft palate  ?CN XI 5/5 SCM and trapezius strength bilaterally  ?CN XII Midline tongue protrusion, symmetric L/R movements   ?Psychiatric:     ?   Mood and Affect: Mood normal.     ?   Behavior: Behavior normal.  ? ? ?ED Results / Procedures / Treatments   ?Labs ?(all labs ordered are listed, but only abnormal results are displayed) ?Labs Reviewed  ?COMPREHENSIVE METABOLIC PANEL - Abnormal; Notable for the following components:  ?    Result Value  ? Glucose, Bld 244 (*)   ? BUN 27 (*)   ? AST 11 (*)   ? All other components within normal limits  ?URINALYSIS, ROUTINE W REFLEX MICROSCOPIC - Abnormal; Notable for the following components:  ? Glucose, UA >1,000 (*)   ? Hgb urine dipstick SMALL (*)   ? Protein, ur TRACE (*)   ? Leukocytes,Ua  SMALL (*)   ? Bacteria, UA RARE (*)   ? All other components within normal limits  ?CBG MONITORING, ED - Abnormal; Notable for the following components:  ? Glucose-Capillary 283 (*)   ? All other components within normal limits  ?I-STAT VENOUS BLOOD GAS, ED - Abnormal; Notable for the following components:  ? pO2, Ven 23 (*)   ? Bicarbonate 29.3 (*)   ? All other components within normal limits  ?CBC WITH DIFFERENTIAL/PLATELET  ?AMMONIA  ?ETHANOL  ?BLOOD GAS, VENOUS  ? ? ?EKG ?EKG Interpretation ? ?Date/Time:  Friday Jun 20 2021 14:30:27 EDT ?Ventricular Rate:  74 ?PR Interval:  189 ?QRS Duration: 102 ?QT Interval:  387 ?QTC Calculation: 430 ?R Axis:   -72 ?Text Interpretation: Sinus rhythm Atrial premature complexes Left anterior fascicular block Abnormal R-wave progression, early transition No significant change since last tracing Confirmed by Dorie Rank [phone]) on 06/20/2021 4:45:33 PM ? ?Radiology ?CT Head Wo Contrast ? ?Result Date: 06/20/2021 ?CLINICAL DATA:  Psychosis. EXAM: CT HEAD WITHOUT CONTRAST TECHNIQUE: Contiguous axial images were obtained from the base of the skull through the vertex without intravenous contrast. RADIATION DOSE REDUCTION: This exam was performed according to the departmental dose-optimization program which includes automated exposure control, adjustment of the mA and/or kV according to patient size and/or use of iterative reconstruction technique. COMPARISON:  None Available. FINDINGS: Brain: Normal cerebral/cerebellar volume for age. Mild for age low density in the periventricular white matter likely related to small vessel disease. No mass lesion, hemorrhage, hydrocephalus, acute infarct, intra-axial, or extra-axial fluid collection. Vascular: No hyperdense vessel or unexpected calcification. Skull: No skull fracture. Sinuses/Orbits: Normal imaged portions of the orbits and globes. Clear paranasal sinuses and mastoid air cells. Cerumen in the left external ear canal. Other: None.  IMPRESSION: No acute intracranial abnormality. Relatively mild for age cerebral/cerebellar atrophy and periventricular white matter small vessel ischemic change. Electronically Signed   By: Abigail Miyamoto M.D.   On: 05/05

## 2021-06-20 NOTE — ED Triage Notes (Signed)
Patient BIB [facility] from [facility]. ? ?Per [facility], the Patient endorses being at Home with Wife when he stated he saw a Threasa Beards (Who was not physically there) and a few "Englishmen" Outside. ? ?No Pain. No SOB. Patient has no Complaints and acknowledges seeing these Objects/People at 0930 today. ? ?NAD Noted during Triage. A&Ox4 Currently. BIB Stretcher. GCS 15 Currently. ?

## 2021-06-20 NOTE — Discharge Instructions (Addendum)
As we discussed, your work-up in the ER today was reassuring for acute abnormalities. Your urine did show some signs of infection, I have therefore called you in a prescription for antibiotics for you to take as prescribed. I recommend calling your neurologist as well as your primary care doctor to schedule an appointment for further evaluation and management of your symptoms. ? ?Return if development of any new or worsening symptoms. ?

## 2021-06-20 NOTE — ED Notes (Signed)
Facility contacted and notified of electronic prescription after patient transferred back to facility.  ?

## 2021-06-20 NOTE — ED Notes (Signed)
Patient transported to CT 

## 2021-06-23 ENCOUNTER — Inpatient Hospital Stay (HOSPITAL_COMMUNITY): Payer: Medicare PPO

## 2021-06-23 ENCOUNTER — Emergency Department (HOSPITAL_COMMUNITY): Payer: Medicare PPO

## 2021-06-23 ENCOUNTER — Encounter (HOSPITAL_COMMUNITY): Payer: Self-pay | Admitting: Emergency Medicine

## 2021-06-23 ENCOUNTER — Other Ambulatory Visit: Payer: Self-pay

## 2021-06-23 ENCOUNTER — Inpatient Hospital Stay (HOSPITAL_COMMUNITY)
Admission: EM | Admit: 2021-06-23 | Discharge: 2021-06-27 | DRG: 092 | Disposition: A | Discharge status: Skilled Nursing Facility | Payer: Medicare PPO | Source: Skilled Nursing Facility | Attending: Internal Medicine | Admitting: Internal Medicine

## 2021-06-23 DIAGNOSIS — G4731 Primary central sleep apnea: Secondary | ICD-10-CM

## 2021-06-23 DIAGNOSIS — G9341 Metabolic encephalopathy: Secondary | ICD-10-CM | POA: Insufficient documentation

## 2021-06-23 DIAGNOSIS — I444 Left anterior fascicular block: Secondary | ICD-10-CM | POA: Diagnosis present

## 2021-06-23 DIAGNOSIS — E1165 Type 2 diabetes mellitus with hyperglycemia: Secondary | ICD-10-CM | POA: Diagnosis present

## 2021-06-23 DIAGNOSIS — T426X5A Adverse effect of other antiepileptic and sedative-hypnotic drugs, initial encounter: Secondary | ICD-10-CM | POA: Diagnosis present

## 2021-06-23 DIAGNOSIS — W06XXXA Fall from bed, initial encounter: Secondary | ICD-10-CM | POA: Diagnosis not present

## 2021-06-23 DIAGNOSIS — Z9841 Cataract extraction status, right eye: Secondary | ICD-10-CM

## 2021-06-23 DIAGNOSIS — Z79899 Other long term (current) drug therapy: Secondary | ICD-10-CM | POA: Diagnosis not present

## 2021-06-23 DIAGNOSIS — Z8249 Family history of ischemic heart disease and other diseases of the circulatory system: Secondary | ICD-10-CM

## 2021-06-23 DIAGNOSIS — Z9049 Acquired absence of other specified parts of digestive tract: Secondary | ICD-10-CM | POA: Diagnosis not present

## 2021-06-23 DIAGNOSIS — G4733 Obstructive sleep apnea (adult) (pediatric): Secondary | ICD-10-CM | POA: Diagnosis present

## 2021-06-23 DIAGNOSIS — Z86010 Personal history of colonic polyps: Secondary | ICD-10-CM

## 2021-06-23 DIAGNOSIS — F05 Delirium due to known physiological condition: Secondary | ICD-10-CM | POA: Diagnosis not present

## 2021-06-23 DIAGNOSIS — G9340 Encephalopathy, unspecified: Secondary | ICD-10-CM | POA: Diagnosis present

## 2021-06-23 DIAGNOSIS — Y92129 Unspecified place in nursing home as the place of occurrence of the external cause: Secondary | ICD-10-CM

## 2021-06-23 DIAGNOSIS — R739 Hyperglycemia, unspecified: Secondary | ICD-10-CM | POA: Diagnosis not present

## 2021-06-23 DIAGNOSIS — Z9079 Acquired absence of other genital organ(s): Secondary | ICD-10-CM

## 2021-06-23 DIAGNOSIS — Z85820 Personal history of malignant melanoma of skin: Secondary | ICD-10-CM | POA: Diagnosis not present

## 2021-06-23 DIAGNOSIS — G928 Other toxic encephalopathy: Secondary | ICD-10-CM | POA: Diagnosis present

## 2021-06-23 DIAGNOSIS — Z880 Allergy status to penicillin: Secondary | ICD-10-CM

## 2021-06-23 DIAGNOSIS — Z8546 Personal history of malignant neoplasm of prostate: Secondary | ICD-10-CM

## 2021-06-23 DIAGNOSIS — E785 Hyperlipidemia, unspecified: Secondary | ICD-10-CM | POA: Diagnosis present

## 2021-06-23 DIAGNOSIS — J189 Pneumonia, unspecified organism: Secondary | ICD-10-CM

## 2021-06-23 DIAGNOSIS — J9811 Atelectasis: Secondary | ICD-10-CM | POA: Diagnosis present

## 2021-06-23 DIAGNOSIS — N3001 Acute cystitis with hematuria: Secondary | ICD-10-CM | POA: Diagnosis present

## 2021-06-23 DIAGNOSIS — R441 Visual hallucinations: Secondary | ICD-10-CM | POA: Diagnosis present

## 2021-06-23 DIAGNOSIS — F17290 Nicotine dependence, other tobacco product, uncomplicated: Secondary | ICD-10-CM | POA: Diagnosis present

## 2021-06-23 DIAGNOSIS — Z66 Do not resuscitate: Secondary | ICD-10-CM | POA: Diagnosis present

## 2021-06-23 DIAGNOSIS — Z800 Family history of malignant neoplasm of digestive organs: Secondary | ICD-10-CM | POA: Diagnosis not present

## 2021-06-23 DIAGNOSIS — E1169 Type 2 diabetes mellitus with other specified complication: Secondary | ICD-10-CM | POA: Diagnosis present

## 2021-06-23 DIAGNOSIS — K219 Gastro-esophageal reflux disease without esophagitis: Secondary | ICD-10-CM | POA: Diagnosis present

## 2021-06-23 DIAGNOSIS — G14 Postpolio syndrome: Secondary | ICD-10-CM | POA: Diagnosis present

## 2021-06-23 DIAGNOSIS — Z9842 Cataract extraction status, left eye: Secondary | ICD-10-CM | POA: Diagnosis not present

## 2021-06-23 DIAGNOSIS — R410 Disorientation, unspecified: Secondary | ICD-10-CM | POA: Diagnosis present

## 2021-06-23 LAB — CBC WITH DIFFERENTIAL/PLATELET
Abs Immature Granulocytes: 0.07 10*3/uL (ref 0.00–0.07)
Basophils Absolute: 0.1 10*3/uL (ref 0.0–0.1)
Basophils Relative: 1 %
Eosinophils Absolute: 0.2 10*3/uL (ref 0.0–0.5)
Eosinophils Relative: 2 %
HCT: 40.1 % (ref 39.0–52.0)
Hemoglobin: 14 g/dL (ref 13.0–17.0)
Immature Granulocytes: 1 %
Lymphocytes Relative: 16 %
Lymphs Abs: 1.7 10*3/uL (ref 0.7–4.0)
MCH: 31.5 pg (ref 26.0–34.0)
MCHC: 34.9 g/dL (ref 30.0–36.0)
MCV: 90.1 fL (ref 80.0–100.0)
Monocytes Absolute: 0.7 10*3/uL (ref 0.1–1.0)
Monocytes Relative: 6 %
Neutro Abs: 8.4 10*3/uL — ABNORMAL HIGH (ref 1.7–7.7)
Neutrophils Relative %: 74 %
Platelets: 213 10*3/uL (ref 150–400)
RBC: 4.45 MIL/uL (ref 4.22–5.81)
RDW: 11.9 % (ref 11.5–15.5)
WBC: 11.1 10*3/uL — ABNORMAL HIGH (ref 4.0–10.5)
nRBC: 0 % (ref 0.0–0.2)

## 2021-06-23 LAB — PROCALCITONIN: Procalcitonin: <0.1 ng/mL

## 2021-06-23 LAB — TROPONIN I (HIGH SENSITIVITY)
Troponin I (High Sensitivity): 10 ng/L (ref <18)
Troponin I (High Sensitivity): 11 ng/L (ref <18)

## 2021-06-23 LAB — URINALYSIS, ROUTINE W REFLEX MICROSCOPIC
Bacteria, UA: NONE SEEN
Bilirubin Urine: NEGATIVE
Glucose, UA: 50 mg/dL — AB
Hgb urine dipstick: NEGATIVE
Ketones, ur: NEGATIVE mg/dL
Leukocytes,Ua: NEGATIVE
Nitrite: NEGATIVE
Protein, ur: NEGATIVE mg/dL
Specific Gravity, Urine: 1.016 (ref 1.005–1.030)
pH: 6 (ref 5.0–8.0)

## 2021-06-23 LAB — GLUCOSE, CAPILLARY
Glucose-Capillary: 148 mg/dL — ABNORMAL HIGH (ref 70–99)
Glucose-Capillary: 149 mg/dL — ABNORMAL HIGH (ref 70–99)

## 2021-06-23 LAB — COMPREHENSIVE METABOLIC PANEL
ALT: 15 U/L (ref 0–44)
AST: 15 U/L (ref 15–41)
Albumin: 3.4 g/dL — ABNORMAL LOW (ref 3.5–5.0)
Alkaline Phosphatase: 54 U/L (ref 38–126)
Anion gap: 12 (ref 5–15)
BUN: 20 mg/dL (ref 8–23)
CO2: 19 mmol/L — ABNORMAL LOW (ref 22–32)
Calcium: 9.2 mg/dL (ref 8.9–10.3)
Chloride: 104 mmol/L (ref 98–111)
Creatinine, Ser: 0.92 mg/dL (ref 0.61–1.24)
GFR, Estimated: >60 mL/min (ref >60)
Glucose, Bld: 224 mg/dL — ABNORMAL HIGH (ref 70–99)
Potassium: 3.9 mmol/L (ref 3.5–5.1)
Sodium: 135 mmol/L (ref 135–145)
Total Bilirubin: 1.1 mg/dL (ref 0.3–1.2)
Total Protein: 6.2 g/dL — ABNORMAL LOW (ref 6.5–8.1)

## 2021-06-23 LAB — I-STAT ARTERIAL BLOOD GAS, ED
Acid-Base Excess: 1 mmol/L (ref 0.0–2.0)
Bicarbonate: 25 mmol/L (ref 20.0–28.0)
Calcium, Ion: 1.31 mmol/L (ref 1.15–1.40)
HCT: 40 % (ref 39.0–52.0)
Hemoglobin: 13.6 g/dL (ref 13.0–17.0)
O2 Saturation: 95 %
Potassium: 3.9 mmol/L (ref 3.5–5.1)
Sodium: 136 mmol/L (ref 135–145)
TCO2: 26 mmol/L (ref 22–32)
pCO2 arterial: 38.2 mmHg (ref 32–48)
pH, Arterial: 7.425 (ref 7.35–7.45)
pO2, Arterial: 74 mmHg — ABNORMAL LOW (ref 83–108)

## 2021-06-23 LAB — AMMONIA: Ammonia: 20 umol/L (ref 9–35)

## 2021-06-23 LAB — RPR: RPR Ser Ql: NONREACTIVE

## 2021-06-23 LAB — CBG MONITORING, ED
Glucose-Capillary: 180 mg/dL — ABNORMAL HIGH (ref 70–99)
Glucose-Capillary: 142 mg/dL — ABNORMAL HIGH (ref 70–99)

## 2021-06-23 LAB — TSH: TSH: 1.555 u[IU]/mL (ref 0.350–4.500)

## 2021-06-23 LAB — APTT: aPTT: 22 seconds — ABNORMAL LOW (ref 24–36)

## 2021-06-23 LAB — PROTIME-INR
INR: 1 (ref 0.8–1.2)
Prothrombin Time: 12.8 seconds (ref 11.4–15.2)

## 2021-06-23 LAB — LACTIC ACID, PLASMA: Lactic Acid, Venous: 1.6 mmol/L (ref 0.5–1.9)

## 2021-06-23 LAB — VITAMIN B12: Vitamin B-12: 584 pg/mL (ref 180–914)

## 2021-06-23 MED ORDER — ACETAMINOPHEN 325 MG PO TABS
650.0000 mg | ORAL_TABLET | Freq: Four times a day (QID) | ORAL | Status: DC | PRN
Start: 2021-06-23 — End: 2021-06-27

## 2021-06-23 MED ORDER — INSULIN ASPART 100 UNIT/ML IJ SOLN
0.0000 [IU] | Freq: Three times a day (TID) | INTRAMUSCULAR | Status: DC
  Administered 2021-06-23 – 2021-06-25 (×7): 1 [IU] via SUBCUTANEOUS

## 2021-06-23 MED ORDER — LACTATED RINGERS IV SOLN: INTRAVENOUS | Status: DC

## 2021-06-23 MED ORDER — HALOPERIDOL LACTATE 5 MG/ML IJ SOLN
1.0000 mg | INTRAMUSCULAR | Status: DC | PRN
  Administered 2021-06-23 – 2021-06-24 (×2): 1 mg via INTRAMUSCULAR
  Filled 2021-06-23 (×3): qty 1

## 2021-06-23 MED ORDER — ACETAMINOPHEN 650 MG RE SUPP: 650.0000 mg | Freq: Four times a day (QID) | RECTAL | Status: DC | PRN

## 2021-06-23 MED ORDER — POLYETHYLENE GLYCOL 3350 17 G PO PACK: 17.0000 g | PACK | Freq: Every day | ORAL | Status: DC | PRN

## 2021-06-23 MED ORDER — SODIUM CHLORIDE 0.9 % IV SOLN
1.0000 mg | Freq: Once | INTRAVENOUS | Status: AC
  Administered 2021-06-23: 1 mg via INTRAVENOUS
  Filled 2021-06-23: qty 0.2

## 2021-06-23 MED ORDER — LORAZEPAM 2 MG/ML IJ SOLN
1.0000 mg | Freq: Once | INTRAMUSCULAR | Status: AC
  Administered 2021-06-23: 1 mg via INTRAVENOUS
  Filled 2021-06-23: qty 1

## 2021-06-23 MED ORDER — SODIUM CHLORIDE 0.9 % IV SOLN
500.0000 mg | Freq: Once | INTRAVENOUS | Status: AC
  Administered 2021-06-23: 500 mg via INTRAVENOUS
  Filled 2021-06-23: qty 5

## 2021-06-23 MED ORDER — SODIUM CHLORIDE 0.9 % IV SOLN
1.0000 g | Freq: Once | INTRAVENOUS | Status: AC
Start: 2021-06-23 — End: 2021-06-23
  Administered 2021-06-23: 1 g via INTRAVENOUS
  Filled 2021-06-23: qty 10

## 2021-06-23 MED ORDER — ENOXAPARIN SODIUM 40 MG/0.4ML IJ SOSY
40.0000 mg | PREFILLED_SYRINGE | Freq: Every day | INTRAMUSCULAR | Status: DC
  Administered 2021-06-23 – 2021-06-27 (×4): 40 mg via SUBCUTANEOUS
  Filled 2021-06-23 (×5): qty 0.4

## 2021-06-23 MED ORDER — THIAMINE HCL 100 MG/ML IJ SOLN
500.0000 mg | Freq: Two times a day (BID) | INTRAVENOUS | Status: DC
  Administered 2021-06-23 – 2021-06-24 (×3): 500 mg via INTRAVENOUS
  Filled 2021-06-23 (×4): qty 5

## 2021-06-23 NOTE — ED Triage Notes (Addendum)
Pt bib [facility] from [facility] for fall (non-ambulatory, but seen standing self up and falling onto floor), tachycardia, HR between 90-130bpm, and hallucinations/altered mental status. Pt arrives very confused, oriented to self and year, disoriented to situation and place.  ? ?Vitals: ?CBG 256 ?180/90 ?93% room air ?

## 2021-06-23 NOTE — Progress Notes (Addendum)
Responded to bed alarm to find patient sitting in the floor on the right side of bed.  Patient confused, agitated, and physically aggressive.  Patient kicked at staff and would not allow to clean the stool off of his body and place into bed.  It took 5 staff members to fully pick up patient and place into bed.  Patient c/o right knee pain.  Gave patient complete bath and bed change with the assistance of 4 staff members as patient is confused and very agitated.  Patient continues to punch and kick at staff during bath.  Orders received for IM Haldol and 4 point restraints.  Patient continues to scream "HELP" at the top of his lungs.  MD aware. ?2350: Notifed daughter, Charolett Bumpers. ? ?

## 2021-06-23 NOTE — ED Notes (Signed)
Pt found confused in bed, removed cords and gown from self, and urinary incontinent in disposable brief from facility. Pt changed into clean gown.  ?

## 2021-06-23 NOTE — ED Provider Notes (Signed)
? ?[facility]  ?Provider Note ? ?CSN: [id number] ?Arrival date & time: 06/23/21 0239 ? ?History ?Chief Complaint  ?Patient presents with  ? Altered Mental Status  ? ? ?Eddie Sanders is a 88 y.o. male brought to the ED via EMS from [facility]. EMS reports he has had AMS and hallucinations for the last few days. They state staff at [facility] reported he had fallen but that it sounded like he became agitated and threw himself on the ground before getting up and getting back into bed. No reported injuries. He was seen at [facility] a few days ago for hallucinations and thought to have a UTI. Discharged with Rx for Keflex but symptoms have not improved. Patient is confused and answers questions tangentially but for the most part appropriately. He does not remember falling. Only complaining of L arm pain.  ? ? ?Home Medications ?Prior to Admission medications   ?Medication Sig Start Date End Date Taking? Authorizing Provider  ?ALPRAZolam (XANAX) 1 MG tablet To be used for sleep lab test- as sleep inducing medication in -lab. Take one half tablet by mouth at bedtime and repeat prn if sleep onset is delayed by 1 hour or more. 01/27/21   Dohmeier, Asencion Partridge, MD  ?Calcium Carb-Cholecalciferol (CALCIUM 600 + D PO) Take 1 tablet by mouth daily.    [provider]  ?cephALEXin (KEFLEX) 500 MG capsule Take 1 capsule (500 mg total) by mouth 2 (two) times daily for 7 days. 06/20/21 06/27/21  Smoot, Leary Roca, PA-C  ?cholecalciferol (VITAMIN D) 1000 UNITS tablet Take 1,000 Units by mouth daily.    [provider]  ?Coenzyme Q10 200 MG capsule Take 200 mg by mouth daily.    [provider]  ?cyanocobalamin 500 MCG tablet Take 500 mcg by mouth daily.    [provider]  ?Multiple Vitamins-Minerals (CENTRUM SILVER ADULT 50+ PO) Take 1 tablet by mouth daily.    [provider]  ?pneumococcal 20-valent conjugate vaccine (PREVNAR 20) 0.5 ML injection Inject into the muscle.  02/05/21     ?simvastatin (ZOCOR) 20 MG tablet Take 20 mg by mouth 3 (three) times a week.    [provider]  ? ? ? ?Allergies    ?Penicillins ? ? ?Review of Systems   ?Review of Systems ?Please see HPI for pertinent positives and negatives ? ?Physical Exam ?BP (!) 158/94   Pulse 99   Temp 98.1 ?F (36.7 ?C) (Oral)   Resp 19   SpO2 97%  ? ?Physical Exam ?Vitals and nursing note reviewed.  ?Constitutional:   ?   Appearance: Normal appearance.  ?HENT:  ?   Head: Normocephalic and atraumatic.  ?   Nose: Nose normal.  ?   Mouth/Throat:  ?   Mouth: Mucous membranes are moist.  ?Eyes:  ?   Extraocular Movements: Extraocular movements intact.  ?   Conjunctiva/sclera: Conjunctivae normal.  ?Cardiovascular:  ?   Rate and Rhythm: Tachycardia present.  ?Pulmonary:  ?   Effort: Pulmonary effort is normal.  ?   Breath sounds: Normal breath sounds.  ?Abdominal:  ?   General: Abdomen is flat.  ?   Palpations: Abdomen is soft.  ?   Tenderness: There is no abdominal tenderness. There is no guarding.  ?Musculoskeletal:     ?   General: No swelling. Normal range of motion.  ?   Cervical back: Neck supple.  ?Skin: ?   General: Skin is warm and dry.  ?Neurological:  ?  General: No focal deficit present.  ?   Mental Status: He is alert. He is disoriented.  ?   Cranial Nerves: No cranial nerve deficit.  ?   Sensory: No sensory deficit.  ?   Motor: No weakness.  ?Psychiatric:     ?   Mood and Affect: Mood normal.  ? ? ?ED Results / Procedures / Treatments   ?EKG ?EKG Interpretation ? ?Date/Time:  Monday Jun 23 2021 02:46:25 EDT ?Ventricular Rate:  123 ?PR Interval:  121 ?QRS Duration: 104 ?QT Interval:  325 ?QTC Calculation: 465 ?R Axis:   -64 ?Text Interpretation: Sinus or ectopic atrial tachycardia Atrial premature complex Incomplete RBBB and LAFB Abnormal R-wave progression, early transition Since last tracing Rate faster Confirmed by Calvert Cantor [phone]) on 06/23/2021 3:12:14 AM ? ?Procedures ?Procedures ? ?Medications  Ordered in the ED ?Medications  ?cefTRIAXone (ROCEPHIN) 1 g in sodium chloride 0.9 % 100 mL IVPB (0 g Intravenous Stopped 06/23/21 0353)  ?azithromycin (ZITHROMAX) 500 mg in sodium chloride 0.9 % 250 mL IVPB (0 mg Intravenous Stopped 06/23/21 0500)  ? ? ?Initial Impression and Plan ? Patient with continued AMS/hallucinations despite oral Abx for the last few days. Will check labs, rescan head due to concerns of a fall (no outward signs of injury).  ? ?ED Course  ? ?Clinical Course as of 06/23/21 0519  ?Mon Jun 23, 2021  ?0313 CBC with WBC of 11 up from recent ED visit.  [CS]  ?0332 I personally viewed the images from radiology studies and agree with radiologist interpretation: CT is neg for acute injury.  ? [CS]  ?8413 Coags normal.  [CS]  ?0337 ABG without hypercapnia to explain his hallucinations. Lactic acid is normal.  [CS]  ?100 I personally viewed the images from radiology studies and agree with radiologist interpretation: CXR with consolidation vs atelectasis. Shoulder neg for fracture. Will begin Rocephin/Zithromax to cover CAP and presumed UTI.  ? [CS]  ?0411 CMP and Trop are unremarkable.  [CS]  ?2440 UA improved from recent ED visit sample. Given his worsening mental status, will admit for further management.  [CS]  ?[place] with Dr. Myna Hidalgo, Hospitalist, who will evaluate for admission.  [CS]  ?  ?Clinical Course User Index ?[CS] Truddie Hidden, MD  ? ? ? ?MDM Rules/Calculators/A&P ?Medical Decision Making ?Problems Addressed: ?Community acquired pneumonia of right lower lobe of lung: acute illness or injury ?Metabolic encephalopathy: acute illness or injury ? ?Amount and/or Complexity of Data Reviewed ?Labs: ordered. Decision-making details documented in ED Course. ?Radiology: ordered and independent interpretation performed. Decision-making details documented in ED Course. ?ECG/medicine tests: ordered and independent interpretation performed. Decision-making details documented in ED  Course. ? ?Risk ?Decision regarding hospitalization. ? ? ? ?Final Clinical Impression(s) / ED Diagnoses ?Final diagnoses:  ?Metabolic encephalopathy  ?Community acquired pneumonia of right lower lobe of lung  ? ? ?Rx / DC Orders ?ED Discharge Orders   ? ? None  ? ?  ? ?  ?Truddie Hidden, MD ?06/23/21 [phone] ? ?

## 2021-06-23 NOTE — Assessment & Plan Note (Addendum)
Patient with acute metabolic encephalopathy complicated with delirium. ? ?He received 1 mg Lorazepam IV in the ED on admission.  ?Last night continue to be agitated and fell from the bed.  ? ?This am is more awake and alert, he is able to tell the date and time, no lack of attention or disorganized thinking. Oriented to time and person but not place.  ? ?Plan to continue supportive medical therapy. ?Decrease IV fluids to 75 ml per hr ?Change thiamine to po and advance diet to regular. ?Add oral multivitamins  ?Consult nutrition for evaluation ?Follow up on EEG. ?Continue with as needed haloperidol and avoid benzodiazepine.  ?Neuro checks per unit protocol, fall precautions and close one to one monitoring for safety.  ? ?Reactive leukocytosis with wbc at 13.4, no clinical sings of bacterial infection, will check cell count in am, for now will hold on antibiotic therapy.  ?

## 2021-06-23 NOTE — ED Notes (Signed)
Hospitalist at bedside 

## 2021-06-23 NOTE — Procedures (Signed)
Patient Name: Eddie Sanders  ?MRN: [id number]  ?Epilepsy Attending: Lora Havens  ?Referring Physician/Provider: Tawni Millers, MD ?Date: 06/23/2021 ?Duration: 21.10 mins ? ?Patient history: 88yo M with ams. EEG to evaluate for seizure. ? ?Level of alertness: Awake, asleep ? ?AEDs during EEG study: None ? ?Technical aspects: This EEG study was done with scalp electrodes positioned according to the 10-20 International system of electrode placement. Electrical activity was acquired at a sampling rate of '500Hz'$  and reviewed with a high frequency filter of '70Hz'$  and a low frequency filter of '1Hz'$ . EEG data were recorded continuously and digitally stored.  ? ?Description: The posterior dominant rhythm consists of  8-9 Hz activity of moderate voltage (25-35 uV) seen predominantly in posterior head regions, symmetric and reactive to eye opening and eye closing. Sleep was characterized by vertex waves, sleep spindles (12 to 14 Hz), maximal frontocentral region.  EEG showed intermittent generalized 3 to 6 Hz theta-delta slowing. Hyperventilation and photic stimulation were not performed.    ? ?Of note, study was technically difficult due to significant myogenic artifact.  ? ?ABNORMALITY ?- Intermittent slow, generalized ? ?IMPRESSION: ?This technically difficult study is suggestive of mild diffuse encephalopathy, nonspecific etiology. No seizures or epileptiform discharges were seen throughout the recording. ? ?Lora Havens  ? ?

## 2021-06-23 NOTE — Assessment & Plan Note (Addendum)
Patient post poli ambulatory dysfunction, uses a scooter at home.  ?PT and OT  ?

## 2021-06-23 NOTE — ED Notes (Signed)
Patient continues to be altered not making sense, doesn't open his eyes unless forced to.  Patient very restless and fidgeting.  Mittens in place to prevent from pulling wires, IVs.  NAD noted.  ?

## 2021-06-23 NOTE — Progress Notes (Addendum)
?Progress Note ? ? ?Patient: Eddie Sanders DOB: Oct 23, 1928 DOA: 06/23/2021     0 ?DOS: the patient was seen and examined on 06/23/2021 ?  ?Brief hospital course: ?Eddie Sanders was admitted to the hospital with the working diagnosis of acute encephalopathy.  ? ?88 yo male with the past medical history of colonic mass sp colectomy, prostate cancer sp prostatectomy, post polio syndrome with left leg weakness who presented with hallucinations and confusion. Recent ED evaluation on 06/20/21 for confusion, he was diagnosed with urine infection and discharged home on antibiotic therapy. At home he continue to have worsening confusion and agitation, EMS was called and patient was brought back to the hospital.  ? ?Per his wife, patient was noted to be irritable on 05/03, the next day on 05/04 he started noticing visual hallucinations, that became more frequent and intense. No urinary symptoms. 05/05 diagnosed with UTI and placed on antibiotic therapy. 05/06 continue to deteriorate and was transferred from assisted living to higher level of care, where he became very agitated and combative, not able to communicate anymore, persistent symptoms on 05/07, prompting his transfer to the ED.  ? ?On his initial physical examination he was not able to give any meaningful history. Blood pressure 174/91, HR 98, RR 19 and 02 saturation 97%. Lungs with no wheezing or rales, heart with S1 and S2 present and rhythmic, abdomen not distended and no lower extremity edema. Patient swinging arms to examiner when awake.  ? ?Na 135, K 3,9 Cl 104, bicarbonate 19, glucose 224 bun 20 cr 0,92  ?Wbc 11.1 hgb 14, plt 213  ? ?Head CT with no acute changes.  ? ?Urine analysis SG 1,016 with 0-5 wbc 0-5 rbc  ?Alcohol <10  ? ?Chest radiograph with bibasilar atelectasis.  ? ?EKG 123 bpm, normal axis, normal intervals, sinus rhythm with no significant ST segment or T wave changes.  ? ?Assessment and Plan: ?* Acute encephalopathy ?Patient with  acute metabolic encephalopathy complicated with delirium. ? ?Patient had worsening hallucinations at home that have triggered severe delirium. He is not able to communicate or follow commands.  ?Has received 1 mg Lorazepam IV in the ED.  ?His family is at the bedside.  ? ?Plan for supportive medical care, with IV fluids.  ?Neuro checks, fall and aspiration precautions. ?Further work up with EEG.  ?Add IV multivitamins and thiamine.  ? Add haloperidol as needed for agitation, will avoid benzodiazepines.  ?Patient is DNR per patient's family at the bedside  ? ?Hyperglycemia ?Capillary glucose 142, patient not tolerating po due to encephalopathy ? ?Continue capillary glucose monitoring, insulin sliding scale, hold on basal insulin for now.  ? ?Complex sleep apnea syndrome ?Patient on Cpap at home, because change in mental status, will hold on c pap for now and continue oxymetry monitoring.  ?As needed supplemental 02 per [place]  ? ?Post-polio syndrome ?Patient post poli ambulatory dysfunction, uses a scooter at home.  ? ? ? ? ?  ? ?Subjective: Patient is not responsive, sedated, not able to follow commands, but protecting his airway.  ? ?Physical Exam: ?Vitals:  ? 06/23/21 1100 06/23/21 1115 06/23/21 1145 06/23/21 1230  ?BP: (!) 141/83 129/73 (!) 153/81 (!) 155/98  ?Pulse: 75 67 73 78  ?Resp: '20 19 18 18  '$ ?Temp:      ?TempSrc:      ?SpO2: 95% 90% 95% 91%  ? ?Neurology lethargic and not responsive, incomprehensive speech and not following commands. Combative,  ?ENT with mild pallor ?Cardiovascular  with S1 and S2 present and rhythmic with no gallops or rubs ?Respiratory with no rales or wheezing ?Abdomen not distended ?No lower extremity edema ?No rashes  ?Data Reviewed: ? ? ? ?Family Communication: I spoke with patient's wife and daughter at the bedside, we talked in detail about patient's condition, plan of care and prognosis and all questions were addressed. ? ? ?Disposition: ?Status is: Observation ?The patient will  require care spanning > 2 midnights and should be moved to inpatient because: acute encephalopathy and delirium  ? Planned Discharge Destination:  to be determined  ? ? ? ? ?Author: ?Tawni Millers, MD ?06/23/2021 12:36 PM ? ?For on call review [email or web address].  ?

## 2021-06-23 NOTE — Assessment & Plan Note (Addendum)
Continue supplemental 02 per [place] and close oxymetry monitoring.  ?When patient more stable from encephalopathy, will resume Cpap.  ?

## 2021-06-23 NOTE — ED Notes (Signed)
ED provider confirmed that pt is not on blood thinning medications.  ?

## 2021-06-23 NOTE — Progress Notes (Signed)
?   06/23/21 2223  ?Provider Notification  ?Provider Name/Title Dr Cyd Silence  ?Date Provider Notified 06/23/21  ?Time Provider Notified 2224  ?Method of Notification Page  ?Notification Reason Other (Comment) ?(pt just fell out of bed, very confused and combative)  ?Provider response See new orders  ?Date of Provider Response 06/23/21  ?Time of Provider Response 2230  ? ? ?

## 2021-06-23 NOTE — Progress Notes (Signed)
EEG complete - results pending 

## 2021-06-23 NOTE — Assessment & Plan Note (Addendum)
Fating glucose is 171. ?Hgb is 8,5 ? ?Continue with insulin sliding scale for glucose cover and monitoring ?Patient may need basal insulin.  ?

## 2021-06-23 NOTE — ED Notes (Signed)
In & out catheter performed by this RN and Deshaunte, NT. Afterwards, pt began to immediately remove monitor equipment and cords from self and attempt to remove gown for a second time. Monitoring cords placed back on pt, gown re-buttoned, and mittens placed on pt (confirmed that mits were not placed tightly) to prevent pt from intervening his continuous monitoring and care.  ?

## 2021-06-23 NOTE — H&P (Signed)
?History and Physical  ? ? ?Eddie Sanders JSE:[id number] DOB: [birth year] DOA: 06/23/2021 ? ?PCP: Crist Infante, MD  ? ?Patient coming from: [place]  ? ?Chief Complaint: Hallucinations, confusion  ? ?HPI: Eddie Sanders is a pleasant 88 y.o. male with medical history significant for OSA on BiPAP, postpolio syndrome with left leg weakness, prostate cancer status post radical prostatectomy, and colonic mass status post sigmoid colectomy, now presenting to the emergency department from his retirement community where he has been experiencing hallucinations and worsening confusion.  Patient was seen in an emergency department on 06/20/2021 for evaluation of visual hallucinations, had no acute head CT findings, negative alcohol level, normal ammonia, and basic blood work notable only for hyperglycemia.  He was started on Keflex at that time for a possible UTI but has continued to experience visual hallucinations and become progressively confused.  He was reported to be agitated at his retirement community where he threw himself to the ground, prompting EMS to be called.  He is unable to provide any meaningful history in the ED. ? ?ED Course: Upon arrival to the ED, patient is found to be afebrile and saturating well on room air with stable blood pressure.  Head CT negative for acute intracranial abnormality.  Chest x-ray with bibasilar atelectasis or consolidation.  Plain radiographs negative for shoulder fracture.  Blood work notable for glucose 224 and bicarbonate 19.  Blood and urine cultures were collected in the ED and the patient was treated with Rocephin, azithromycin, and Ativan. ? ?Review of Systems:  ?Unable to complete ROS secondary to the patient's clinical condition. ? ?Past Medical History:  ?Diagnosis Date  ? Adenomatous colon polyp   ? Allergic rhinitis   ? Arthritis   ? GERD (gastroesophageal reflux disease)   ? Hyperlipidemia   ? Polio 1939  ? Post-polio syndrome   ? Prostate  cancer ([place])   ? Skin cancer (melanoma) ([place])   ? Right eye  ? ? ?Past Surgical History:  ?Procedure Laterality Date  ? cataract surgery    ? bilateral  ? LUMBAR EPIDURAL INJECTION    ? LUMBAR LAMINECTOMY/DECOMPRESSION MICRODISCECTOMY Right 07/19/2013  ? Procedure: LUMBAR LAMINECTOMY/DECOMPRESSION MICRODISCECTOMY 2 LEVELS     lumbar  three/four,  four/five;  Surgeon: Eustace Moore, MD;  Location: [facility];  Service: Neurosurgery;  Laterality: Right;  ? PROSTATECTOMY    ? sigmoid colectomy    ? TONSILLECTOMY    ? ? ?Social History:  ? reports that he has been smoking cigars. He has never used smokeless tobacco. He reports current alcohol use. He reports that he does not use drugs. ? ?Allergies  ?Allergen Reactions  ? Penicillins   ?  REACTION: hives, rash, swelling  ? ? ?Family History  ?Problem Relation Age of Onset  ? Pancreatic cancer Father   ? Cancer Father   ? Heart failure Mother   ? Heart disease Mother   ? Colon cancer Neg Hx   ? ? ? ?Prior to Admission medications   ?Medication Sig Start Date End Date Taking? Authorizing Provider  ?ALPRAZolam (XANAX) 1 MG tablet To be used for sleep lab test- as sleep inducing medication in -lab. Take one half tablet by mouth at bedtime and repeat prn if sleep onset is delayed by 1 hour or more. 01/27/21   Dohmeier, Asencion Partridge, MD  ?Calcium Carb-Cholecalciferol (CALCIUM 600 + D PO) Take 1 tablet by mouth daily.    [provider]  ?cephALEXin (KEFLEX) 500 MG capsule  Take 1 capsule (500 mg total) by mouth 2 (two) times daily for 7 days. 06/20/21 06/27/21  Smoot, Leary Roca, PA-C  ?cholecalciferol (VITAMIN D) 1000 UNITS tablet Take 1,000 Units by mouth daily.    [provider]  ?Coenzyme Q10 200 MG capsule Take 200 mg by mouth daily.    [provider]  ?cyanocobalamin 500 MCG tablet Take 500 mcg by mouth daily.    [provider]  ?Multiple Vitamins-Minerals (CENTRUM SILVER ADULT 50+ PO) Take 1 tablet by mouth daily.    [provider]   ?pneumococcal 20-valent conjugate vaccine (PREVNAR 20) 0.5 ML injection Inject into the muscle. 02/05/21     ?simvastatin (ZOCOR) 20 MG tablet Take 20 mg by mouth 3 (three) times a week.    [provider]  ? ? ?Physical Exam: ?Vitals:  ? 06/23/21 0315 06/23/21 0330 06/23/21 0418 06/23/21 0430  ?BP: (!) 174/91 (!) 180/90 (!) 162/94 (!) 158/94  ?Pulse: 98 100 (!) 105 99  ?Resp: '19 14 12 19  '$ ?Temp:      ?TempSrc:      ?SpO2: 98% 97% 96% 97%  ? ? ?Constitutional: NAD, calm  ?Eyes: PERTLA, lids and conjunctivae normal ?ENMT: Mucous membranes are moist. Posterior pharynx clear of any exudate or lesions.   ?Neck: supple, no masses  ?Respiratory: no wheezing, no crackles. No accessory muscle use.  ?Cardiovascular: S1 & S2 heard, regular rate and rhythm. No extremity edema.  ?Abdomen: No distension, no tenderness, soft. Bowel sounds active.  ?Musculoskeletal: no clubbing / cyanosis. No joint deformity upper and lower extremities.   ?Skin: no significant rashes, lesions, ulcers. Warm, dry, well-perfused. ?Neurologic: CN 2-12 grossly intact. Moving all extremities. Sleeping, wakes to voice and immediately starts swing arms at examiner.   ? ? ?Labs and Imaging on Admission: I have personally reviewed following labs and imaging studies ? ?CBC: ?Recent Labs  ?Lab 06/20/21 ?1603 06/20/21 ?1908 06/23/21 ?0255 06/23/21 ?0326  ?WBC 8.6  --  11.1*  --   ?NEUTROABS 6.0  --  8.4*  --   ?HGB 14.2 13.9 14.0 13.6  ?HCT 42.2 41.0 40.1 40.0  ?MCV 90.0  --  90.1  --   ?PLT 213  --  213  --   ? ?Basic Metabolic Panel: ?Recent Labs  ?Lab 06/20/21 ?1603 06/20/21 ?1908 06/23/21 ?0255 06/23/21 ?0326  ?NA 136 136 135 136  ?K 4.4 4.3 3.9 3.9  ?CL 100  --  104  --   ?CO2 29  --  19*  --   ?GLUCOSE 244*  --  224*  --   ?BUN 27*  --  20  --   ?CREATININE 1.08  --  0.92  --   ?CALCIUM 9.4  --  9.2  --   ? ?GFR: ?Estimated Creatinine Clearance: 55.7 mL/min (by C-G formula based on SCr of 0.92 mg/dL). ?Liver Function Tests: ?Recent Labs  ?Lab  06/20/21 ?1603 06/23/21 ?0255  ?AST 11* 15  ?ALT 13 15  ?ALKPHOS 58 54  ?BILITOT 0.6 1.1  ?PROT 6.8 6.2*  ?ALBUMIN 4.0 3.4*  ? ?No results for input(s): LIPASE, AMYLASE in the last 168 hours. ?Recent Labs  ?Lab 06/20/21 ?1710  ?AMMONIA 19  ? ?Coagulation Profile: ?Recent Labs  ?Lab 06/23/21 ?0255  ?INR 1.0  ? ?Cardiac Enzymes: ?No results for input(s): CKTOTAL, CKMB, CKMBINDEX, TROPONINI in the last 168 hours. ?BNP (last 3 results) ?No results for input(s): PROBNP in the last 8760 hours. ?HbA1C: ?No results for input(s): HGBA1C  in the last 72 hours. ?CBG: ?Recent Labs  ?Lab 06/20/21 ?1426  ?GLUCAP 283*  ? ?Lipid Profile: ?No results for input(s): CHOL, HDL, LDLCALC, TRIG, CHOLHDL, LDLDIRECT in the last 72 hours. ?Thyroid Function Tests: ?No results for input(s): TSH, T4TOTAL, FREET4, T3FREE, THYROIDAB in the last 72 hours. ?Anemia Panel: ?No results for input(s): VITAMINB12, FOLATE, FERRITIN, TIBC, IRON, RETICCTPCT in the last 72 hours. ?Urine analysis: ?   ?Component Value Date/Time  ? COLORURINE YELLOW 06/23/2021 0420  ? APPEARANCEUR CLEAR 06/23/2021 0420  ? LABSPEC 1.016 06/23/2021 0420  ? PHURINE 6.0 06/23/2021 0420  ? GLUCOSEU 50 (A) 06/23/2021 0420  ? [place] NEGATIVE 06/23/2021 0420  ? [place] NEGATIVE 06/23/2021 0420  ? [place] NEGATIVE 06/23/2021 0420  ? PROTEINUR NEGATIVE 06/23/2021 0420  ? NITRITE NEGATIVE 06/23/2021 0420  ? LEUKOCYTESUR NEGATIVE 06/23/2021 0420  ? ?Sepsis Labs: ?'@LABRCNTIP'$ (procalcitonin:4,lacticidven:4) ?)No results found for this or any previous visit (from the past 240 hour(s)).  ? ?Radiological Exams on Admission: ?CT Head Wo Contrast ? ?Result Date: 06/23/2021 ?CLINICAL DATA:  Fall.  Head trauma, minor (Age >= 65y) Fall EXAM: CT HEAD WITHOUT CONTRAST TECHNIQUE: Contiguous axial images were obtained from the base of the skull through the vertex without intravenous contrast. RADIATION DOSE REDUCTION: This exam was performed according to the departmental dose-optimization program  which includes automated exposure control, adjustment of the mA and/or kV according to patient size and/or use of iterative reconstruction technique. COMPARISON:  06/20/2021 FINDINGS: Brain: No acute intracrania

## 2021-06-23 NOTE — Hospital Course (Addendum)
Eddie Sanders was admitted to the hospital with the working diagnosis of acute encephalopathy.  ? ?88 yo male with the past medical history of colonic mass sp colectomy, prostate cancer sp prostatectomy, post polio syndrome with left leg weakness who presented with hallucinations and confusion. Recent ED evaluation on 06/20/21 for confusion, he was diagnosed with urine infection and discharged home on antibiotic therapy. At home he continue to have worsening confusion and agitation, EMS was called and patient was brought back to the hospital.  ? ?Per his wife, patient was noted to be irritable on 05/03, the next day on 05/04 he started noticing visual hallucinations, that became more frequent and intense. No urinary symptoms. 05/05 diagnosed with UTI and placed on antibiotic therapy. 05/06 continue to deteriorate and was transferred from assisted living to higher level of care, where he became very agitated and combative, not able to communicate anymore, persistent symptoms on 05/07, prompting his transfer to the ED.  ? ?On his initial physical examination he was not able to give any meaningful history. Blood pressure 174/91, HR 98, RR 19 and 02 saturation 97%. Lungs with no wheezing or rales, heart with S1 and S2 present and rhythmic, abdomen not distended and no lower extremity edema. Patient swinging arms to examiner when awake.  ? ?Na 135, K 3,9 Cl 104, bicarbonate 19, glucose 224 bun 20 cr 0,92  ?Wbc 11.1 hgb 14, plt 213  ? ?Head CT with no acute changes.  ? ?Urine analysis SG 1,016 with 0-5 wbc 0-5 rbc  ?Alcohol <10  ? ?Chest radiograph with bibasilar atelectasis.  ? ?EKG 123 bpm, normal axis, normal intervals, sinus ? rhythm with no significant ST segment or T wave changes.  ? ?Patient has been placed on supportive medical therapy including IV fluids and IV thiamine. As needed haloperidol for agitation.  ?05/08 fall out of the bed, patient found sitting on the floor.  ? ?05/09 with improvement in his mentation,  advance diet but continue on restrains.  ?

## 2021-06-23 NOTE — ED Notes (Signed)
Patient soiled, changed brief and linens.  Family at bedside wanting to speak to family.MD notified ?

## 2021-06-24 DIAGNOSIS — G4731 Primary central sleep apnea: Secondary | ICD-10-CM | POA: Diagnosis not present

## 2021-06-24 DIAGNOSIS — G14 Postpolio syndrome: Secondary | ICD-10-CM | POA: Diagnosis not present

## 2021-06-24 DIAGNOSIS — E1165 Type 2 diabetes mellitus with hyperglycemia: Secondary | ICD-10-CM

## 2021-06-24 DIAGNOSIS — G9340 Encephalopathy, unspecified: Secondary | ICD-10-CM | POA: Diagnosis not present

## 2021-06-24 LAB — BASIC METABOLIC PANEL
Anion gap: 12 (ref 5–15)
BUN: 17 mg/dL (ref 8–23)
CO2: 19 mmol/L — ABNORMAL LOW (ref 22–32)
Calcium: 8.9 mg/dL (ref 8.9–10.3)
Chloride: 105 mmol/L (ref 98–111)
Creatinine, Ser: 0.94 mg/dL (ref 0.61–1.24)
GFR, Estimated: >60 mL/min (ref >60)
Glucose, Bld: 171 mg/dL — ABNORMAL HIGH (ref 70–99)
Potassium: 4 mmol/L (ref 3.5–5.1)
Sodium: 136 mmol/L (ref 135–145)

## 2021-06-24 LAB — GLUCOSE, CAPILLARY
Glucose-Capillary: 147 mg/dL — ABNORMAL HIGH (ref 70–99)
Glucose-Capillary: 153 mg/dL — ABNORMAL HIGH (ref 70–99)
Glucose-Capillary: 168 mg/dL — ABNORMAL HIGH (ref 70–99)
Glucose-Capillary: 239 mg/dL — ABNORMAL HIGH (ref 70–99)
Glucose-Capillary: 190 mg/dL — ABNORMAL HIGH (ref 70–99)
Glucose-Capillary: 195 mg/dL — ABNORMAL HIGH (ref 70–99)

## 2021-06-24 LAB — CBC
HCT: 40.3 % (ref 39.0–52.0)
Hemoglobin: 13.8 g/dL (ref 13.0–17.0)
MCH: 30.5 pg (ref 26.0–34.0)
MCHC: 34.2 g/dL (ref 30.0–36.0)
MCV: 89.2 fL (ref 80.0–100.0)
Platelets: 196 10*3/uL (ref 150–400)
RBC: 4.52 MIL/uL (ref 4.22–5.81)
RDW: 11.9 % (ref 11.5–15.5)
WBC: 13.4 10*3/uL — ABNORMAL HIGH (ref 4.0–10.5)
nRBC: 0 % (ref 0.0–0.2)

## 2021-06-24 LAB — URINE CULTURE: Culture: <10000 — AB

## 2021-06-24 LAB — HEMOGLOBIN A1C
Hgb A1c MFr Bld: 8.5 % — ABNORMAL HIGH (ref 4.8–5.6)
Mean Plasma Glucose: 197 mg/dL

## 2021-06-24 MED ORDER — TAB-A-VITE/IRON PO TABS
1.0000 | ORAL_TABLET | Freq: Every day | ORAL | Status: DC
Start: 2021-06-24 — End: 2021-06-26
  Administered 2021-06-24 – 2021-06-26 (×3): 1 via ORAL
  Filled 2021-06-24 (×3): qty 1

## 2021-06-24 MED ORDER — THIAMINE HCL 100 MG PO TABS
500.0000 mg | ORAL_TABLET | Freq: Two times a day (BID) | ORAL | Status: DC
  Administered 2021-06-24 – 2021-06-26 (×4): 500 mg via ORAL
  Filled 2021-06-24 (×5): qty 5

## 2021-06-24 NOTE — Progress Notes (Addendum)
?Progress Note ? ? ?Patient: Eddie Sanders FTD:[id number] DOB: May 13, 1928 DOA: 06/23/2021     1 ?DOS: the patient was seen and examined on 06/24/2021 ?  ?Brief hospital course: ?[name] was admitted to the hospital with the working diagnosis of acute encephalopathy.  ? ?88 yo male with the past medical history of colonic mass sp colectomy, prostate cancer sp prostatectomy, post polio syndrome with left leg weakness who presented with hallucinations and confusion. Recent ED evaluation on 06/20/21 for confusion, he was diagnosed with urine infection and discharged home on antibiotic therapy. At home he continue to have worsening confusion and agitation, EMS was called and patient was brought back to the hospital.  ? ?Per his wife, patient was noted to be irritable on 05/03, the next day on 05/04 he started noticing visual hallucinations, that became more frequent and intense. No urinary symptoms. 05/05 diagnosed with UTI and placed on antibiotic therapy. 05/06 continue to deteriorate and was transferred from assisted living to higher level of care, where he became very agitated and combative, not able to communicate anymore, persistent symptoms on 05/07, prompting his transfer to the ED.  ? ?On his initial physical examination he was not able to give any meaningful history. Blood pressure 174/91, HR 98, RR 19 and 02 saturation 97%. Lungs with no wheezing or rales, heart with S1 and S2 present and rhythmic, abdomen not distended and no lower extremity edema. Patient swinging arms to examiner when awake.  ? ?Na 135, K 3,9 Cl 104, bicarbonate 19, glucose 224 bun 20 cr 0,92  ?Wbc 11.1 hgb 14, plt 213  ? ?Head CT with no acute changes.  ? ?Urine analysis SG 1,016 with 0-5 wbc 0-5 rbc  ?Alcohol <10  ? ?Chest radiograph with bibasilar atelectasis.  ? ?EKG 123 bpm, normal axis, normal intervals, sinus ? rhythm with no significant ST segment or T wave changes.  ? ?Patient has been placed on supportive medical therapy  including IV fluids and IV thiamine. As needed haloperidol for agitation.  ?05/08 fall out of the bed, patient found sitting on the floor.  ? ?05/09 with improvement in his mentation, advance diet but continue on restrains.  ? ?Assessment and Plan: ?* Acute encephalopathy ?Patient with acute metabolic encephalopathy complicated with delirium. ? ?He received 1 mg Lorazepam IV in the ED on admission.  ?Last night continue to be agitated and fell from the bed.  ? ?This am is more awake and alert, he is able to tell the date and time, no lack of attention or disorganized thinking. Oriented to time and person but not place.  ? ?Plan to continue supportive medical therapy. ?Decrease IV fluids to 75 ml per hr ?Change thiamine to po and advance diet to regular. ?Add oral multivitamins  ?Consult nutrition for evaluation ?Follow up on EEG. ?Continue with as needed haloperidol and avoid benzodiazepine.  ?Neuro checks per unit protocol, fall precautions and close one to one monitoring for safety.  ? ?Reactive leukocytosis with wbc at 13.4, no clinical sings of bacterial infection, will check cell count in am, for now will hold on antibiotic therapy.  ? ?Uncontrolled type 2 diabetes mellitus with hyperglycemia ([place]) ?Fating glucose is 171. ?Hgb is 8,5 ? ?Continue with insulin sliding scale for glucose cover and monitoring ?Patient may need basal insulin.  ? ?Complex sleep apnea syndrome ?Continue supplemental 02 per [place] and close oxymetry monitoring.  ?When patient more stable from encephalopathy, will resume Cpap.  ? ?Post-polio syndrome ?Patient post poli  ambulatory dysfunction, uses a scooter at home.  ?PT and OT  ? ? ? ? ?  ? ?Subjective: Patient is feeling better, he is more awake and alert, no chest pain or dyspnea  ? ?Physical Exam: ?Vitals:  ? 06/24/21 0456 06/24/21 0552 06/24/21 0738 06/24/21 1120  ?BP: 137/72 (!) 156/80 (!) 156/77 (!) 141/78  ?Pulse: (!) 117 91 (!) 106 (!) 105  ?Resp: '20 20 18 18  '$ ?Temp: 97.8 ?F (36.6  ?C) 98.2 ?F (36.8 ?C) 98.9 ?F (37.2 ?C) 98.7 ?F (37.1 ?C)  ?TempSrc: Oral Oral Oral   ?SpO2: 100% 96% 98% 96%  ? ?Neurology awake and alert, orientated times person and time but not place, patient non focal upper extremities, follows commands, no lack of attention or disorganized thinking ?ENT with no pallor ?Cardiovascular with S1 and S2 present and rhythmic with no gallops, rubs or murmurs ?Respiratory with no rales or wheezing ?Abdomen not distended  ?No lower extremity edema.  ?Data Reviewed: ? ? ? ?Family Communication: I spoke with patient's wife at the bedside, we talked in detail about patient's condition, plan of care and prognosis and all questions were addressed. ? ? ?Disposition: ?Status is: Inpatient ?Remains inpatient appropriate because: encephalopathy  ? Planned Discharge Destination: Home ? ? ?Author: ?Tawni Millers, MD ?06/24/2021 11:47 AM ? ?For on call review [email or web address].  ?

## 2021-06-24 NOTE — Progress Notes (Signed)
Initial Nutrition Assessment ? ?DOCUMENTATION CODES:  ?Not applicable ? ?INTERVENTION:  ?Continue current diet as ordered, encourage PO intake ?Switch to ordering assistance ?Magic cup BID with meals, each supplement provides 290 kcal and 9 grams of protein ?MVI with minerals daily ? ?NUTRITION DIAGNOSIS:  ?Increased nutrient needs related to acute illness as evidenced by estimated needs. ? ?GOAL:  ?Patient will meet greater than or equal to 90% of their needs ? ?MONITOR:  ?PO intake, Supplement acceptance ? ?REASON FOR ASSESSMENT:  ?Consult ?Assessment of nutrition requirement/status ? ?ASSESSMENT:  ?Pt with hx of HLD, HTN, hx of colon mass s/p colectomy, hx prostate cancer s/p prostatectomy, post polio syndrome with left leg weakness, DM type 2, and GERD presented to ED from his retirement community with confusion and combativeness. ? ?Pt resting in bed at the time of assessment, working on eating lunch. Wife and daughter at bedside. Seems to be having some difficulty with getting food onto fork and requiring assistance from family.  ? ?Pt reports a good appetite today and prior to admission. States that the food at [facility] is good (except the [place] food) and that his intake is adequate. Reports stable weight aside from some recent intentional loss. On exam, muscle and fat deficits intact. Depletions noted to the BLE, but pt uses a scooter at baseline due to post-polio syndrome.  ? ?Pt does not drink ensure or boost supplements at baseline but does like ice cream. Will add magic cup BID to encourage adequate intake. ? ?Average Meal Intake: ?5/9: 5% intake x 1 recorded meals ? ?Nutritionally Relevant Medications: ?Scheduled Meds: ? insulin aspart  0-6 Units Subcutaneous TID WC  ? multivitamins with iron  1 tablet Oral Daily  ? thiamine  500 mg Oral BID  ? ?Continuous Infusions: ? lactated ringers 75 mL/hr at 06/24/21 1158  ? ?PRN Meds: polyethylene glycol ? ?Labs Reviewed: ?CBG ranges from 142-180 mg/dL over  the last 24 hours ?HgbA1c 8.5% ? ?NUTRITION - FOCUSED PHYSICAL EXAM: ?Flowsheet Row Most Recent Value  ?Orbital Region No depletion  ?Upper Arm Region No depletion  ?Thoracic and Lumbar Region No depletion  ?Buccal Region No depletion  ?Temple Region No depletion  ?Clavicle Bone Region No depletion  ?Clavicle and Acromion Bone Region No depletion  ?Scapular Bone Region No depletion  ?Dorsal Hand No depletion  ?Patellar Region Mild depletion  ?Anterior Thigh Region Mild depletion  ?Posterior Calf Region Moderate depletion  ?Edema (RD Assessment) None  ?Hair Reviewed  ?Eyes Reviewed  ?Mouth Reviewed  ?Skin Reviewed  ?Nails Reviewed  ? ?Diet Order:   ?Diet Order   ? ?       ?  Diet Carb Modified Fluid consistency: Thin; Room service appropriate? Yes  Diet effective now       ?  ? ?  ?  ? ?  ? ? ?EDUCATION NEEDS:  ?No education needs have been identified at this time ? ?Skin:  Skin Assessment: Reviewed RN Assessment ? ?Last BM:  5/8 ? ?Height:  ?Ht Readings from Last 1 Encounters:  ?06/20/21 '5\' 11"'$  (1.803 m)  ? ?Weight:  ?Wt Readings from Last 1 Encounters:  ?06/20/21 85.3 kg  ? ?Ideal Body Weight:  78.2 kg ? ?BMI:  There is no height or weight on file to calculate BMI. ? ?Estimated Nutritional Needs:  ?Kcal:  1900-2100 kcal/d ?Protein:  85-100g/d ?Fluid:  2-2.2 L/d ? ? ? ?[name], RD, LDN ?Clinical Dietitian ?RD pager # available in [place]  ?After hours/weekend pager #  available in [place] ?

## 2021-06-24 NOTE — Evaluation (Signed)
Occupational Therapy Evaluation ?Patient Details ?Name: Eddie Sanders ?MRN: [id number] ?DOB: [birth year] ?Today's Date: 06/24/2021 ? ? ?History of Present Illness Eddie Sanders is a pleasant 88 y.o. male presenting to the emergency department from his retirement community where he has been experiencing hallucinations and worsening confusion. Found to have acute encephalopathy. For last 2 months he has exclusively used a scooter for mobility doing lateral scoots to get on and off of it. PHMx:OSA on BiPAP, postpolio syndrome with left leg weakness, prostate cancer status post radical prostatectomy, and colonic mass status post sigmoid colectomy,  ? ?Clinical Impression ?  ?This 88 yo male admitted with above presents to acute OT with PLOF of being Mod I-min A for all basic ADLs from a scooter level (wife had to A with pants up and down). Currently is still confused and having hallucinations with A level overall Mod A. He will continue to benefit from acute OT with follow up at SNF. ?   ? ?Recommendations for follow up therapy are one component of a multi-disciplinary discharge planning process, led by the attending physician.  Recommendations may be updated based on patient status, additional functional criteria and insurance authorization.  ? ?Follow Up Recommendations ? Skilled nursing-short term rehab (<3 hours/day)  ?  ?Assistance Recommended at Discharge Frequent or constant Supervision/Assistance  ?Patient can return home with the following A lot of help with walking and/or transfers;A lot of help with bathing/dressing/bathroom;Assistance with feeding;Assistance with cooking/housework;Assist for transportation;Direct supervision/assist for financial management;Direct supervision/assist for medications management ? ?  ?Functional Status Assessment ? Patient has had a recent decline in their functional status and demonstrates the ability to make significant improvements in function in a reasonable and predictable  amount of time.  ?Equipment Recommendations ? None recommended by OT  ?  ?   ?Precautions / Restrictions Precautions ?Precautions: Fall ?Restrictions ?Weight Bearing Restrictions: No  ? ?  ? ?Mobility Bed Mobility ?Overal bed mobility: Needs Assistance ?Bed Mobility: Supine to Sit, Sit to Supine ?  ?  ?Supine to sit: Mod assist, HOB elevated ?Sit to supine: Min assist ?  ?  ?  ? ? ? ?  ?Balance Overall balance assessment: Needs assistance ?Sitting-balance support: Single extremity supported, Feet supported ?Sitting balance-Leahy Scale: Poor ?Sitting balance - Comments: posterior LOB intermittently ?  ?  ?  ?  ?  ?  ?  ?  ?  ?  ?  ?  ?  ?  ?  ?   ? ?ADL either performed or assessed with clinical judgement  ? ?ADL Overall ADL's : Needs assistance/impaired ?Eating/Feeding: Moderate assistance;Sitting ?  ?Grooming: Moderate assistance;Sitting ?  ?Upper Body Bathing: Moderate assistance;Sitting ?  ?  ?  ?Upper Body Dressing : Moderate assistance;Sitting ?  ?  ?Lower Body Dressing Details (indicate cue type and reason): Pt can cross his legs to get to feet but has intermittent LOB ?  ?  ?  ?  ?  ?  ?  ?   ? ? ? ?Vision Baseline Vision/History: 1 Wears glasses ?Ability to See in Adequate Light: 0 Adequate ?Patient Visual Report: Blurring of vision ?Vision Assessment?: Yes ?Eye Alignment: Within Functional Limits ?Ocular Range of Motion: Within Functional Limits ?Alignment/Gaze Preference: Within Defined Limits ?Tracking/Visual Pursuits: Able to track stimulus in all quads without difficulty ?Depth Perception: Overshoots;Undershoots  ?   ?   ?   ? ?Pertinent Vitals/Pain Pain Assessment ?Pain Assessment: No/denies pain  ? ? ? ?Hand Dominance Right ?  ?Extremity/Trunk  Assessment Upper Extremity Assessment ?Upper Extremity Assessment: Generalized weakness ?  ?  ?  ?  ?  ?Communication Communication ?Communication: Expressive difficulties (hard to understand at times) ?  ?Cognition Arousal/Alertness:  (awake initially then got  sleepy when I layed him down) ?Behavior During Therapy: Restless ?Overall Cognitive Status: Impaired/Different from baseline ?Area of Impairment: Orientation, Following commands, Safety/judgement, Awareness, Problem solving ?  ?  ?  ?  ?  ?  ?  ?  ?Orientation Level: Disoriented to, Place, Situation ?  ?  ?Following Commands: Follows one step commands inconsistently ?Safety/Judgement: Decreased awareness of safety, Decreased awareness of deficits ?Awareness: Intellectual ?Problem Solving: Difficulty sequencing, Requires verbal cues, Requires tactile cues ?  ?  ?  ?   ?   ?   ? ? ?Home Living Family/patient expects to be discharged to:: Private residence (independent living) ?Living Arrangements: Spouse/significant other ?Available Help at Discharge: Family;Available 24 hours/day ?Type of Home: Independent living facility ?Home Access: Level entry ?  ?  ?Home Layout: One level ?  ?  ?Bathroom Shower/Tub:  (sponge baths) ?  ?Bathroom Toilet: Handicapped height ?  ?  ?Home Equipment: Electric scooter;Grab bars - tub/shower ?  ?  ?  ? ?  ?Prior Functioning/Environment Prior Level of Function : Independent/Modified Independent ?  ?  ?  ?  ?  ?  ?Mobility Comments: Does not ambulate--only uses scooter to get around in last 2 months ?ADLs Comments: wife has to help with pulling pants up and down ?  ? ?  ?  ?OT Problem List: Decreased strength;Decreased range of motion;Decreased activity tolerance;Impaired balance (sitting and/or standing);Impaired vision/perception;Decreased cognition;Decreased coordination;Impaired UE functional use;Decreased safety awareness ?  ?   ?OT Treatment/Interventions: Self-care/ADL training;DME and/or AE instruction;Patient/family education;Balance training  ?  ?OT Goals(Current goals can be found in the care plan section) Acute Rehab OT Goals ?Patient Stated Goal: wife would like him back home with her (they will be married 37 years in Dec) ?OT Goal Formulation: With family ?Time For Goal  Achievement: 07/08/21 ?Potential to Achieve Goals: Good  ?OT Frequency: Min 2X/week ?  ? ?   ?AM-PAC OT "6 Clicks" Daily Activity     ?Outcome Measure Help from another person eating meals?: Total ?Help from another person taking care of personal grooming?: Total ?Help from another person toileting, which includes using toliet, bedpan, or urinal?: Total ?Help from another person bathing (including washing, rinsing, drying)?: Total ?Help from another person to put on and taking off regular upper body clothing?: Total ?Help from another person to put on and taking off regular lower body clothing?: Total ?6 Click Score: 6 ?  ?End of Session   ? ?Activity Tolerance: Patient tolerated treatment well;Patient limited by lethargy ?Patient left: in bed;with call bell/phone within reach;with chair alarm set ? ?OT Visit Diagnosis: Other abnormalities of gait and mobility (R26.89);Muscle weakness (generalized) (M62.81);Low vision, both eyes (H54.2);Other symptoms and signs involving cognitive function  ?              ?Time: 2025-4270 ?OT Time Calculation (min): 30 min ?Charges:  OT General Charges ?$OT Visit: 1 Visit ?OT Treatments ?$Self Care/Home Management : 8-22 mins ? ?[place], OTR/L ?Acute Rehab Services ?Pager [phone] ?Office [phone] ? ? ? ?Almon Register ?06/24/2021, 2:17 PM ?

## 2021-06-25 DIAGNOSIS — G9340 Encephalopathy, unspecified: Secondary | ICD-10-CM | POA: Diagnosis not present

## 2021-06-25 LAB — CBC
HCT: 39.7 % (ref 39.0–52.0)
Hemoglobin: 13.7 g/dL (ref 13.0–17.0)
MCH: 30.8 pg (ref 26.0–34.0)
MCHC: 34.5 g/dL (ref 30.0–36.0)
MCV: 89.2 fL (ref 80.0–100.0)
Platelets: 188 10*3/uL (ref 150–400)
RBC: 4.45 MIL/uL (ref 4.22–5.81)
RDW: 11.9 % (ref 11.5–15.5)
WBC: 9.9 10*3/uL (ref 4.0–10.5)
nRBC: 0 % (ref 0.0–0.2)

## 2021-06-25 LAB — GLUCOSE, CAPILLARY
Glucose-Capillary: 178 mg/dL — ABNORMAL HIGH (ref 70–99)
Glucose-Capillary: 186 mg/dL — ABNORMAL HIGH (ref 70–99)
Glucose-Capillary: 163 mg/dL — ABNORMAL HIGH (ref 70–99)
Glucose-Capillary: 168 mg/dL — ABNORMAL HIGH (ref 70–99)

## 2021-06-25 LAB — BASIC METABOLIC PANEL
Anion gap: 9 (ref 5–15)
BUN: 16 mg/dL (ref 8–23)
CO2: 21 mmol/L — ABNORMAL LOW (ref 22–32)
Calcium: 9.1 mg/dL (ref 8.9–10.3)
Chloride: 107 mmol/L (ref 98–111)
Creatinine, Ser: 0.94 mg/dL (ref 0.61–1.24)
GFR, Estimated: >60 mL/min (ref >60)
Glucose, Bld: 196 mg/dL — ABNORMAL HIGH (ref 70–99)
Potassium: 4.7 mmol/L (ref 3.5–5.1)
Sodium: 137 mmol/L (ref 135–145)

## 2021-06-25 LAB — VITAMIN B1: Vitamin B1 (Thiamine): 142.8 nmol/L (ref 66.5–200.0)

## 2021-06-25 MED ORDER — QUETIAPINE FUMARATE 25 MG PO TABS: 25.0000 mg | ORAL_TABLET | Freq: Every day | ORAL | Status: DC

## 2021-06-25 MED ORDER — QUETIAPINE FUMARATE 25 MG PO TABS
25.0000 mg | ORAL_TABLET | Freq: Every day | ORAL | Status: DC
  Administered 2021-06-25 – 2021-06-26 (×2): 25 mg via ORAL
  Filled 2021-06-25 (×2): qty 1

## 2021-06-25 MED ORDER — HALOPERIDOL LACTATE 5 MG/ML IJ SOLN: 2.0000 mg | INTRAMUSCULAR | Status: DC | PRN

## 2021-06-25 MED ORDER — HALOPERIDOL LACTATE 5 MG/ML IJ SOLN
1.0000 mg | INTRAMUSCULAR | Status: DC | PRN
  Administered 2021-06-25: 1 mg via INTRAVENOUS

## 2021-06-25 MED ORDER — HALOPERIDOL LACTATE 5 MG/ML IJ SOLN
2.0000 mg | Freq: Once | INTRAMUSCULAR | Status: AC
  Administered 2021-06-25: 2 mg via INTRAMUSCULAR
  Filled 2021-06-25: qty 1

## 2021-06-25 NOTE — NC FL2 (Signed)
?[place] MEDICAID FL2 LEVEL OF CARE SCREENING TOOL  ?  ? ?IDENTIFICATION  ?Patient Name: ?Eddie Sanders Birthdate: 01/24/1929 Sex: male Admission Date (Current Location): ?06/23/2021  ?[place] and [place] Number: ? [place] ?  Facility and Address:  ?[facility], [place] [address], [place], [place] [address] ?     Provider Number: ?[id number]  ?Attending Physician Name and Address:  ?Cherene Altes, MD ? Relative Name and Phone Number:  ?  ?   ?Current Level of Care: ?Hospital Recommended Level of Care: ?[place] Prior Approval Number: ?  ? ?Date Approved/Denied: ?  PASRR Number: ?  ? ?Discharge Plan: ?SNF ?  ? ?Current Diagnoses: ?Patient Active Problem List  ? Diagnosis Date Noted  ? Acute encephalopathy 06/23/2021  ? Uncontrolled type 2 diabetes mellitus with hyperglycemia ([place]) 06/23/2021  ? Metabolic encephalopathy   ? Post-polio syndrome   ? Post-poliomyelitis syndrome 03/05/2021  ? Non-seasonal allergic rhinitis 03/05/2021  ? Complex sleep apnea syndrome 10/28/2020  ? Treatment-emergent central sleep apnea 10/28/2020  ? Leg pain, diffuse, right 07/08/2020  ? Postpoliomyelitis muscular atrophy 07/08/2020  ? Excessive daytime sleepiness 07/08/2020  ? Excessive postexertional fatigue 07/08/2020  ? PLMD (periodic limb movement disorder) 07/08/2020  ? Right epiretinal membrane 01/31/2020  ? Pseudophakia, both eyes 01/31/2020  ? Posterior capsular opacification visually significant of left eye 01/31/2020  ? History of vitrectomy 01/31/2020  ? Posterior vitreous detachment of left eye 01/31/2020  ? S/P lumbar laminectomy 07/19/2013  ? ? ?Orientation RESPIRATION BLADDER Height & Weight   ?  ?Self, Place ? Normal Incontinent Weight:   ?Height:     ?BEHAVIORAL SYMPTOMS/MOOD NEUROLOGICAL BOWEL NUTRITION STATUS  ?    Incontinent Diet (carb modified)  ?AMBULATORY STATUS COMMUNICATION OF NEEDS Skin   ?Extensive Assist Verbally Normal ?  ?  ?  ?    ?     ?     ? ? ?Personal Care  Assistance Level of Assistance  ?Bathing, Feeding, Dressing Bathing Assistance: Maximum assistance ?Feeding assistance: Limited assistance ?Dressing Assistance: Maximum assistance ?   ? ?Functional Limitations Info  ?Sight, Hearing Sight Info: Impaired ?Hearing Info: Impaired ?   ? ? ?SPECIAL CARE FACTORS FREQUENCY  ?PT (By licensed PT), OT (By licensed OT)   ?  ?PT Frequency: 5x/wk ?OT Frequency: 5x/wk ?  ?  ?  ?   ? ? ?Contractures Contractures Info: Not present  ? ? ?Additional Factors Info  ?Code Status, Allergies, Insulin Sliding Scale Code Status Info: DNR ?Allergies Info: Penicillins ?  ?Insulin Sliding Scale Info: see DC summary ?  ?   ? ?Current Medications (06/25/2021):  This is the current hospital active medication list ?Current Facility-Administered Medications  ?Medication Dose Route Frequency Provider Last Rate Last Admin  ? acetaminophen (TYLENOL) tablet 650 mg  650 mg Oral Q6H PRN Opyd, Ilene Qua, MD      ? enoxaparin (LOVENOX) injection 40 mg  40 mg Subcutaneous Daily Opyd, Ilene Qua, MD   40 mg at 06/25/21 9562  ? haloperidol lactate (HALDOL) injection 1 mg  1 mg Intramuscular Q4H PRN Tawni Millers, MD   1 mg at 06/24/21 0518  ? insulin aspart (novoLOG) injection 0-6 Units  0-6 Units Subcutaneous TID WC Opyd, Ilene Qua, MD   1 Units at 06/25/21 1227  ? multivitamins with iron tablet 1 tablet  1 tablet Oral Daily Arrien, Jimmy Picket, MD   1 tablet at 06/25/21 0840  ? polyethylene glycol (MIRALAX /  GLYCOLAX) packet 17 g  17 g Oral Daily PRN Opyd, Ilene Qua, MD      ? thiamine tablet 500 mg  500 mg Oral BID Arrien, Jimmy Picket, MD   500 mg at 06/25/21 3317  ? ? ? ?Discharge Medications: ?Please see discharge summary for a list of discharge medications. ? ?Relevant Imaging Results: ? ?Relevant Lab Results: ? ? ?Additional Information ?SS#: [id number] ? ?Geralynn Ochs, LCSW ? ? ? ? ?

## 2021-06-25 NOTE — TOC Initial Note (Signed)
Transition of Care (TOC) - Initial/Assessment Note  ? ? ?Patient Details  ?Name: Eddie Sanders ?MRN: [id number] ?Date of Birth: 1928/11/10 ? ?Transition of Care [facility]) CM/SW Contact:    ?Geralynn Ochs, LCSW ?Phone Number: ?06/25/2021, 2:22 PM ? ?Clinical Narrative:                CSW spoke with patient's spouse and daughter on the phone to discuss recommendation for SNF. Family in agreement, hopeful for return to [facility] soon. Per MD, plan for tomorrow. CSW spoke with Admissions at [facility] and patient will have a room tomorrow, preference for a morning admission. CSW updated MD and will follow. ? ? ?Expected Discharge Plan: [place] ?Barriers to Discharge: Continued Medical Work up ? ? ?Patient Goals and CMS Choice ?Patient states their goals for this hospitalization and ongoing recovery are:: patient unable to participate in goal setting, not fully oriented ?CMS Medicare.gov Compare Post Acute Care list provided to:: Patient Represenative (must comment) ?Choice offered to / list presented to : Spouse, Adult Children ? ?Expected Discharge Plan and Services ?Expected Discharge Plan: [place] ?  ?  ?Post Acute Care Choice: [place] ?Living arrangements for the past 2 months: [place] ?                ?  ?  ?  ?  ?  ?  ?  ?  ?  ?  ? ?Prior Living Arrangements/Services ?Living arrangements for the past 2 months: [place] ?Lives with:: Facility Resident ?Patient language and need for interpreter reviewed:: No ?Do you feel safe going back to the place where you live?: Yes      ?Need for Family Participation in Patient Care: Yes (Comment) ?Care giver support system in place?: Yes (comment) ?  ?Criminal Activity/Legal Involvement Pertinent to Current Situation/Hospitalization: No - Comment as needed ? ?Activities of Daily Living ?  ?  ? ?Permission Sought/Granted ?Permission sought to share information with : Facility Automotive engineer, Family Supports ?Permission granted to share information with : Yes, Verbal Permission Granted ? Share Information with NAME: [name] ? Permission granted to share info w AGENCY: [facility] ? Permission granted to share info w Relationship: Spouse, Daughter ?   ? ?Emotional Assessment ?  ?Attitude/Demeanor/Rapport: Unable to Assess ?Affect (typically observed): Unable to Assess ?Orientation: : Oriented to Place, Oriented to Self ?Alcohol / Substance Use: Not Applicable ?Psych Involvement: No (comment) ? ?Admission diagnosis:  Metabolic encephalopathy [R42.70] ?Delirium [R41.0] ?Acute encephalopathy [G93.40] ?Community acquired pneumonia of right lower lobe of lung [J18.9] ?Patient Active Problem List  ? Diagnosis Date Noted  ? Acute encephalopathy 06/23/2021  ? Uncontrolled type 2 diabetes mellitus with hyperglycemia ([place]) 06/23/2021  ? Metabolic encephalopathy   ? Post-polio syndrome   ? Post-poliomyelitis syndrome 03/05/2021  ? Non-seasonal allergic rhinitis 03/05/2021  ? Complex sleep apnea syndrome 10/28/2020  ? Treatment-emergent central sleep apnea 10/28/2020  ? Leg pain, diffuse, right 07/08/2020  ? Postpoliomyelitis muscular atrophy 07/08/2020  ? Excessive daytime sleepiness 07/08/2020  ? Excessive postexertional fatigue 07/08/2020  ? PLMD (periodic limb movement disorder) 07/08/2020  ? Right epiretinal membrane 01/31/2020  ? Pseudophakia, both eyes 01/31/2020  ? Posterior capsular opacification visually significant of left eye 01/31/2020  ? History of vitrectomy 01/31/2020  ? Posterior vitreous detachment of left eye 01/31/2020  ? S/P lumbar laminectomy 07/19/2013  ? ?PCP:  Crist Infante, MD ?Pharmacy:   ?[facility] -  [place], [place] ?[place] [address] ?Phone: [phone] Fax: [phone] ? ?[id number]?Phone: [phone]:  [phone]? ? ? ? ?Social Determinants of Health (SDOH) Interventions ?  ? ?Readmission Risk Interventions ?   ? View : No data to display.  ?  ?  ?  ? ? ? ?

## 2021-06-25 NOTE — Progress Notes (Addendum)
Patient is extremely agitated and combative.  Patient attempting to get out of bed. When writer  asked patient  to stay in bed patient began kicking at and Therapist, art,  distress button  pushed and staff members arrived to help assist patient back to bed. Patient bit staff member  while assisting him back to bed. Dr. Thereasa Solo notified and new orders received. Attempted to update and notify patient wife Opal Sidles via Advertising account executive. Will continue to assess patient and update MD as needed. ?

## 2021-06-25 NOTE — Evaluation (Signed)
Physical Therapy Evaluation ?Patient Details ?Name: Eddie Sanders ?MRN: [id number] ?DOB: [birth year] ?Today's Date: 06/25/2021 ? ?History of Present Illness ? Eddie Sanders is a pleasant 88 y.o. male presenting to the emergency department from his retirement community where he has been experiencing hallucinations and worsening confusion. Found to have acute encephalopathy. For last 2 months he has exclusively used a scooter for mobility doing lateral scoots to get on and off of it. PHMx:OSA on BiPAP, postpolio syndrome with left leg weakness, prostate cancer status post radical prostatectomy, and colonic mass status post sigmoid colectomy, ?  ?Clinical Impression ? Pt admitted with above. Pt pleasant but demonstrates hallucinations and confusion. Pt re-oriented several time to place, time, and situation however pt with no carry over/recall. Pt able to initiate lateral scoot to drop arm chair this date however remains constant verbal and tactile cues to stay on task and for safety. Pt requiring increased assist and 24/7 supervision currently due to both impaired cognition and function. Pt to benefit from ST-SNF upon d/c to return to PLOF for safe transition home with wife at [facility]. Acute PT to cont to follow.   ?   ? ?Recommendations for follow up therapy are one component of a multi-disciplinary discharge planning process, led by the attending physician.  Recommendations may be updated based on patient status, additional functional criteria and insurance authorization. ? ?Follow Up Recommendations Skilled nursing-short term rehab (<3 hours/day) ? ?  ?Assistance Recommended at Discharge Frequent or constant Supervision/Assistance  ?Patient can return home with the following ? A lot of help with walking and/or transfers;A lot of help with bathing/dressing/bathroom;Direct supervision/assist for medications management;Direct supervision/assist for financial management;Assist for transportation;Help with stairs  or ramp for entrance ? ?  ?Equipment Recommendations None recommended by PT  ?Recommendations for Other Services ?    ?  ?Functional Status Assessment Patient has had a recent decline in their functional status and demonstrates the ability to make significant improvements in function in a reasonable and predictable amount of time.  ? ?  ?Precautions / Restrictions Precautions ?Precautions: Fall ?Precaution Comments: hallucinations ?Restrictions ?Weight Bearing Restrictions: No  ? ?  ? ?Mobility ? Bed Mobility ?Overal bed mobility: Needs Assistance ?Bed Mobility: Rolling, Sidelying to Sit ?Rolling: Mod assist ?Sidelying to sit: Mod assist ?  ?  ?  ?General bed mobility comments: max tactile and verbal directional cues to complete roll and then push self up into sitting, modA for trunk elevation, tactile cues to initiate LEs off EOB ?  ? ?Transfers ?Overall transfer level: Needs assistance ?Equipment used:  (bed pad under pt) ?Transfers: Bed to chair/wheelchair/BSC, Sit to/from Stand ?Sit to Stand: Mod assist, Max assist ?  ?  ?  ?  ? Lateral/Scoot Transfers: Mod assist, +2 safety/equipment ?General transfer comment: pt initiated pushing up with bilat UEs well, PT used bed pad under patient to help facilitate lateral scoot towards the L to drop arm recliner. pt attempted to stand x 3 however unable to achieve full upright standing due to impaired cognition and retropulsion ?  ? ?Ambulation/Gait ?  ?  ?  ?  ?  ?  ?  ?General Gait Details: unable ? ?Stairs ?  ?  ?  ?  ?  ? ?Wheelchair Mobility ?  ? ?Modified Rankin (Stroke Patients Only) ?  ? ?  ? ?Balance Overall balance assessment: Needs assistance ?Sitting-balance support: Single extremity supported, Feet supported ?Sitting balance-Leahy Scale: Fair ?Sitting balance - Comments: pt able to maintain balance  however once he completed LAQ pt with posterior lean ?  ?  ?  ?  ?  ?  ?  ?  ?  ?  ?  ?  ?  ?  ?  ?   ? ? ? ?Pertinent Vitals/Pain Pain Assessment ?Pain Assessment:  Faces ?Faces Pain Scale: No hurt  ? ? ?Home Living Family/patient expects to be discharged to:: Assisted living (independent living, [facility]) ?Living Arrangements: Spouse/significant other ?Available Help at Discharge: Family;Available 24 hours/day ?Type of Home: Assisted living ([facility]) ?Home Access: Level entry ?  ?  ?  ?Home Layout: One level ?Home Equipment: Electric scooter;Grab bars - tub/shower ?   ?  ?Prior Function Prior Level of Function : Needs assist;Patient poor historian/Family not available (information taken from OT eval that was performed yesterday) ?  ?  ?  ?  ?  ?  ?Mobility Comments: Does not ambulate--only uses scooter to get around in last 2 months ?ADLs Comments: wife has to help with pulling pants up and down ?  ? ? ?Hand Dominance  ? Dominant Hand: Right ? ?  ?Extremity/Trunk Assessment  ? Upper Extremity Assessment ?Upper Extremity Assessment: Generalized weakness ?  ? ?Lower Extremity Assessment ?Lower Extremity Assessment: Generalized weakness ?  ? ?Cervical / Trunk Assessment ?Cervical / Trunk Assessment: Kyphotic  ?Communication  ? Communication: Expressive difficulties (hard to understand at times, noted hallucinations)  ?Cognition Arousal/Alertness: Awake/alert ?Behavior During Therapy: Restless ?Overall Cognitive Status: Impaired/Different from baseline ?Area of Impairment: Orientation, Following commands, Safety/judgement, Awareness, Problem solving, Memory, Attention ?  ?  ?  ?  ?  ?  ?  ?  ?Orientation Level: Disoriented to, Place, Situation, Time (pt reoriented to [facility] in [place], pt with no recall t/o session) ?Current Attention Level: Focused ?Memory: Decreased short-term memory ?Following Commands: Follows one step commands with increased time ?Safety/Judgement: Decreased awareness of safety, Decreased awareness of deficits ?Awareness: Intellectual ?Problem Solving: Difficulty sequencing, Requires verbal cues, Requires tactile cues,  Slow processing, Decreased initiation ?General Comments: pt with noted hallucinations and confusion. Pt pleasant not combative ?  ?  ? ?  ?General Comments General comments (skin integrity, edema, etc.): vss, pt hallucinating ? ?  ?Exercises    ? ?Assessment/Plan  ?  ?PT Assessment Patient needs continued PT services  ?PT Problem List Decreased strength;Decreased range of motion;Decreased activity tolerance;Decreased balance;Decreased mobility;Decreased coordination;Decreased cognition;Decreased safety awareness;Decreased knowledge of precautions ? ?   ?  ?PT Treatment Interventions DME instruction;Gait training;Stair training;Functional mobility training;Therapeutic activities;Therapeutic exercise;Balance training   ? ?PT Goals (Current goals can be found in the Care Plan section)  ?Acute Rehab PT Goals ?Patient Stated Goal: didn't state ?PT Goal Formulation: Patient unable to participate in goal setting ?Time For Goal Achievement: 07/09/21 ?Potential to Achieve Goals: Good ? ?  ?Frequency Min 2X/week ?  ? ? ?Co-evaluation   ?  ?  ?  ?  ? ? ?  ?AM-PAC PT "6 Clicks" Mobility  ?Outcome Measure Help needed turning from your back to your side while in a flat bed without using bedrails?: A Lot ?Help needed moving from lying on your back to sitting on the side of a flat bed without using bedrails?: A Lot ?Help needed moving to and from a bed to a chair (including a wheelchair)?: A Lot ?Help needed standing up from a chair using your arms (e.g., wheelchair or bedside chair)?: Total ?Help needed to walk in hospital room?: Total ?Help needed climbing 3-5  steps with a railing? : Total ?6 Click Score: 9 ? ?  ?End of Session   ?Activity Tolerance: Patient tolerated treatment well ?Patient left: in chair;with call bell/phone within reach;with nursing/sitter in room ?Nurse Communication: Mobility status ?PT Visit Diagnosis: Unsteadiness on feet (R26.81);Muscle weakness (generalized) (M62.81);Difficulty in walking, not elsewhere  classified (R26.2) ?  ? ?Time: 6580-0634 ?PT Time Calculation (min) (ACUTE ONLY): 25 min ? ? ?Charges:   PT Evaluation ?$PT Eval Moderate Complexity: 1 Mod ?PT Treatments ?$Therapeutic Activity: 8-22

## 2021-06-25 NOTE — Progress Notes (Signed)
? Eddie Sanders  VEH:[id number] DOB: 29-Sep-1928 DOA: 06/23/2021 ?PCP: Crist Infante, MD   ? 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  He was evaluated for similar symptoms in the ER 06/20/2021, and was diagnosed with a UTI at the time and discharged home on antibiotics.  Unfortunately he continued to have worsening confusion at home and therefore was brought back to the hospital 06/23/2021.  In the ER CT head revealed no acute changes.  Repeat UA was not suggestive of UTI. ? ?Consultants:  ?None ? ?Code Status: FULL CODE ? ?DVT prophylaxis: ?Lovenox ? ?Interim Hx: ?Afebrile.  Vital signs stable.  Mild tachycardia at 110.  Saturation 100% room air.  Is calm and interactive at the time of my evaluation but confused.  Family is at bedside and states he has improved significantly overall.  Patient denies specific complaints at present. ? ?Later in the day when I was on the unit I was alerted by his nurse to the fact that he had become severely agitated.  He was biting staff kicking and swinging at staff.  2 doses of Haldol for a total of 3 mg had virtually no effect on him.  Due to a high risk for him falling and interfering with medical equipment he had to be placed in restraints. ? ?Assessment & Plan: ? ?Acute encephalopathy ?acute metabolic encephalopathy complicated with delirium- - received 1 mg Lorazepam IV in the ED on admission -has shown slow improvement during his hospital stay with no evidence of acute inciting infection -ammonia normal -RPR negative -B12 584 -TSH normal -EEG without evidence of seizure activity -suspect he is now exhibiting signs of sundowning -add trial of Seroquel -avoid benzodiazepines -the mobility was to discharge him to a familiar environment as soon as possible, but his  level of agitation makes that quite difficult ? ?Uncontrolled type 2 diabetes mellitus with hyperglycemia  ?A1c 8.5 -continue SSI -no indication for overly strict control given advanced age -present CBG acceptable at 178-195 ? ?Complex sleep apnea syndrome ?Holding CPAP given acute confusion ? ?Post-polio syndrome ?Requires a scooter at baseline ? ? ?Family Communication: Spoke with daughter and wife at bedside at length ?Disposition: Therapy is recommending SNF rehab stay ? ? ?Objective: ?Blood pressure (!) 154/100, pulse (!) 110, temperature 98.5 ?F (36.9 ?C), temperature source Oral, resp. rate 16, SpO2 100 %. ? ?Intake/Output Summary (Last 24 hours) at 06/25/2021 0947 ?Last data filed at 06/25/2021 8366 ?Gross per 24 hour  ?Intake 420 ml  ?Output 1500 ml  ?Net -1080 ml  ? ?There were no vitals filed for this visit. ? ?Examination: ?General: No acute respiratory distress ?Lungs: Clear to auscultation bilaterally without wheezes or crackles ?Cardiovascular: Regular rate and rhythm without murmur gallop or rub normal S1 and S2 ?Abdomen: Nontender, nondistended, soft, bowel sounds positive, no rebound, no ascites, no appreciable mass ?Extremities: No significant cyanosis, clubbing, or edema bilateral lower extremities ? ?CBC: ?Recent Labs  ?Lab 06/20/21 ?1603 06/20/21 ?1908 06/23/21 ?0255 06/23/21 ?0326 06/24/21 ?0200 06/25/21 ?0302  ?WBC 8.6  --  11.1*  --  13.4* 9.9  ?NEUTROABS 6.0  --  8.4*  --   --   --   ?HGB 14.2   < > 14.0 13.6 13.8 13.7  ?HCT 42.2   < > 40.1 40.0 40.3 39.7  ?MCV 90.0  --  90.1  --  89.2 89.2  ?PLT 213  --  213  --  196 188  ? < > = values in this interval not displayed.  ? ?Basic Metabolic Panel: ?Recent Labs  ?Lab 06/23/21 ?0255 06/23/21 ?0326 06/24/21 ?0200 06/25/21 ?0302  ?NA 135 136 136 137  ?K 3.9 3.9 4.0 4.7  ?CL 104  --  105 107  ?CO2 19*  --  19* 21*  ?GLUCOSE 224*  --  171* 196*  ?BUN 20  --  17 16  ?CREATININE 0.92  --  0.94 0.94  ?CALCIUM 9.2  --  8.9 9.1  ? ?GFR: ?Estimated  Creatinine Clearance: 54.5 mL/min (by C-G formula based on SCr of 0.94 mg/dL). ? ?Liver Function Tests: ?Recent Labs  ?Lab 06/20/21 ?1603 06/23/21 ?0255  ?AST 11* 15  ?ALT 13 15  ?ALKPHOS 58 54  ?BILITOT 0.6 1.1  ?PROT 6.8 6.2*  ?ALBUMIN 4.0 3.4*  ? ?Recent Labs  ?Lab 06/20/21 ?1710 06/23/21 ?0645  ?AMMONIA 19 20  ? ? ?Coagulation Profile: ?Recent Labs  ?Lab 06/23/21 ?0255  ?INR 1.0  ? ? ?HbA1C: ?Hgb A1c MFr Bld  ?Date/Time Value Ref Range Status  ?06/23/2021 02:55 AM 8.5 (H) 4.8 - 5.6 % Final  ?  Comment:  ?  (NOTE) ?        Prediabetes: 5.7 - 6.4 ?        Diabetes: >6.4 ?        Glycemic control for adults with diabetes: <7.0 ?  ? ? ?CBG: ?Recent Labs  ?Lab 06/24/21 ?1501 06/24/21 ?1624 06/24/21 ?2124 06/25/21 ?9169 06/25/21 ?4503  ?GLUCAP 239* 190* 195* 178* 186*  ? ? ?Scheduled Meds: ? enoxaparin (LOVENOX) injection  40 mg Subcutaneous Daily  ? insulin aspart  0-6 Units Subcutaneous TID WC  ? multivitamins with iron  1 tablet Oral Daily  ? thiamine  500 mg Oral BID  ? ?Continuous Infusions: ? lactated ringers 75 mL/hr at 06/24/21 1158  ? ? ? LOS: 2 days  ? ?Cherene Altes, MD ?[facility] ?Office  [phone] ?Pager - Text Page per Shea Evans ? ?If 7PM-7AM, please contact night-coverage per Amion ?06/25/2021, 9:47 AM ? ? ? ? ?

## 2021-06-26 DIAGNOSIS — G9340 Encephalopathy, unspecified: Secondary | ICD-10-CM | POA: Diagnosis not present

## 2021-06-26 LAB — GLUCOSE, CAPILLARY
Glucose-Capillary: 122 mg/dL — ABNORMAL HIGH (ref 70–99)
Glucose-Capillary: 118 mg/dL — ABNORMAL HIGH (ref 70–99)
Glucose-Capillary: 128 mg/dL — ABNORMAL HIGH (ref 70–99)
Glucose-Capillary: 223 mg/dL — ABNORMAL HIGH (ref 70–99)

## 2021-06-26 NOTE — Progress Notes (Signed)
? Eddie Sanders  EVO:[id number] DOB: 05/03/1928 DOA: 06/23/2021 ?PCP: Crist Infante, MD   ? 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  He was evaluated for similar symptoms in the ER 06/20/2021, and was diagnosed with a UTI at the time and discharged home on antibiotics.  Unfortunately he continued to have worsening confusion at home and therefore was brought back to the hospital 06/23/2021.  In the ER CT head revealed no acute changes.  Repeat UA was not suggestive of UTI. ? ?Consultants:  ?None ? ?Code Status: NO CODE  ? ?DVT prophylaxis: ?Lovenox ? ?Interim Hx: ?Late afternoon/yesterday evening the patient became severely agitated and combative.  He required sedating medications and use of restraints to protect medical equipment.  The patient is sedated at the time of my visit.  He appears to be sleeping comfortably.  There is no evidence of agitation or uncontrolled pain.  There is no distress appreciable. ? ?Assessment & Plan: ? ?Acute encephalopathy ?acute metabolic encephalopathy complicated with delirium - has shown no evidence of acute inciting infection -ammonia normal -RPR negative -B12 584 -TSH normal -EEG without evidence of seizure activity -family has revealed the patient was started on Neurontin approximately 7 days prior to the onset of his confusion -I suspect this is likely the etiology of his symptoms, plus or minus a UTI, and that now he is exhibiting signs of sundowning due to his ongoing hospital stay - added Seroquel 5/10 - avoid benzodiazepines -the desire is to discharge him to a familiar environment as soon as possible, but his level of agitation makes that quite difficult -if he can remain calm today and through the night without use of sedatives or restraints we will plan for discharge  5/12 ? ?Uncontrolled type 2 diabetes mellitus with hyperglycemia  ?A1c 8.5 -continue SSI -no indication for overly strict control given advanced age -present CBG acceptable ? ?Complex sleep apnea syndrome ?Holding CPAP given acute confusion ? ?Post-polio syndrome ?Requires a scooter at baseline ? ? ?Family Communication: No family present at time of exam today ?Disposition: Therapy is recommending SNF rehab stay ? ? ?Objective: ?Blood pressure (!) 148/91, pulse 80, temperature 98 ?F (36.7 ?C), temperature source Oral, resp. rate 16, SpO2 98 %. ? ?Intake/Output Summary (Last 24 hours) at 06/26/2021 0846 ?Last data filed at 06/26/2021 0600 ?Gross per 24 hour  ?Intake 120 ml  ?Output 1200 ml  ?Net -1080 ml  ? ? ?There were no vitals filed for this visit. ? ?Examination: ?General: No acute respiratory distress ?Lungs: Clear to auscultation bilaterally ?Cardiovascular: RRR ?Abdomen: Nondistended soft bowel sounds positive ?Extremities: Trace lower extremity edema bilaterally ? ?CBC: ?Recent Labs  ?Lab 06/20/21 ?1603 06/20/21 ?1908 06/23/21 ?0255 06/23/21 ?0326 06/24/21 ?0200 06/25/21 ?0302  ?WBC 8.6  --  11.1*  --  13.4* 9.9  ?NEUTROABS 6.0  --  8.4*  --   --   --   ?HGB 14.2   < > 14.0 13.6 13.8 13.7  ?HCT 42.2   < > 40.1 40.0 40.3 39.7  ?MCV 90.0  --  90.1  --  89.2 89.2  ?PLT 213  --  213  --  196 188  ? < > = values in this interval not displayed.  ? ? ?Basic Metabolic Panel: ?Recent Labs  ?Lab 06/23/21 ?0255 06/23/21 ?0326 06/24/21 ?0200  06/25/21 ?0302  ?NA 135 136 136 137  ?K 3.9 3.9 4.0 4.7  ?CL 104  --  105 107  ?CO2 19*  --  19* 21*  ?GLUCOSE 224*  --  171* 196*  ?BUN 20  --  17 16  ?CREATININE 0.92  --  0.94 0.94  ?CALCIUM 9.2  --  8.9 9.1  ? ? ?GFR: ?Estimated Creatinine Clearance: 54.5 mL/min (by C-G formula based on SCr of 0.94 mg/dL). ? ?Liver Function Tests: ?Recent Labs  ?Lab 06/20/21 ?1603 06/23/21 ?0255  ?AST 11* 15  ?ALT 13 15  ?ALKPHOS 58 54  ?BILITOT 0.6 1.1  ?PROT 6.8 6.2*  ?ALBUMIN 4.0 3.4*   ? ? ?Recent Labs  ?Lab 06/20/21 ?1710 06/23/21 ?0645  ?AMMONIA 19 20  ? ? ?HbA1C: ?Hgb A1c MFr Bld  ?Date/Time Value Ref Range Status  ?06/23/2021 02:55 AM 8.5 (H) 4.8 - 5.6 % Final  ?  Comment:  ?  (NOTE) ?        Prediabetes: 5.7 - 6.4 ?        Diabetes: >6.4 ?        Glycemic control for adults with diabetes: <7.0 ?  ? ? ?Scheduled Meds: ? enoxaparin (LOVENOX) injection  40 mg Subcutaneous Daily  ? insulin aspart  0-6 Units Subcutaneous TID WC  ? multivitamins with iron  1 tablet Oral Daily  ? QUEtiapine  25 mg Oral QHS  ? thiamine  500 mg Oral BID  ? ? LOS: 3 days  ? ?Cherene Altes, MD ?[facility] ?Office  [phone] ?Pager - Text Page per Shea Evans ? ?If 7PM-7AM, please contact night-coverage per Amion ?06/26/2021, 8:46 AM ? ? ? ? ?

## 2021-06-26 NOTE — Plan of Care (Signed)
Pt is in  bed resting.  Pt took PO seroquel tonight. Pt is calm and cooperative.  ? ? ? ?Problem: Safety: ?Goal: Non-violent Restraint(s) ?Outcome: Progressing ?  ?Problem: Education: ?Goal: Knowledge of General Education information will improve ?Description: Including pain rating scale, medication(s)/side effects and non-pharmacologic comfort measures ?Outcome: Progressing ?  ?Problem: Health Behavior/Discharge Planning: ?Goal: Ability to manage health-related needs will improve ?Outcome: Progressing ?  ?Problem: Clinical Measurements: ?Goal: Ability to maintain clinical measurements within normal limits will improve ?Outcome: Progressing ?Goal: Will remain free from infection ?Outcome: Progressing ?Goal: Diagnostic test results will improve ?Outcome: Progressing ?Goal: Respiratory complications will improve ?Outcome: Progressing ?Goal: Cardiovascular complication will be avoided ?Outcome: Progressing ?  ?Problem: Activity: ?Goal: Risk for activity intolerance will decrease ?Outcome: Progressing ?  ?Problem: Nutrition: ?Goal: Adequate nutrition will be maintained ?Outcome: Progressing ?  ?Problem: Coping: ?Goal: Level of anxiety will decrease ?Outcome: Progressing ?  ?Problem: Elimination: ?Goal: Will not experience complications related to bowel motility ?Outcome: Progressing ?Goal: Will not experience complications related to urinary retention ?Outcome: Progressing ?  ?Problem: Pain Managment: ?Goal: General experience of comfort will improve ?Outcome: Progressing ?  ?Problem: Safety: ?Goal: Ability to remain free from injury will improve ?Outcome: Progressing ?  ?Problem: Skin Integrity: ?Goal: Risk for impaired skin integrity will decrease ?Outcome: Progressing ?  ?

## 2021-06-26 NOTE — Plan of Care (Signed)
?  Problem: Safety: ?Goal: Non-violent Restraint(s) ?Outcome: Progressing ?  ?Problem: Activity: ?Goal: Risk for activity intolerance will decrease ?Outcome: Progressing ?  ?Problem: Nutrition: ?Goal: Adequate nutrition will be maintained ?Outcome: Progressing ?  ?Problem: Coping: ?Goal: Level of anxiety will decrease ?Outcome: Progressing ?  ?

## 2021-06-26 NOTE — Care Management Important Message (Signed)
Important Message ? ?Patient Details  ?Name: Eddie Sanders ?MRN: [id number] ?Date of Birth: 1928-08-15 ? ? ?Medicare Important Message Given:  Yes ? ? ? ? ?Milia Warth ?06/26/2021, 2:53 PM ?

## 2021-06-27 DIAGNOSIS — G9340 Encephalopathy, unspecified: Secondary | ICD-10-CM | POA: Diagnosis not present

## 2021-06-27 LAB — GLUCOSE, CAPILLARY
Glucose-Capillary: 127 mg/dL — ABNORMAL HIGH (ref 70–99)
Glucose-Capillary: 179 mg/dL — ABNORMAL HIGH (ref 70–99)

## 2021-06-27 MED ORDER — QUETIAPINE FUMARATE 25 MG PO TABS: 25.0000 mg | ORAL_TABLET | Freq: Every day | ORAL | 0 refills | Status: DC

## 2021-06-27 NOTE — Discharge Summary (Signed)
? ?DISCHARGE SUMMARY ? ?Eddie Sanders ? ?MR#: [id number] ? ?DOB:March 29, 1928  ?Date of Admission: 06/23/2021 ?Date of Discharge: 06/27/2021 ? ?Attending Physician:Tuyen Uncapher Hennie Duos, MD ? ?Patient's SWH:QPRFFM, [name], MD ? ?Consults: none ? ?Disposition: D/C to SNF ? ?Follow-up Appts: ? Contact information for after-discharge care   ? ? Destination   ? ? [facility] .   ?Service: Skilled Nursing ?Contact information: ?[address] ?[place] ?[phone] ? ?  ?  ? ?  ?  ? ?  ?  ? ?  ? ? ?Discharge Diagnoses: ?Acute encephalopathy ?Sundowning  ?Uncontrolled type 2 diabetes mellitus with hyperglycemia  ?Complex sleep apnea syndrome ?Post-polio syndrome ? 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 He was evaluated for similar symptoms in the ER 06/20/2021, and was diagnosed with a UTI at the time and discharged home on antibiotics. Unfortunately he continued to have worsening confusion at home and therefore was brought back to the hospital 06/23/2021. In the ER CT head revealed no acute changes. Repeat UA was not suggestive of UTI. ? ?Hospital Course: ? ?Acute encephalopathy ?acute metabolic encephalopathy complicated with delirium - has shown no evidence of acute inciting infection -ammonia normal -RPR negative -B12 584 -TSH normal -EEG without evidence of seizure activity -family has revealed the patient was started on Neurontin approximately 7 days prior to the onset of his confusion -I suspect this is likely the etiology of his symptoms, plus or minus a UTI, and that now he is exhibiting signs of sundowning due to his ongoing hospital stay - added Seroquel 5/10 - avoid benzodiazepines - the desire is to discharge him to a familiar environment as soon as  possible ?  ?Uncontrolled type 2 diabetes mellitus with hyperglycemia  ?A1c 8.5 - no indication for overly strict control given advanced age -present CBG acceptable - resume glucophage at d/c  ?  ?Complex sleep apnea syndrome ?Holding CPAP given acute confusion ?  ?Post-polio syndrome ?Requires a scooter at baseline ? ?Allergies as of 06/27/2021   ? ?   Reactions  ? Penicillins Hives, Swelling, Rash  ? ?  ? ?  ?Medication List  ?  ? ?STOP taking these medications   ? ?cephALEXin 500 MG capsule ?Commonly known as: KEFLEX ?  ?gabapentin 100 MG capsule ?Commonly known as: NEURONTIN ?  ? ?  ? ?TAKE these medications   ? ?acetaminophen 325 MG tablet ?Commonly known as: TYLENOL ?Take 650 mg by mouth every 4 (four) hours as needed for mild pain. ?  ?metFORMIN 500 MG tablet ?Commonly known as: GLUCOPHAGE ?Take 500 mg by mouth 2 (two) times daily with a meal. ?  ?QUEtiapine 25 MG tablet ?Commonly known as: SEROQUEL ?Take 1 tablet (25 mg total) by mouth at bedtime. ?  ?simvastatin 20 MG tablet ?Commonly known as: ZOCOR ?Take 20 mg by mouth 3 (three) times a week. ?  ? ?  ? ? ?Day of Discharge ?BP (!) 119/102 (BP Location: Left Arm)   Pulse (!) 108   Temp 98.2 ?F (36.8 ?C) (Oral)   Resp 18   SpO2 97%  ? ?Physical Exam: ?General: No acute respiratory distress ?Lungs: Clear to auscultation bilaterally without wheezes or crackles ?Cardiovascular: Regular rate and rhythm without murmur gallop or rub normal S1 and S2 ?Abdomen: Nontender, nondistended, soft, bowel sounds  positive, no rebound, no ascites, no appreciable mass ?Extremities: No significant cyanosis, clubbing, or edema bilateral lower extremities ? ?Basic Metabolic Panel: ?Recent Labs  ?Lab 06/20/21 ?1603 06/20/21 ?1908 06/23/21 ?0255 06/23/21 ?0326 06/24/21 ?0200 06/25/21 ?0302  ?NA 136 136 135 136 136 137  ?K 4.4 4.3 3.9 3.9 4.0 4.7  ?CL 100  --  104  --  105 107  ?CO2 29  --  19*  --  19* 21*  ?GLUCOSE 244*  --  224*  --  171* 196*  ?BUN 27*  --  20  --  17 16   ?CREATININE 1.08  --  0.92  --  0.94 0.94  ?CALCIUM 9.4  --  9.2  --  8.9 9.1  ? ?Liver Function Tests: ?Recent Labs  ?Lab 06/20/21 ?1603 06/23/21 ?0255  ?AST 11* 15  ?ALT 13 15  ?ALKPHOS 58 54  ?BILITOT 0.6 1.1  ?PROT 6.8 6.2*  ?ALBUMIN 4.0 3.4*  ? ?Recent Labs  ?Lab 06/20/21 ?1710 06/23/21 ?0645  ?AMMONIA 19 20  ? ?CBC: ?Recent Labs  ?Lab 06/20/21 ?1603 06/20/21 ?1908 06/23/21 ?0255 06/23/21 ?0326 06/24/21 ?0200 06/25/21 ?0302  ?WBC 8.6  --  11.1*  --  13.4* 9.9  ?NEUTROABS 6.0  --  8.4*  --   --   --   ?HGB 14.2 13.9 14.0 13.6 13.8 13.7  ?HCT 42.2 41.0 40.1 40.0 40.3 39.7  ?MCV 90.0  --  90.1  --  89.2 89.2  ?PLT 213  --  213  --  196 188  ? ? ?Time spent in discharge (includes decision making & examination of pt): ?35 minutes ? ?06/27/2021, 10:09 AM  ? ?Cherene Altes, MD ?[facility] ?Office  [phone] ? ? ? ? ? ?

## 2021-06-27 NOTE — Progress Notes (Signed)
OT Cancellation Note ? ?Patient Details ?Name: Eddie Sanders ?MRN: [id number] ?DOB: [birth year] ? ? ?Cancelled Treatment:    Reason Eval/Treat Not Completed: Patient declined, no reason specified (Patient's family refused for patient due to expected to discharge to SNF today.) ?Lodema Hong, OTA ?Acute Rehabilitation Services  ?Pager [phone] ?Office [phone] ? ?[place] ?06/27/2021, 11:52 AM ?

## 2021-06-27 NOTE — TOC Transition Note (Signed)
Transition of Care (TOC) - CM/SW Discharge Note ? ? ?Patient Details  ?Name: Eddie Sanders ?MRN: [id number] ?Date of Birth: 1929-02-16 ? ?[facility] of [facility] [place] CM/SW Contact:  ?Geralynn Ochs, LCSW ?Phone Number: ?06/27/2021, 11:01 AM ? ? ?Clinical Narrative:   CSW updated by MD that patient is medically stable for return to [facility] today. CSW confirmed with [facility] that patient could return, and spoke with family who are also in agreement. Transport arranged with [facility] for next available. ? ?Nurse to call report to [phone], Room 152. ? ? ? ?Final next level of care: [place] ?Barriers to Discharge: Barriers Resolved ? ? ?Patient Goals and CMS Choice ?Patient states their goals for this hospitalization and ongoing recovery are:: patient unable to participate in goal setting, not fully oriented ?CMS Medicare.gov Compare Post Acute Care list provided to:: Patient Represenative (must comment) ?Choice offered to / list presented to : Spouse, Adult Children ? ?Discharge Placement ?  ?           ?Patient chooses bed at: [facility] ?Patient to be transferred to facility by: [facility] ?Name of family member notified: [name] ?Patient and family notified of of transfer: 06/27/21 ? ?Discharge Plan and Services ?  ?  ?Post Acute Care Choice: [place]          ?  ?  ?  ?  ?  ?  ?  ?  ?  ?  ? ?Social Determinants of Health (SDOH) Interventions ?  ? ? ?Readmission Risk Interventions ?   ? View : No data to display.  ?  ?  ?  ? ? ? ? ? ?

## 2021-06-27 NOTE — Progress Notes (Signed)
PT Cancellation Note ? ?Patient Details ?Name: Eddie Sanders ?MRN: [id number] ?DOB: 1928-09-12 ? ? ?Cancelled Treatment:    Reason Eval/Treat Not Completed: Other (comment).  (Patient's family refused for patient due to expected to discharge to SNF today.) ? ? ?Ramond Dial ?06/27/2021, 12:08 PM ? ?Mee Hives, PT PhD ?Acute Rehab Dept. Number: [facility] [phone] and [place] [phone] ? ?

## 2021-06-27 NOTE — Plan of Care (Signed)

## 2021-06-27 NOTE — TOC Progression Note (Signed)
Transition of Care [facility]) - Progression Note    Patient Details  Name: Eddie Sanders MRN: [id number] Date of Birth: Jun 17, 1928  Transition of Care [facility]) CM/SW [place], [place] Phone Number: 06/27/2021, 10:56 AM  Clinical Narrative:   CSW spoke with [facility] as well as patient's family that patient's behaviors are still not stable at this time, hopeful to admit back to [facility] tomorrow if able. Family in agreement. CSW to follow.    Expected Discharge Plan: [place] Barriers to Discharge: Continued Medical Work up  Expected Discharge Plan and Services Expected Discharge Plan: [place] Choice: [place] arrangements for the past 2 months: [place] Expected Discharge Date: 06/27/21                                     Social Determinants of Health (SDOH) Interventions    Readmission Risk Interventions     View : No data to display.

## 2021-06-27 NOTE — Progress Notes (Signed)
Discharged to [facility] nursing facility after IV access removed and report and discharge instructions given to nurse Kenly.  Wife and daughter at side and aware of discharge.   ?

## 2021-06-28 LAB — CULTURE, BLOOD (ROUTINE X 2)
Culture: NO GROWTH
Culture: NO GROWTH
Special Requests: ADEQUATE
Special Requests: ADEQUATE

## 2021-06-30 ENCOUNTER — Non-Acute Institutional Stay (SKILLED_NURSING_FACILITY): Payer: Medicare PPO | Admitting: Internal Medicine

## 2021-06-30 ENCOUNTER — Encounter: Payer: Self-pay | Admitting: Internal Medicine

## 2021-06-30 DIAGNOSIS — E1165 Type 2 diabetes mellitus with hyperglycemia: Secondary | ICD-10-CM

## 2021-06-30 DIAGNOSIS — G4731 Primary central sleep apnea: Secondary | ICD-10-CM | POA: Diagnosis not present

## 2021-06-30 DIAGNOSIS — G14 Postpolio syndrome: Secondary | ICD-10-CM

## 2021-06-30 DIAGNOSIS — E785 Hyperlipidemia, unspecified: Secondary | ICD-10-CM

## 2021-06-30 DIAGNOSIS — G9340 Encephalopathy, unspecified: Secondary | ICD-10-CM | POA: Diagnosis not present

## 2021-06-30 NOTE — Progress Notes (Signed)
?Provider:  Veleta Miners MD ?Location:    [facility] Room Number: [id number] ?Place of Service:  SNF (31) ? ?PCP: Crist Infante, MD ?Patient Care Team: ?Crist Infante, MD as PCP - General (Internal Medicine) ? ?Extended Emergency Contact Information ?Primary Emergency Contact: Scheerer,JANE H ?Address: [address] ?         [place]  [place] ?Home Phone: [phone] ?Mobile Phone: [phone] ?Relation: Spouse ?Secondary Emergency Contact: Hollar,Sarah ?Mobile Phone: [phone] ?Relation: Daughter ? ?Code Status: Full Code ?Goals of Care: Advanced Directive information ? ?  06/30/2021  ?  9:37 AM  ?Advanced Directives  ?Does Patient Have a Medical Advance Directive? Yes  ?Type of Advance Directive Living will  ?Does patient want to make changes to medical advance directive? No - Patient declined  ? ? ? ? ?Chief Complaint  ?Patient presents with  ? New Admit To SNF  ?  Admission to SNF  ? ? ?HPI: Patient is a 88 y.o. male seen today for admission to SNF for Therapy ? ?Admitted in the hospital from 05/08-05/12 for Acute Encephalopathy ? ?Patient has h/o Diabetes Mellitus, Post Polio Syndrome HLD and Complex Sleep Apnea ? ?He per his wife was very Independent and was doing all his ADLS and IADLS. He would walk with his walker. ?He was seen by his PCP few weeks ago for low back pain ?Started on Prednisone and Gabapentin ?He started having hallucinations and became Combative ?CT scan No acute changes ?No Signs of Infection ?IT was thought ot be related to his meds ?Started on Seroquel at night  ?He is now in Rehab ?Was confused over the weekend Did have one fall when he was trying to get up by himself ?This morning very pleasant no Hallucinations ?Slept well ?Eating well. No fever  ? ? ? ?Past Medical History:  ?Diagnosis Date  ? Adenomatous colon polyp   ? Allergic rhinitis   ? Arthritis   ? GERD (gastroesophageal reflux disease)   ? Hyperlipidemia   ? Polio 1939  ? Post-polio  syndrome   ? Prostate cancer ([place])   ? Skin cancer (melanoma) ([place])   ? Right eye  ? ?Past Surgical History:  ?Procedure Laterality Date  ? cataract surgery    ? bilateral  ? LUMBAR EPIDURAL INJECTION    ? LUMBAR LAMINECTOMY/DECOMPRESSION MICRODISCECTOMY Right 07/19/2013  ? Procedure: LUMBAR LAMINECTOMY/DECOMPRESSION MICRODISCECTOMY 2 LEVELS     lumbar  three/four,  four/five;  Surgeon: Eustace Moore, MD;  Location: [facility];  Service: Neurosurgery;  Laterality: Right;  ? PROSTATECTOMY    ? sigmoid colectomy    ? TONSILLECTOMY    ? ? reports that he has been smoking cigars. He has never used smokeless tobacco. He reports current alcohol use. He reports that he does not use drugs. ?Social History  ? ?Socioeconomic History  ? Marital status: Married  ?  Spouse name: Not on file  ? Number of children: 1  ? Years of education: Not on file  ? Highest education level: Not on file  ?Occupational History  ? Occupation: retired  ?Tobacco Use  ? Smoking status: Some Days  ?  Types: Cigars  ? Smokeless tobacco: Never  ? Tobacco comments:  ?  has occasional cigar ;stopped cigarettes in 1965  ?Substance and Sexual Activity  ? Alcohol use: Yes  ?  Alcohol/week: 0.0 standard drinks  ?  Comment: 2 oz scotch, 4 oz wine  ? Drug use: No  ? Sexual activity: Not  on file  ?Other Topics Concern  ? Not on file  ?Social History Narrative  ? Not on file  ? ?Social Determinants of Health  ? ?Financial Resource Strain: Not on file  ?Food Insecurity: Not on file  ?Transportation Needs: Not on file  ?Physical Activity: Not on file  ?Stress: Not on file  ?Social Connections: Not on file  ?Intimate Partner Violence: Not on file  ? ? ?Functional Status Survey: ?  ? ?Family History  ?Problem Relation Age of Onset  ? Pancreatic cancer Father   ? Cancer Father   ? Heart failure Mother   ? Heart disease Mother   ? Colon cancer Neg Hx   ? ? ?Health Maintenance  ?Topic Date Due  ? FOOT EXAM  Never done  ? OPHTHALMOLOGY EXAM  Never done  ? URINE  MICROALBUMIN  Never done  ? Pneumonia Vaccine 55+ Years old (2 - PCV) 05/10/2013  ? Zoster Vaccines- Shingrix (2 of 2) 06/05/2018  ? COVID-19 Vaccine (1) 11/14/2020  ? INFLUENZA VACCINE  09/16/2021  ? HEMOGLOBIN A1C  12/24/2021  ? TETANUS/TDAP  02/12/2029  ? HPV VACCINES  Aged Out  ? ? ?Allergies  ?Allergen Reactions  ? Penicillins Hives, Swelling and Rash  ? ? ?Allergies as of 06/30/2021   ? ?   Reactions  ? Penicillins Hives, Swelling, Rash  ? ?  ? ?  ?Medication List  ?  ? ?  ? Accurate as of Jun 30, 2021  9:40 AM. If you have any questions, ask your nurse or doctor.  ?  ?  ? ?  ? ?acetaminophen 325 MG tablet ?Commonly known as: TYLENOL ?Take 650 mg by mouth every 4 (four) hours as needed for mild pain. ?  ?metFORMIN 500 MG tablet ?Commonly known as: GLUCOPHAGE ?Take 500 mg by mouth 2 (two) times daily with a meal. ?  ?QUEtiapine 25 MG tablet ?Commonly known as: SEROQUEL ?Take 1 tablet (25 mg total) by mouth at bedtime. ?  ?simvastatin 20 MG tablet ?Commonly known as: ZOCOR ?Take 20 mg by mouth 3 (three) times a week. ?  ? ?  ? ? ?Review of Systems  ?Constitutional:  Positive for activity change. Negative for appetite change and unexpected weight change.  ?HENT: Negative.    ?Respiratory:  Negative for cough and shortness of breath.   ?Cardiovascular:  Negative for leg swelling.  ?Gastrointestinal:  Negative for constipation.  ?Genitourinary:  Negative for frequency.  ?Musculoskeletal:  Positive for gait problem. Negative for arthralgias and myalgias.  ?Skin: Negative.  Negative for rash.  ?Neurological:  Positive for weakness. Negative for dizziness.  ?Psychiatric/Behavioral:  Positive for confusion. Negative for sleep disturbance.   ?All other systems reviewed and are negative. ? ?Vitals:  ? 06/30/21 0932  ?BP: 133/75  ?Pulse: 67  ?Resp: 18  ?Temp: 97.9 ?F (36.6 ?C)  ?SpO2: 99%  ?Weight: 188 lb 8 oz (85.5 kg)  ?Height: '5\' 11"'$  (1.803 m)  ? ?Body mass index is 26.29 kg/m?Marland Kitchen ?Physical Exam ?Vitals reviewed.   ?Constitutional:   ?   Appearance: Normal appearance.  ?HENT:  ?   Head: Normocephalic.  ?   Nose: Nose normal.  ?   Mouth/Throat:  ?   Mouth: Mucous membranes are moist.  ?   Pharynx: Oropharynx is clear.  ?Eyes:  ?   Pupils: Pupils are equal, round, and reactive to light.  ?Cardiovascular:  ?   Rate and Rhythm: Normal rate and regular rhythm.  ?   Pulses: Normal  pulses.  ?   Heart sounds: No murmur heard. ?Pulmonary:  ?   Effort: Pulmonary effort is normal. No respiratory distress.  ?   Breath sounds: Normal breath sounds. No rales.  ?Abdominal:  ?   General: Abdomen is flat. Bowel sounds are normal.  ?   Palpations: Abdomen is soft.  ?Musculoskeletal:     ?   General: No swelling.  ?   Cervical back: Neck supple.  ?Skin: ?   General: Skin is warm.  ?Neurological:  ?   Mental Status: He is alert.  ?   Comments: Good strength in UE ?Lowe Extremity Right side was 2- 3/5 Left Side was 4/5 ?Right much more weaker then Left ?Speech was normal ? ?Was alert and Oriented ?Did not remember much from the hospital  ?Denies having any more Hallucinations since Yesterday  ?Psychiatric:     ?   Mood and Affect: Mood normal.     ?   Thought Content: Thought content normal.  ? ? ?Labs reviewed: ?Basic Metabolic Panel: ?Recent Labs  ?  06/23/21 ?0255 06/23/21 ?0326 06/24/21 ?0200 06/25/21 ?0302  ?NA 135 136 136 137  ?K 3.9 3.9 4.0 4.7  ?CL 104  --  105 107  ?CO2 19*  --  19* 21*  ?GLUCOSE 224*  --  171* 196*  ?BUN 20  --  17 16  ?CREATININE 0.92  --  0.94 0.94  ?CALCIUM 9.2  --  8.9 9.1  ? ?Liver Function Tests: ?Recent Labs  ?  06/20/21 ?1603 06/23/21 ?0255  ?AST 11* 15  ?ALT 13 15  ?ALKPHOS 58 54  ?BILITOT 0.6 1.1  ?PROT 6.8 6.2*  ?ALBUMIN 4.0 3.4*  ? ?No results for input(s): LIPASE, AMYLASE in the last 8760 hours. ?Recent Labs  ?  06/20/21 ?1710 06/23/21 ?0645  ?AMMONIA 19 20  ? ?CBC: ?Recent Labs  ?  06/20/21 ?1603 06/20/21 ?1908 06/23/21 ?0255 06/23/21 ?0326 06/24/21 ?0200 06/25/21 ?0302  ?WBC 8.6  --  11.1*  --  13.4* 9.9   ?NEUTROABS 6.0  --  8.4*  --   --   --   ?HGB 14.2   < > 14.0 13.6 13.8 13.7  ?HCT 42.2   < > 40.1 40.0 40.3 39.7  ?MCV 90.0  --  90.1  --  89.2 89.2  ?PLT 213  --  213  --  196 188  ? < > = values in this interval not

## 2021-07-01 DIAGNOSIS — G14 Postpolio syndrome: Secondary | ICD-10-CM | POA: Diagnosis not present

## 2021-07-01 DIAGNOSIS — E1165 Type 2 diabetes mellitus with hyperglycemia: Secondary | ICD-10-CM | POA: Diagnosis not present

## 2021-07-01 DIAGNOSIS — R278 Other lack of coordination: Secondary | ICD-10-CM | POA: Diagnosis not present

## 2021-07-01 DIAGNOSIS — G9341 Metabolic encephalopathy: Secondary | ICD-10-CM | POA: Diagnosis not present

## 2021-07-01 DIAGNOSIS — M62562 Muscle wasting and atrophy, not elsewhere classified, left lower leg: Secondary | ICD-10-CM | POA: Diagnosis not present

## 2021-07-01 DIAGNOSIS — M62561 Muscle wasting and atrophy, not elsewhere classified, right lower leg: Secondary | ICD-10-CM | POA: Diagnosis not present

## 2021-07-01 DIAGNOSIS — G4739 Other sleep apnea: Secondary | ICD-10-CM | POA: Diagnosis not present

## 2021-07-01 DIAGNOSIS — R2689 Other abnormalities of gait and mobility: Secondary | ICD-10-CM | POA: Diagnosis not present

## 2021-07-02 DIAGNOSIS — G4739 Other sleep apnea: Secondary | ICD-10-CM | POA: Diagnosis not present

## 2021-07-02 DIAGNOSIS — M62562 Muscle wasting and atrophy, not elsewhere classified, left lower leg: Secondary | ICD-10-CM | POA: Diagnosis not present

## 2021-07-02 DIAGNOSIS — G14 Postpolio syndrome: Secondary | ICD-10-CM | POA: Diagnosis not present

## 2021-07-02 DIAGNOSIS — R2689 Other abnormalities of gait and mobility: Secondary | ICD-10-CM | POA: Diagnosis not present

## 2021-07-02 DIAGNOSIS — E1165 Type 2 diabetes mellitus with hyperglycemia: Secondary | ICD-10-CM | POA: Diagnosis not present

## 2021-07-02 DIAGNOSIS — M62561 Muscle wasting and atrophy, not elsewhere classified, right lower leg: Secondary | ICD-10-CM | POA: Diagnosis not present

## 2021-07-02 DIAGNOSIS — R278 Other lack of coordination: Secondary | ICD-10-CM | POA: Diagnosis not present

## 2021-07-02 DIAGNOSIS — G9341 Metabolic encephalopathy: Secondary | ICD-10-CM | POA: Diagnosis not present

## 2021-07-03 DIAGNOSIS — G14 Postpolio syndrome: Secondary | ICD-10-CM | POA: Diagnosis not present

## 2021-07-03 DIAGNOSIS — R278 Other lack of coordination: Secondary | ICD-10-CM | POA: Diagnosis not present

## 2021-07-03 DIAGNOSIS — M62561 Muscle wasting and atrophy, not elsewhere classified, right lower leg: Secondary | ICD-10-CM | POA: Diagnosis not present

## 2021-07-03 DIAGNOSIS — E1165 Type 2 diabetes mellitus with hyperglycemia: Secondary | ICD-10-CM | POA: Diagnosis not present

## 2021-07-03 DIAGNOSIS — G4739 Other sleep apnea: Secondary | ICD-10-CM | POA: Diagnosis not present

## 2021-07-03 DIAGNOSIS — M62562 Muscle wasting and atrophy, not elsewhere classified, left lower leg: Secondary | ICD-10-CM | POA: Diagnosis not present

## 2021-07-03 DIAGNOSIS — R2689 Other abnormalities of gait and mobility: Secondary | ICD-10-CM | POA: Diagnosis not present

## 2021-07-03 DIAGNOSIS — G9341 Metabolic encephalopathy: Secondary | ICD-10-CM | POA: Diagnosis not present

## 2021-07-04 DIAGNOSIS — R278 Other lack of coordination: Secondary | ICD-10-CM | POA: Diagnosis not present

## 2021-07-04 DIAGNOSIS — R2689 Other abnormalities of gait and mobility: Secondary | ICD-10-CM | POA: Diagnosis not present

## 2021-07-04 DIAGNOSIS — G9341 Metabolic encephalopathy: Secondary | ICD-10-CM | POA: Diagnosis not present

## 2021-07-04 DIAGNOSIS — M62561 Muscle wasting and atrophy, not elsewhere classified, right lower leg: Secondary | ICD-10-CM | POA: Diagnosis not present

## 2021-07-04 DIAGNOSIS — G14 Postpolio syndrome: Secondary | ICD-10-CM | POA: Diagnosis not present

## 2021-07-04 DIAGNOSIS — E1165 Type 2 diabetes mellitus with hyperglycemia: Secondary | ICD-10-CM | POA: Diagnosis not present

## 2021-07-04 DIAGNOSIS — M62562 Muscle wasting and atrophy, not elsewhere classified, left lower leg: Secondary | ICD-10-CM | POA: Diagnosis not present

## 2021-07-04 DIAGNOSIS — G4739 Other sleep apnea: Secondary | ICD-10-CM | POA: Diagnosis not present

## 2021-07-07 ENCOUNTER — Non-Acute Institutional Stay (SKILLED_NURSING_FACILITY): Payer: Medicare PPO | Admitting: Internal Medicine

## 2021-07-07 ENCOUNTER — Encounter: Payer: Self-pay | Admitting: Internal Medicine

## 2021-07-07 DIAGNOSIS — G9340 Encephalopathy, unspecified: Secondary | ICD-10-CM

## 2021-07-07 DIAGNOSIS — R278 Other lack of coordination: Secondary | ICD-10-CM | POA: Diagnosis not present

## 2021-07-07 DIAGNOSIS — E1165 Type 2 diabetes mellitus with hyperglycemia: Secondary | ICD-10-CM

## 2021-07-07 DIAGNOSIS — R2689 Other abnormalities of gait and mobility: Secondary | ICD-10-CM | POA: Diagnosis not present

## 2021-07-07 DIAGNOSIS — G9349 Other encephalopathy: Secondary | ICD-10-CM | POA: Diagnosis not present

## 2021-07-07 DIAGNOSIS — G4739 Other sleep apnea: Secondary | ICD-10-CM | POA: Diagnosis not present

## 2021-07-07 DIAGNOSIS — G4731 Primary central sleep apnea: Secondary | ICD-10-CM | POA: Diagnosis not present

## 2021-07-07 DIAGNOSIS — E785 Hyperlipidemia, unspecified: Secondary | ICD-10-CM | POA: Diagnosis not present

## 2021-07-07 DIAGNOSIS — M62562 Muscle wasting and atrophy, not elsewhere classified, left lower leg: Secondary | ICD-10-CM | POA: Diagnosis not present

## 2021-07-07 DIAGNOSIS — R601 Generalized edema: Secondary | ICD-10-CM | POA: Diagnosis not present

## 2021-07-07 DIAGNOSIS — G9341 Metabolic encephalopathy: Secondary | ICD-10-CM | POA: Diagnosis not present

## 2021-07-07 DIAGNOSIS — M62561 Muscle wasting and atrophy, not elsewhere classified, right lower leg: Secondary | ICD-10-CM | POA: Diagnosis not present

## 2021-07-07 DIAGNOSIS — R29898 Other symptoms and signs involving the musculoskeletal system: Secondary | ICD-10-CM | POA: Diagnosis not present

## 2021-07-07 DIAGNOSIS — R41840 Attention and concentration deficit: Secondary | ICD-10-CM | POA: Diagnosis not present

## 2021-07-07 DIAGNOSIS — M6389 Disorders of muscle in diseases classified elsewhere, multiple sites: Secondary | ICD-10-CM | POA: Diagnosis not present

## 2021-07-07 DIAGNOSIS — G14 Postpolio syndrome: Secondary | ICD-10-CM | POA: Diagnosis not present

## 2021-07-07 NOTE — Progress Notes (Signed)
Location:   [facility] Room Number: 152A Place of Service:  SNF [phone]) Provider:  Veleta Miners, MD  Crist Infante, MD  Patient Care Team: Crist Infante, MD as PCP - General (Internal Medicine)  Extended Emergency Contact Information Primary Emergency Contact: Denzil Hughes Address: [address]          [place]  [place] Home Phone: [phone] Mobile Phone: [phone] Relation: Spouse Secondary Emergency Contact: [place] Mobile Phone: [phone] Relation: Daughter  Code Status:  FULL CODE Goals of care: Advanced Directive information    07/07/2021   10:41 AM  Advanced Directives  Does Patient Have a Medical Advance Directive? No  Type of Advance Directive Living will  Does patient want to make changes to medical advance directive? No - Patient declined     Chief Complaint  Patient presents with  . Acute Visit    HPI:  Pt is a 88 y.o. male seen today for an acute visit for    Past Medical History:  Diagnosis Date  . Adenomatous colon polyp   . Allergic rhinitis   . Arthritis   . GERD (gastroesophageal reflux disease)   . Hyperlipidemia   . Polio 1939  . Post-polio syndrome   . Prostate cancer ([place])   . Skin cancer (melanoma) ([place])    Right eye   Past Surgical History:  Procedure Laterality Date  . cataract surgery     bilateral  . LUMBAR EPIDURAL INJECTION    . LUMBAR LAMINECTOMY/DECOMPRESSION MICRODISCECTOMY Right 07/19/2013   Procedure: LUMBAR LAMINECTOMY/DECOMPRESSION MICRODISCECTOMY 2 LEVELS     lumbar  three/four,  four/five;  Surgeon: Eustace Moore, MD;  Location: [facility];  Service: Neurosurgery;  Laterality: Right;  . PROSTATECTOMY    . sigmoid colectomy    . TONSILLECTOMY      Allergies  Allergen Reactions  . Penicillins Hives, Swelling and Rash    Allergies as of 07/07/2021       Reactions   Penicillins Hives, Swelling, Rash        Medication List        Accurate as of Jul 07, 2021 10:43 AM. If you have any questions, ask your nurse or doctor.          STOP taking these medications    QUEtiapine 25 MG tablet Commonly known as: SEROQUEL Stopped by: Virgie Dad, MD       TAKE these medications    acetaminophen 325 MG tablet Commonly known as: TYLENOL Take 650 mg by mouth every 4 (four) hours as needed for mild pain.   metFORMIN 500 MG tablet Commonly known as: GLUCOPHAGE Take 500 mg by mouth 2 (two) times daily with a meal.   simvastatin 20 MG tablet Commonly known as: ZOCOR Take 20 mg by mouth 3 (three) times a week.        Review of Systems  Immunization History  Administered Date(s) Administered  . Influenza Split 12/03/2009, 11/25/2010, 03/03/2011, 11/10/2011, 12/09/2012, 11/17/2013  . Influenza, Quadrivalent, Recombinant, Inj, Pf 10/30/2017, 11/04/2018, 11/29/2019, 12/07/2020  . Influenza,inj,Quad PF,6+ Mos 11/17/2013, 11/03/2014  . Influenza-Unspecified 11/02/2015, 11/28/2016  . Moderna Covid-19 Vaccine Bivalent Booster 34yr & up 11/14/2020  . Moderna SARS-COV2 Booster Vaccination 07/16/2020  . Pneumococcal Polysaccharide-23 03/03/2011  . Pneumococcal-Unspecified 05/10/2012  . Td 03/03/2011  . Tdap 01/19/2013, 02/13/2019  . Zoster Recombinat (Shingrix) 04/10/2018  . Zoster, Live 12/03/2009, 03/03/2011, 10/05/2017   Pertinent  Health Maintenance Due  Topic Date Due  . FOOT EXAM  Never done  . OPHTHALMOLOGY EXAM  Never done  . URINE MICROALBUMIN  Never done  . INFLUENZA VACCINE  09/16/2021  . HEMOGLOBIN A1C  12/24/2021      06/25/2021    8:00 PM 06/26/2021    8:00 AM 06/26/2021    6:00 PM 06/26/2021    7:48 PM 06/27/2021    8:00 AM  Fall Risk  Patient Fall Risk Level High fall risk High fall risk High fall risk High fall risk High fall risk   Functional Status Survey:    Vitals:   07/07/21 1038  BP: (!) 149/75  Pulse: 83  Resp: 14  Temp: 98 F (36.7 C)  SpO2: 98%  Weight: 171 lb 6.4 oz (77.7 kg)  Height:  '5\' 11"'$  (1.803 m)   Body mass index is 23.91 kg/m. Physical Exam  Labs reviewed: Recent Labs    06/23/21 0255 06/23/21 0326 06/24/21 0200 06/25/21 0302  NA 135 136 136 137  K 3.9 3.9 4.0 4.7  CL 104  --  105 107  CO2 19*  --  19* 21*  GLUCOSE 224*  --  171* 196*  BUN 20  --  17 16  CREATININE 0.92  --  0.94 0.94  CALCIUM 9.2  --  8.9 9.1   Recent Labs    06/20/21 1603 06/23/21 0255  AST 11* 15  ALT 13 15  ALKPHOS 58 54  BILITOT 0.6 1.1  PROT 6.8 6.2*  ALBUMIN 4.0 3.4*   Recent Labs    06/20/21 1603 06/20/21 1908 06/23/21 0255 06/23/21 0326 06/24/21 0200 06/25/21 0302  WBC 8.6  --  11.1*  --  13.4* 9.9  NEUTROABS 6.0  --  8.4*  --   --   --   HGB 14.2   < > 14.0 13.6 13.8 13.7  HCT 42.2   < > 40.1 40.0 40.3 39.7  MCV 90.0  --  90.1  --  89.2 89.2  PLT 213  --  213  --  196 188   < > = values in this interval not displayed.   Lab Results  Component Value Date   TSH 1.555 06/23/2021   Lab Results  Component Value Date   HGBA1C 8.5 (H) 06/23/2021   No results found for: CHOL, HDL, LDLCALC, LDLDIRECT, TRIG, CHOLHDL  Significant Diagnostic Results in last 30 days:  CT Head Wo Contrast  Result Date: 06/23/2021 CLINICAL DATA:  Fall.  Head trauma, minor (Age >= 65y) Fall EXAM: CT HEAD WITHOUT CONTRAST TECHNIQUE: Contiguous axial images were obtained from the base of the skull through the vertex without intravenous contrast. RADIATION DOSE REDUCTION: This exam was performed according to the departmental dose-optimization program which includes automated exposure control, adjustment of the mA and/or kV according to patient size and/or use of iterative reconstruction technique. COMPARISON:  06/20/2021 FINDINGS: Brain: No acute intracranial abnormality. Specifically, no hemorrhage, hydrocephalus, mass lesion, acute infarction, or significant intracranial injury. Vascular: No hyperdense vessel or unexpected calcification. Skull: No acute calvarial abnormality.  Sinuses/Orbits: No acute findings Other: None IMPRESSION: No acute intracranial abnormality. Electronically Signed   By: Rolm Baptise M.D.   On: 06/23/2021 03:29   CT Head Wo Contrast  Result Date: 06/20/2021 CLINICAL DATA:  Psychosis. EXAM: CT HEAD WITHOUT CONTRAST TECHNIQUE: Contiguous axial images were obtained from the base of the skull through the vertex without intravenous contrast. RADIATION DOSE REDUCTION: This exam was performed according to the departmental dose-optimization program which includes automated exposure control, adjustment of the  mA and/or kV according to patient size and/or use of iterative reconstruction technique. COMPARISON:  None Available. FINDINGS: Brain: Normal cerebral/cerebellar volume for age. Mild for age low density in the periventricular white matter likely related to small vessel disease. No mass lesion, hemorrhage, hydrocephalus, acute infarct, intra-axial, or extra-axial fluid collection. Vascular: No hyperdense vessel or unexpected calcification. Skull: No skull fracture. Sinuses/Orbits: Normal imaged portions of the orbits and globes. Clear paranasal sinuses and mastoid air cells. Cerumen in the left external ear canal. Other: None. IMPRESSION: No acute intracranial abnormality. Relatively mild for age cerebral/cerebellar atrophy and periventricular white matter small vessel ischemic change. Electronically Signed   By: Abigail Miyamoto M.D.   On: 06/20/2021 15:54   DG Chest Port 1 View  Result Date: 06/23/2021 CLINICAL DATA:  Fall with shoulder pain. EXAM: LEFT SHOULDER - 2+ VIEW; PORTABLE CHEST - 1 VIEW COMPARISON:  PA Lat chest 07/17/2013.  No prior shoulder series. FINDINGS: Chest: The lungs are expiratory. There are opacities in the base of the lungs which could be atelectasis or consolidation. No pleural effusion is seen. There is no pneumothorax. There are numerous overlying monitor wires. The lungs are otherwise clear. The cardiac size is normal. There is aortic  uncoiling, stable mediastinum accounting for exploration. Aortic atherosclerosis. Thoracic cage is grossly intact. Mild osteopenia. Thoracic spondylosis. Left shoulder: There is no two-view evidence of fractures. There is mild osteopenia. There is moderate osteophytosis at the glenohumeral joint and AC joint. Severe joint space loss is noted at the glenohumeral joint. There are calcifications adjacent the posterior humeral cuff insertion which could be due to calcific tendinitis or tendinopathy. Surrounding soft tissues are otherwise unremarkable. Left upper lung field is clear. IMPRESSION: 1. Expiratory chest film with bibasilar atelectasis or consolidation. 2. Osteopenia and degenerative change without evidence of fractures at the left shoulder. 3. Calcifications of the posterior edge of the humeral head which could be due to calcific infraspinatus/teres minor tendinitis or chronic calcific tendinopathy. Electronically Signed   By: Telford Nab M.D.   On: 06/23/2021 03:31   DG Shoulder Left  Result Date: 06/23/2021 CLINICAL DATA:  Fall with shoulder pain. EXAM: LEFT SHOULDER - 2+ VIEW; PORTABLE CHEST - 1 VIEW COMPARISON:  PA Lat chest 07/17/2013.  No prior shoulder series. FINDINGS: Chest: The lungs are expiratory. There are opacities in the base of the lungs which could be atelectasis or consolidation. No pleural effusion is seen. There is no pneumothorax. There are numerous overlying monitor wires. The lungs are otherwise clear. The cardiac size is normal. There is aortic uncoiling, stable mediastinum accounting for exploration. Aortic atherosclerosis. Thoracic cage is grossly intact. Mild osteopenia. Thoracic spondylosis. Left shoulder: There is no two-view evidence of fractures. There is mild osteopenia. There is moderate osteophytosis at the glenohumeral joint and AC joint. Severe joint space loss is noted at the glenohumeral joint. There are calcifications adjacent the posterior humeral cuff insertion  which could be due to calcific tendinitis or tendinopathy. Surrounding soft tissues are otherwise unremarkable. Left upper lung field is clear. IMPRESSION: 1. Expiratory chest film with bibasilar atelectasis or consolidation. 2. Osteopenia and degenerative change without evidence of fractures at the left shoulder. 3. Calcifications of the posterior edge of the humeral head which could be due to calcific infraspinatus/teres minor tendinitis or chronic calcific tendinopathy. Electronically Signed   By: Telford Nab M.D.   On: 06/23/2021 03:31   EEG adult  Result Date: 06/23/2021 Lora Havens, MD     06/23/2021  5:58  PM Patient Name: JAVYON FONTAN MRN: [id number] Epilepsy Attending: Lora Havens Referring Physician/Provider: Tawni Millers, MD Date: 06/23/2021 Duration: 21.10 mins Patient history: 88yo M with ams. EEG to evaluate for seizure. Level of alertness: Awake, asleep AEDs during EEG study: None Technical aspects: This EEG study was done with scalp electrodes positioned according to the 10-20 International system of electrode placement. Electrical activity was acquired at a sampling rate of '500Hz'$  and reviewed with a high frequency filter of '70Hz'$  and a low frequency filter of '1Hz'$ . EEG data were recorded continuously and digitally stored. Description: The posterior dominant rhythm consists of  8-9 Hz activity of moderate voltage (25-35 uV) seen predominantly in posterior head regions, symmetric and reactive to eye opening and eye closing. Sleep was characterized by vertex waves, sleep spindles (12 to 14 Hz), maximal frontocentral region.  EEG showed intermittent generalized 3 to 6 Hz theta-delta slowing. Hyperventilation and photic stimulation were not performed.   Of note, study was technically difficult due to significant myogenic artifact. ABNORMALITY - Intermittent slow, generalized IMPRESSION: This technically difficult study is suggestive of mild diffuse encephalopathy, nonspecific  etiology. No seizures or epileptiform discharges were seen throughout the recording. Priyanka Barbra Sarks    Assessment/Plan 1. Acute encephalopathy Resolved Now Off Gabapentin and prednisone Cognitively back to baseline   2. Uncontrolled type 2 diabetes mellitus with hyperglycemia ([place]) CBGs in Afternoon have been high But patient says it is due ti him not watching his diet He wants to wait before changing his Metformin dose  3. Right leg weakness D/W the therapy Continue sot have weakness of the leg CT head was negative Continue to work with therapy for now Patient able to   4. Complex sleep apnea syndrome Mask fitness continue sto be issue  5. Hyperlipidemia, unspecified hyperlipidemia type *** 6 Low Back pain Resolved for now Cancel Steroid infusion  Family/ staff Communication:   Labs/tests ordered:

## 2021-07-08 DIAGNOSIS — G9349 Other encephalopathy: Secondary | ICD-10-CM | POA: Diagnosis not present

## 2021-07-08 DIAGNOSIS — G14 Postpolio syndrome: Secondary | ICD-10-CM | POA: Diagnosis not present

## 2021-07-08 DIAGNOSIS — R601 Generalized edema: Secondary | ICD-10-CM | POA: Diagnosis not present

## 2021-07-08 DIAGNOSIS — E1165 Type 2 diabetes mellitus with hyperglycemia: Secondary | ICD-10-CM | POA: Diagnosis not present

## 2021-07-08 DIAGNOSIS — M62561 Muscle wasting and atrophy, not elsewhere classified, right lower leg: Secondary | ICD-10-CM | POA: Diagnosis not present

## 2021-07-08 DIAGNOSIS — M62562 Muscle wasting and atrophy, not elsewhere classified, left lower leg: Secondary | ICD-10-CM | POA: Diagnosis not present

## 2021-07-08 DIAGNOSIS — R278 Other lack of coordination: Secondary | ICD-10-CM | POA: Diagnosis not present

## 2021-07-08 DIAGNOSIS — M6389 Disorders of muscle in diseases classified elsewhere, multiple sites: Secondary | ICD-10-CM | POA: Diagnosis not present

## 2021-07-08 DIAGNOSIS — G9341 Metabolic encephalopathy: Secondary | ICD-10-CM | POA: Diagnosis not present

## 2021-07-08 DIAGNOSIS — R2689 Other abnormalities of gait and mobility: Secondary | ICD-10-CM | POA: Diagnosis not present

## 2021-07-08 DIAGNOSIS — G4739 Other sleep apnea: Secondary | ICD-10-CM | POA: Diagnosis not present

## 2021-07-08 DIAGNOSIS — R41840 Attention and concentration deficit: Secondary | ICD-10-CM | POA: Diagnosis not present

## 2021-07-09 DIAGNOSIS — E1165 Type 2 diabetes mellitus with hyperglycemia: Secondary | ICD-10-CM | POA: Diagnosis not present

## 2021-07-09 DIAGNOSIS — M62561 Muscle wasting and atrophy, not elsewhere classified, right lower leg: Secondary | ICD-10-CM | POA: Diagnosis not present

## 2021-07-09 DIAGNOSIS — R278 Other lack of coordination: Secondary | ICD-10-CM | POA: Diagnosis not present

## 2021-07-09 DIAGNOSIS — M6389 Disorders of muscle in diseases classified elsewhere, multiple sites: Secondary | ICD-10-CM | POA: Diagnosis not present

## 2021-07-09 DIAGNOSIS — G9341 Metabolic encephalopathy: Secondary | ICD-10-CM | POA: Diagnosis not present

## 2021-07-09 DIAGNOSIS — G4739 Other sleep apnea: Secondary | ICD-10-CM | POA: Diagnosis not present

## 2021-07-09 DIAGNOSIS — G14 Postpolio syndrome: Secondary | ICD-10-CM | POA: Diagnosis not present

## 2021-07-09 DIAGNOSIS — G9349 Other encephalopathy: Secondary | ICD-10-CM | POA: Diagnosis not present

## 2021-07-09 DIAGNOSIS — R2689 Other abnormalities of gait and mobility: Secondary | ICD-10-CM | POA: Diagnosis not present

## 2021-07-09 DIAGNOSIS — M62562 Muscle wasting and atrophy, not elsewhere classified, left lower leg: Secondary | ICD-10-CM | POA: Diagnosis not present

## 2021-07-09 DIAGNOSIS — R601 Generalized edema: Secondary | ICD-10-CM | POA: Diagnosis not present

## 2021-07-09 DIAGNOSIS — R41840 Attention and concentration deficit: Secondary | ICD-10-CM | POA: Diagnosis not present

## 2021-07-10 DIAGNOSIS — R41840 Attention and concentration deficit: Secondary | ICD-10-CM | POA: Diagnosis not present

## 2021-07-10 DIAGNOSIS — M62561 Muscle wasting and atrophy, not elsewhere classified, right lower leg: Secondary | ICD-10-CM | POA: Diagnosis not present

## 2021-07-10 DIAGNOSIS — R2689 Other abnormalities of gait and mobility: Secondary | ICD-10-CM | POA: Diagnosis not present

## 2021-07-10 DIAGNOSIS — M6389 Disorders of muscle in diseases classified elsewhere, multiple sites: Secondary | ICD-10-CM | POA: Diagnosis not present

## 2021-07-10 DIAGNOSIS — R601 Generalized edema: Secondary | ICD-10-CM | POA: Diagnosis not present

## 2021-07-10 DIAGNOSIS — M62562 Muscle wasting and atrophy, not elsewhere classified, left lower leg: Secondary | ICD-10-CM | POA: Diagnosis not present

## 2021-07-10 DIAGNOSIS — G9349 Other encephalopathy: Secondary | ICD-10-CM | POA: Diagnosis not present

## 2021-07-10 DIAGNOSIS — G9341 Metabolic encephalopathy: Secondary | ICD-10-CM | POA: Diagnosis not present

## 2021-07-10 DIAGNOSIS — G4739 Other sleep apnea: Secondary | ICD-10-CM | POA: Diagnosis not present

## 2021-07-10 DIAGNOSIS — G14 Postpolio syndrome: Secondary | ICD-10-CM | POA: Diagnosis not present

## 2021-07-10 DIAGNOSIS — E1165 Type 2 diabetes mellitus with hyperglycemia: Secondary | ICD-10-CM | POA: Diagnosis not present

## 2021-07-10 DIAGNOSIS — R278 Other lack of coordination: Secondary | ICD-10-CM | POA: Diagnosis not present

## 2021-07-11 DIAGNOSIS — R41840 Attention and concentration deficit: Secondary | ICD-10-CM | POA: Diagnosis not present

## 2021-07-11 DIAGNOSIS — M62562 Muscle wasting and atrophy, not elsewhere classified, left lower leg: Secondary | ICD-10-CM | POA: Diagnosis not present

## 2021-07-11 DIAGNOSIS — G9341 Metabolic encephalopathy: Secondary | ICD-10-CM | POA: Diagnosis not present

## 2021-07-11 DIAGNOSIS — M62561 Muscle wasting and atrophy, not elsewhere classified, right lower leg: Secondary | ICD-10-CM | POA: Diagnosis not present

## 2021-07-11 DIAGNOSIS — G9349 Other encephalopathy: Secondary | ICD-10-CM | POA: Diagnosis not present

## 2021-07-11 DIAGNOSIS — R2689 Other abnormalities of gait and mobility: Secondary | ICD-10-CM | POA: Diagnosis not present

## 2021-07-11 DIAGNOSIS — M6389 Disorders of muscle in diseases classified elsewhere, multiple sites: Secondary | ICD-10-CM | POA: Diagnosis not present

## 2021-07-11 DIAGNOSIS — G14 Postpolio syndrome: Secondary | ICD-10-CM | POA: Diagnosis not present

## 2021-07-11 DIAGNOSIS — E1165 Type 2 diabetes mellitus with hyperglycemia: Secondary | ICD-10-CM | POA: Diagnosis not present

## 2021-07-11 DIAGNOSIS — R601 Generalized edema: Secondary | ICD-10-CM | POA: Diagnosis not present

## 2021-07-11 DIAGNOSIS — R278 Other lack of coordination: Secondary | ICD-10-CM | POA: Diagnosis not present

## 2021-07-11 DIAGNOSIS — G4739 Other sleep apnea: Secondary | ICD-10-CM | POA: Diagnosis not present

## 2021-07-14 DIAGNOSIS — R2689 Other abnormalities of gait and mobility: Secondary | ICD-10-CM | POA: Diagnosis not present

## 2021-07-14 DIAGNOSIS — E1165 Type 2 diabetes mellitus with hyperglycemia: Secondary | ICD-10-CM | POA: Diagnosis not present

## 2021-07-14 DIAGNOSIS — G9341 Metabolic encephalopathy: Secondary | ICD-10-CM | POA: Diagnosis not present

## 2021-07-14 DIAGNOSIS — M62561 Muscle wasting and atrophy, not elsewhere classified, right lower leg: Secondary | ICD-10-CM | POA: Diagnosis not present

## 2021-07-14 DIAGNOSIS — M62562 Muscle wasting and atrophy, not elsewhere classified, left lower leg: Secondary | ICD-10-CM | POA: Diagnosis not present

## 2021-07-14 DIAGNOSIS — G14 Postpolio syndrome: Secondary | ICD-10-CM | POA: Diagnosis not present

## 2021-07-14 DIAGNOSIS — R278 Other lack of coordination: Secondary | ICD-10-CM | POA: Diagnosis not present

## 2021-07-14 DIAGNOSIS — G4739 Other sleep apnea: Secondary | ICD-10-CM | POA: Diagnosis not present

## 2021-07-15 ENCOUNTER — Encounter (HOSPITAL_COMMUNITY): Payer: Medicare PPO

## 2021-07-15 DIAGNOSIS — M62561 Muscle wasting and atrophy, not elsewhere classified, right lower leg: Secondary | ICD-10-CM | POA: Diagnosis not present

## 2021-07-15 DIAGNOSIS — M6389 Disorders of muscle in diseases classified elsewhere, multiple sites: Secondary | ICD-10-CM | POA: Diagnosis not present

## 2021-07-15 DIAGNOSIS — R601 Generalized edema: Secondary | ICD-10-CM | POA: Diagnosis not present

## 2021-07-15 DIAGNOSIS — G4739 Other sleep apnea: Secondary | ICD-10-CM | POA: Diagnosis not present

## 2021-07-15 DIAGNOSIS — R2689 Other abnormalities of gait and mobility: Secondary | ICD-10-CM | POA: Diagnosis not present

## 2021-07-15 DIAGNOSIS — G9349 Other encephalopathy: Secondary | ICD-10-CM | POA: Diagnosis not present

## 2021-07-15 DIAGNOSIS — G9341 Metabolic encephalopathy: Secondary | ICD-10-CM | POA: Diagnosis not present

## 2021-07-15 DIAGNOSIS — M62562 Muscle wasting and atrophy, not elsewhere classified, left lower leg: Secondary | ICD-10-CM | POA: Diagnosis not present

## 2021-07-15 DIAGNOSIS — R278 Other lack of coordination: Secondary | ICD-10-CM | POA: Diagnosis not present

## 2021-07-15 DIAGNOSIS — R41840 Attention and concentration deficit: Secondary | ICD-10-CM | POA: Diagnosis not present

## 2021-07-15 DIAGNOSIS — G14 Postpolio syndrome: Secondary | ICD-10-CM | POA: Diagnosis not present

## 2021-07-15 DIAGNOSIS — E1165 Type 2 diabetes mellitus with hyperglycemia: Secondary | ICD-10-CM | POA: Diagnosis not present

## 2021-07-16 DIAGNOSIS — R601 Generalized edema: Secondary | ICD-10-CM | POA: Diagnosis not present

## 2021-07-16 DIAGNOSIS — M6389 Disorders of muscle in diseases classified elsewhere, multiple sites: Secondary | ICD-10-CM | POA: Diagnosis not present

## 2021-07-16 DIAGNOSIS — R278 Other lack of coordination: Secondary | ICD-10-CM | POA: Diagnosis not present

## 2021-07-16 DIAGNOSIS — M62561 Muscle wasting and atrophy, not elsewhere classified, right lower leg: Secondary | ICD-10-CM | POA: Diagnosis not present

## 2021-07-16 DIAGNOSIS — G4739 Other sleep apnea: Secondary | ICD-10-CM | POA: Diagnosis not present

## 2021-07-16 DIAGNOSIS — R41840 Attention and concentration deficit: Secondary | ICD-10-CM | POA: Diagnosis not present

## 2021-07-16 DIAGNOSIS — G14 Postpolio syndrome: Secondary | ICD-10-CM | POA: Diagnosis not present

## 2021-07-16 DIAGNOSIS — R2689 Other abnormalities of gait and mobility: Secondary | ICD-10-CM | POA: Diagnosis not present

## 2021-07-16 DIAGNOSIS — G9341 Metabolic encephalopathy: Secondary | ICD-10-CM | POA: Diagnosis not present

## 2021-07-16 DIAGNOSIS — E1165 Type 2 diabetes mellitus with hyperglycemia: Secondary | ICD-10-CM | POA: Diagnosis not present

## 2021-07-16 DIAGNOSIS — M62562 Muscle wasting and atrophy, not elsewhere classified, left lower leg: Secondary | ICD-10-CM | POA: Diagnosis not present

## 2021-07-16 DIAGNOSIS — G9349 Other encephalopathy: Secondary | ICD-10-CM | POA: Diagnosis not present

## 2021-07-17 ENCOUNTER — Encounter: Payer: Self-pay | Admitting: Adult Health

## 2021-07-17 DIAGNOSIS — R2689 Other abnormalities of gait and mobility: Secondary | ICD-10-CM | POA: Diagnosis not present

## 2021-07-17 DIAGNOSIS — G9349 Other encephalopathy: Secondary | ICD-10-CM | POA: Diagnosis not present

## 2021-07-17 DIAGNOSIS — R601 Generalized edema: Secondary | ICD-10-CM | POA: Diagnosis not present

## 2021-07-17 DIAGNOSIS — M62561 Muscle wasting and atrophy, not elsewhere classified, right lower leg: Secondary | ICD-10-CM | POA: Diagnosis not present

## 2021-07-17 DIAGNOSIS — M62562 Muscle wasting and atrophy, not elsewhere classified, left lower leg: Secondary | ICD-10-CM | POA: Diagnosis not present

## 2021-07-17 DIAGNOSIS — G4739 Other sleep apnea: Secondary | ICD-10-CM | POA: Diagnosis not present

## 2021-07-17 DIAGNOSIS — R41840 Attention and concentration deficit: Secondary | ICD-10-CM | POA: Diagnosis not present

## 2021-07-17 DIAGNOSIS — G9341 Metabolic encephalopathy: Secondary | ICD-10-CM | POA: Diagnosis not present

## 2021-07-17 DIAGNOSIS — E1165 Type 2 diabetes mellitus with hyperglycemia: Secondary | ICD-10-CM | POA: Diagnosis not present

## 2021-07-17 DIAGNOSIS — R278 Other lack of coordination: Secondary | ICD-10-CM | POA: Diagnosis not present

## 2021-07-17 DIAGNOSIS — G14 Postpolio syndrome: Secondary | ICD-10-CM | POA: Diagnosis not present

## 2021-07-17 DIAGNOSIS — M6389 Disorders of muscle in diseases classified elsewhere, multiple sites: Secondary | ICD-10-CM | POA: Diagnosis not present

## 2021-07-17 NOTE — Progress Notes (Unsigned)
Location:  [facility] Room Number: 152-A Place of Service:  SNF (31)  Provider: Royal Hawthorn, NP   PCP: Crist Infante, MD Patient Care Team: Crist Infante, MD as PCP - General (Internal Medicine)  Extended Emergency Contact Information Primary Emergency Contact: Denzil Hughes Address: [address]          [place]  [place] Home Phone: [phone] Mobile Phone: [phone] Relation: Spouse Secondary Emergency Contact: [place] Mobile Phone: [phone] Relation: Daughter  Code Status: DNR Goals of care:  Advanced Directive information    07/07/2021   10:41 AM  Advanced Directives  Does Patient Have a Medical Advance Directive? No  Type of Advance Directive Living will  Does patient want to make changes to medical advance directive? No - Patient declined     Allergies  Allergen Reactions   Penicillins Hives, Swelling and Rash    Chief Complaint  Patient presents with   Discharge Note    Discharge from [facility]     HPI:  88 y.o. male      Past Medical History:  Diagnosis Date   Adenomatous colon polyp    Allergic rhinitis    Arthritis    GERD (gastroesophageal reflux disease)    Hyperlipidemia    Polio 1939   Post-polio syndrome    Prostate cancer ([place])    Skin cancer (melanoma) ([place])    Right eye    Past Surgical History:  Procedure Laterality Date   cataract surgery     bilateral   LUMBAR EPIDURAL INJECTION     LUMBAR LAMINECTOMY/DECOMPRESSION MICRODISCECTOMY Right 07/19/2013   Procedure: LUMBAR LAMINECTOMY/DECOMPRESSION MICRODISCECTOMY 2 LEVELS     lumbar  three/four,  four/five;  Surgeon: Eustace Moore, MD;  Location: [facility];  Service: Neurosurgery;  Laterality: Right;   PROSTATECTOMY     sigmoid colectomy     TONSILLECTOMY        reports that he has been smoking cigars. He has never used smokeless tobacco. He reports current alcohol use. He reports that he does not use drugs. Social  History   Socioeconomic History   Marital status: Married    Spouse name: Not on file   Number of children: 1   Years of education: Not on file   Highest education level: Not on file  Occupational History   Occupation: retired  Tobacco Use   Smoking status: Some Days    Types: Cigars   Smokeless tobacco: Never   Tobacco comments:    has occasional cigar ;stopped cigarettes in 1965  Vaping Use   Vaping Use: Never used  Substance and Sexual Activity   Alcohol use: Yes    Alcohol/week: 0.0 standard drinks    Comment: 2 oz scotch, 4 oz wine   Drug use: No   Sexual activity: Not on file  Other Topics Concern   Not on file  Social History Narrative   Not on file   Social Determinants of Health   Financial Resource Strain: Not on file  Food Insecurity: Not on file  Transportation Needs: Not on file  Physical Activity: Not on file  Stress: Not on file  Social Connections: Not on file  Intimate Partner Violence: Not on file   Functional Status Survey:    Allergies  Allergen Reactions   Penicillins Hives, Swelling and Rash    Pertinent  Health Maintenance Due  Topic Date Due   FOOT EXAM  Never done   OPHTHALMOLOGY EXAM  Never done  URINE MICROALBUMIN  Never done   INFLUENZA VACCINE  09/16/2021   HEMOGLOBIN A1C  12/24/2021    Medications: Outpatient Encounter Medications as of 07/17/2021  Medication Sig   acetaminophen (TYLENOL) 325 MG tablet Take 650 mg by mouth every 4 (four) hours as needed for mild pain.   metFORMIN (GLUCOPHAGE) 500 MG tablet Take 500 mg by mouth 2 (two) times daily with a meal.   polyethylene glycol (MIRALAX / GLYCOLAX) 17 g packet Take 17 g by mouth daily as needed.   saccharomyces boulardii (FLORASTOR) 250 MG capsule Take 250 mg by mouth daily at 12 noon.   senna (SENOKOT) 8.6 MG tablet Take 2 tablets by mouth at bedtime as needed for constipation.   simvastatin (ZOCOR) 20 MG tablet Take 20 mg by mouth 3 (three) times a week.   UNABLE TO  FIND Med Name: BiPap Machine   No facility-administered encounter medications on file as of 07/17/2021.    Review of Systems  Vitals:   07/17/21 1037  BP: 139/83  Pulse: 83  Resp: 19  Temp: 97.7 F (36.5 C)  SpO2: 97%  Weight: 173 lb 6.4 oz (78.7 kg)  Height: '5\' 11"'$  (1.803 m)   Body mass index is 24.18 kg/m. Physical Exam  Labs reviewed: Basic Metabolic Panel: Recent Labs    06/23/21 0255 06/23/21 0326 06/24/21 0200 06/25/21 0302  NA 135 136 136 137  K 3.9 3.9 4.0 4.7  CL 104  --  105 107  CO2 19*  --  19* 21*  GLUCOSE 224*  --  171* 196*  BUN 20  --  17 16  CREATININE 0.92  --  0.94 0.94  CALCIUM 9.2  --  8.9 9.1   Liver Function Tests: Recent Labs    06/20/21 1603 06/23/21 0255  AST 11* 15  ALT 13 15  ALKPHOS 58 54  BILITOT 0.6 1.1  PROT 6.8 6.2*  ALBUMIN 4.0 3.4*   No results for input(s): LIPASE, AMYLASE in the last 8760 hours. Recent Labs    06/20/21 1710 06/23/21 0645  AMMONIA 19 20   CBC: Recent Labs    06/20/21 1603 06/20/21 1908 06/23/21 0255 06/23/21 0326 06/24/21 0200 06/25/21 0302  WBC 8.6  --  11.1*  --  13.4* 9.9  NEUTROABS 6.0  --  8.4*  --   --   --   HGB 14.2   < > 14.0 13.6 13.8 13.7  HCT 42.2   < > 40.1 40.0 40.3 39.7  MCV 90.0  --  90.1  --  89.2 89.2  PLT 213  --  213  --  196 188   < > = values in this interval not displayed.   Cardiac Enzymes: No results for input(s): CKTOTAL, CKMB, CKMBINDEX, TROPONINI in the last 8760 hours. BNP: Invalid input(s): POCBNP CBG: Recent Labs    06/26/21 2053 06/27/21 0613 06/27/21 1129  GLUCAP 223* 127* 179*    Procedures and Imaging Studies During Stay: CT Head Wo Contrast  Result Date: 06/23/2021 CLINICAL DATA:  Fall.  Head trauma, minor (Age >= 65y) Fall EXAM: CT HEAD WITHOUT CONTRAST TECHNIQUE: Contiguous axial images were obtained from the base of the skull through the vertex without intravenous contrast. RADIATION DOSE REDUCTION: This exam was performed according to the  departmental dose-optimization program which includes automated exposure control, adjustment of the mA and/or kV according to patient size and/or use of iterative reconstruction technique. COMPARISON:  06/20/2021 FINDINGS: Brain: No acute intracranial abnormality. Specifically, no hemorrhage,  hydrocephalus, mass lesion, acute infarction, or significant intracranial injury. Vascular: No hyperdense vessel or unexpected calcification. Skull: No acute calvarial abnormality. Sinuses/Orbits: No acute findings Other: None IMPRESSION: No acute intracranial abnormality. Electronically Signed   By: Rolm Baptise M.D.   On: 06/23/2021 03:29   CT Head Wo Contrast  Result Date: 06/20/2021 CLINICAL DATA:  Psychosis. EXAM: CT HEAD WITHOUT CONTRAST TECHNIQUE: Contiguous axial images were obtained from the base of the skull through the vertex without intravenous contrast. RADIATION DOSE REDUCTION: This exam was performed according to the departmental dose-optimization program which includes automated exposure control, adjustment of the mA and/or kV according to patient size and/or use of iterative reconstruction technique. COMPARISON:  None Available. FINDINGS: Brain: Normal cerebral/cerebellar volume for age. Mild for age low density in the periventricular white matter likely related to small vessel disease. No mass lesion, hemorrhage, hydrocephalus, acute infarct, intra-axial, or extra-axial fluid collection. Vascular: No hyperdense vessel or unexpected calcification. Skull: No skull fracture. Sinuses/Orbits: Normal imaged portions of the orbits and globes. Clear paranasal sinuses and mastoid air cells. Cerumen in the left external ear canal. Other: None. IMPRESSION: No acute intracranial abnormality. Relatively mild for age cerebral/cerebellar atrophy and periventricular white matter small vessel ischemic change. Electronically Signed   By: Abigail Miyamoto M.D.   On: 06/20/2021 15:54   DG Chest Port 1 View  Result Date:  06/23/2021 CLINICAL DATA:  Fall with shoulder pain. EXAM: LEFT SHOULDER - 2+ VIEW; PORTABLE CHEST - 1 VIEW COMPARISON:  PA Lat chest 07/17/2013.  No prior shoulder series. FINDINGS: Chest: The lungs are expiratory. There are opacities in the base of the lungs which could be atelectasis or consolidation. No pleural effusion is seen. There is no pneumothorax. There are numerous overlying monitor wires. The lungs are otherwise clear. The cardiac size is normal. There is aortic uncoiling, stable mediastinum accounting for exploration. Aortic atherosclerosis. Thoracic cage is grossly intact. Mild osteopenia. Thoracic spondylosis. Left shoulder: There is no two-view evidence of fractures. There is mild osteopenia. There is moderate osteophytosis at the glenohumeral joint and AC joint. Severe joint space loss is noted at the glenohumeral joint. There are calcifications adjacent the posterior humeral cuff insertion which could be due to calcific tendinitis or tendinopathy. Surrounding soft tissues are otherwise unremarkable. Left upper lung field is clear. IMPRESSION: 1. Expiratory chest film with bibasilar atelectasis or consolidation. 2. Osteopenia and degenerative change without evidence of fractures at the left shoulder. 3. Calcifications of the posterior edge of the humeral head which could be due to calcific infraspinatus/teres minor tendinitis or chronic calcific tendinopathy. Electronically Signed   By: Telford Nab M.D.   On: 06/23/2021 03:31   DG Shoulder Left  Result Date: 06/23/2021 CLINICAL DATA:  Fall with shoulder pain. EXAM: LEFT SHOULDER - 2+ VIEW; PORTABLE CHEST - 1 VIEW COMPARISON:  PA Lat chest 07/17/2013.  No prior shoulder series. FINDINGS: Chest: The lungs are expiratory. There are opacities in the base of the lungs which could be atelectasis or consolidation. No pleural effusion is seen. There is no pneumothorax. There are numerous overlying monitor wires. The lungs are otherwise clear. The  cardiac size is normal. There is aortic uncoiling, stable mediastinum accounting for exploration. Aortic atherosclerosis. Thoracic cage is grossly intact. Mild osteopenia. Thoracic spondylosis. Left shoulder: There is no two-view evidence of fractures. There is mild osteopenia. There is moderate osteophytosis at the glenohumeral joint and AC joint. Severe joint space loss is noted at the glenohumeral joint. There are calcifications adjacent the  posterior humeral cuff insertion which could be due to calcific tendinitis or tendinopathy. Surrounding soft tissues are otherwise unremarkable. Left upper lung field is clear. IMPRESSION: 1. Expiratory chest film with bibasilar atelectasis or consolidation. 2. Osteopenia and degenerative change without evidence of fractures at the left shoulder. 3. Calcifications of the posterior edge of the humeral head which could be due to calcific infraspinatus/teres minor tendinitis or chronic calcific tendinopathy. Electronically Signed   By: Telford Nab M.D.   On: 06/23/2021 03:31   EEG adult  Result Date: 06/23/2021 Lora Havens, MD     06/23/2021  5:58 PM Patient Name: Eddie Sanders MRN: [id number] Epilepsy Attending: Lora Havens Referring Physician/Provider: Tawni Millers, MD Date: 06/23/2021 Duration: 21.10 mins Patient history: 88yo M with ams. EEG to evaluate for seizure. Level of alertness: Awake, asleep AEDs during EEG study: None Technical aspects: This EEG study was done with scalp electrodes positioned according to the 10-20 International system of electrode placement. Electrical activity was acquired at a sampling rate of '500Hz'$  and reviewed with a high frequency filter of '70Hz'$  and a low frequency filter of '1Hz'$ . EEG data were recorded continuously and digitally stored. Description: The posterior dominant rhythm consists of  8-9 Hz activity of moderate voltage (25-35 uV) seen predominantly in posterior head regions, symmetric and reactive to eye  opening and eye closing. Sleep was characterized by vertex waves, sleep spindles (12 to 14 Hz), maximal frontocentral region.  EEG showed intermittent generalized 3 to 6 Hz theta-delta slowing. Hyperventilation and photic stimulation were not performed.   Of note, study was technically difficult due to significant myogenic artifact. ABNORMALITY - Intermittent slow, generalized IMPRESSION: This technically difficult study is suggestive of mild diffuse encephalopathy, nonspecific etiology. No seizures or epileptiform discharges were seen throughout the recording. Priyanka Barbra Sarks    Assessment/Plan:   There are no diagnoses linked to this encounter.   Patient is being discharged with the following home health services:    Patient is being discharged with the following durable medical equipment:    Patient has been advised to f/u with their PCP in 1-2 weeks to for a transitions of care visit.  Social services at their facility was responsible for arranging this appointment.  Pt was provided with adequate prescriptions of noncontrolled medications to reach the scheduled appointment .  For controlled substances, a limited supply was provided as appropriate for the individual patient.  If the pt normally receives these medications from a pain clinic or has a contract with another physician, these medications should be received from that clinic or physician only).    Future labs/tests needed:  ***

## 2021-07-18 DIAGNOSIS — R278 Other lack of coordination: Secondary | ICD-10-CM | POA: Diagnosis not present

## 2021-07-18 DIAGNOSIS — G9341 Metabolic encephalopathy: Secondary | ICD-10-CM | POA: Diagnosis not present

## 2021-07-18 DIAGNOSIS — G9349 Other encephalopathy: Secondary | ICD-10-CM | POA: Diagnosis not present

## 2021-07-18 DIAGNOSIS — M6389 Disorders of muscle in diseases classified elsewhere, multiple sites: Secondary | ICD-10-CM | POA: Diagnosis not present

## 2021-07-18 DIAGNOSIS — M62561 Muscle wasting and atrophy, not elsewhere classified, right lower leg: Secondary | ICD-10-CM | POA: Diagnosis not present

## 2021-07-18 DIAGNOSIS — E1165 Type 2 diabetes mellitus with hyperglycemia: Secondary | ICD-10-CM | POA: Diagnosis not present

## 2021-07-18 DIAGNOSIS — R2689 Other abnormalities of gait and mobility: Secondary | ICD-10-CM | POA: Diagnosis not present

## 2021-07-18 DIAGNOSIS — M62562 Muscle wasting and atrophy, not elsewhere classified, left lower leg: Secondary | ICD-10-CM | POA: Diagnosis not present

## 2021-07-18 DIAGNOSIS — G14 Postpolio syndrome: Secondary | ICD-10-CM | POA: Diagnosis not present

## 2021-07-18 DIAGNOSIS — R41840 Attention and concentration deficit: Secondary | ICD-10-CM | POA: Diagnosis not present

## 2021-07-18 DIAGNOSIS — R601 Generalized edema: Secondary | ICD-10-CM | POA: Diagnosis not present

## 2021-07-18 DIAGNOSIS — G4739 Other sleep apnea: Secondary | ICD-10-CM | POA: Diagnosis not present

## 2021-07-18 NOTE — Progress Notes (Signed)
This encounter was created in error - please disregard.

## 2021-07-21 DIAGNOSIS — G4739 Other sleep apnea: Secondary | ICD-10-CM | POA: Diagnosis not present

## 2021-07-21 DIAGNOSIS — M62562 Muscle wasting and atrophy, not elsewhere classified, left lower leg: Secondary | ICD-10-CM | POA: Diagnosis not present

## 2021-07-21 DIAGNOSIS — G9349 Other encephalopathy: Secondary | ICD-10-CM | POA: Diagnosis not present

## 2021-07-21 DIAGNOSIS — M62561 Muscle wasting and atrophy, not elsewhere classified, right lower leg: Secondary | ICD-10-CM | POA: Diagnosis not present

## 2021-07-21 DIAGNOSIS — R41840 Attention and concentration deficit: Secondary | ICD-10-CM | POA: Diagnosis not present

## 2021-07-21 DIAGNOSIS — R601 Generalized edema: Secondary | ICD-10-CM | POA: Diagnosis not present

## 2021-07-21 DIAGNOSIS — G9341 Metabolic encephalopathy: Secondary | ICD-10-CM | POA: Diagnosis not present

## 2021-07-21 DIAGNOSIS — E1165 Type 2 diabetes mellitus with hyperglycemia: Secondary | ICD-10-CM | POA: Diagnosis not present

## 2021-07-21 DIAGNOSIS — M6389 Disorders of muscle in diseases classified elsewhere, multiple sites: Secondary | ICD-10-CM | POA: Diagnosis not present

## 2021-07-21 DIAGNOSIS — R2689 Other abnormalities of gait and mobility: Secondary | ICD-10-CM | POA: Diagnosis not present

## 2021-07-21 DIAGNOSIS — R278 Other lack of coordination: Secondary | ICD-10-CM | POA: Diagnosis not present

## 2021-07-21 DIAGNOSIS — G14 Postpolio syndrome: Secondary | ICD-10-CM | POA: Diagnosis not present

## 2021-07-22 DIAGNOSIS — R278 Other lack of coordination: Secondary | ICD-10-CM | POA: Diagnosis not present

## 2021-07-22 DIAGNOSIS — E1165 Type 2 diabetes mellitus with hyperglycemia: Secondary | ICD-10-CM | POA: Diagnosis not present

## 2021-07-22 DIAGNOSIS — R41840 Attention and concentration deficit: Secondary | ICD-10-CM | POA: Diagnosis not present

## 2021-07-22 DIAGNOSIS — G9349 Other encephalopathy: Secondary | ICD-10-CM | POA: Diagnosis not present

## 2021-07-22 DIAGNOSIS — M6389 Disorders of muscle in diseases classified elsewhere, multiple sites: Secondary | ICD-10-CM | POA: Diagnosis not present

## 2021-07-22 DIAGNOSIS — G14 Postpolio syndrome: Secondary | ICD-10-CM | POA: Diagnosis not present

## 2021-07-22 DIAGNOSIS — R601 Generalized edema: Secondary | ICD-10-CM | POA: Diagnosis not present

## 2021-07-23 DIAGNOSIS — R2689 Other abnormalities of gait and mobility: Secondary | ICD-10-CM | POA: Diagnosis not present

## 2021-07-23 DIAGNOSIS — G9349 Other encephalopathy: Secondary | ICD-10-CM | POA: Diagnosis not present

## 2021-07-23 DIAGNOSIS — R601 Generalized edema: Secondary | ICD-10-CM | POA: Diagnosis not present

## 2021-07-23 DIAGNOSIS — G14 Postpolio syndrome: Secondary | ICD-10-CM | POA: Diagnosis not present

## 2021-07-23 DIAGNOSIS — R41840 Attention and concentration deficit: Secondary | ICD-10-CM | POA: Diagnosis not present

## 2021-07-23 DIAGNOSIS — G4739 Other sleep apnea: Secondary | ICD-10-CM | POA: Diagnosis not present

## 2021-07-23 DIAGNOSIS — M6389 Disorders of muscle in diseases classified elsewhere, multiple sites: Secondary | ICD-10-CM | POA: Diagnosis not present

## 2021-07-23 DIAGNOSIS — M62562 Muscle wasting and atrophy, not elsewhere classified, left lower leg: Secondary | ICD-10-CM | POA: Diagnosis not present

## 2021-07-23 DIAGNOSIS — M62561 Muscle wasting and atrophy, not elsewhere classified, right lower leg: Secondary | ICD-10-CM | POA: Diagnosis not present

## 2021-07-23 DIAGNOSIS — G9341 Metabolic encephalopathy: Secondary | ICD-10-CM | POA: Diagnosis not present

## 2021-07-23 DIAGNOSIS — E1165 Type 2 diabetes mellitus with hyperglycemia: Secondary | ICD-10-CM | POA: Diagnosis not present

## 2021-07-23 DIAGNOSIS — R278 Other lack of coordination: Secondary | ICD-10-CM | POA: Diagnosis not present

## 2021-07-24 DIAGNOSIS — R278 Other lack of coordination: Secondary | ICD-10-CM | POA: Diagnosis not present

## 2021-07-24 DIAGNOSIS — E1165 Type 2 diabetes mellitus with hyperglycemia: Secondary | ICD-10-CM | POA: Diagnosis not present

## 2021-07-24 DIAGNOSIS — G9341 Metabolic encephalopathy: Secondary | ICD-10-CM | POA: Diagnosis not present

## 2021-07-24 DIAGNOSIS — G4739 Other sleep apnea: Secondary | ICD-10-CM | POA: Diagnosis not present

## 2021-07-24 DIAGNOSIS — R41840 Attention and concentration deficit: Secondary | ICD-10-CM | POA: Diagnosis not present

## 2021-07-24 DIAGNOSIS — R601 Generalized edema: Secondary | ICD-10-CM | POA: Diagnosis not present

## 2021-07-24 DIAGNOSIS — M6389 Disorders of muscle in diseases classified elsewhere, multiple sites: Secondary | ICD-10-CM | POA: Diagnosis not present

## 2021-07-24 DIAGNOSIS — G14 Postpolio syndrome: Secondary | ICD-10-CM | POA: Diagnosis not present

## 2021-07-24 DIAGNOSIS — M62561 Muscle wasting and atrophy, not elsewhere classified, right lower leg: Secondary | ICD-10-CM | POA: Diagnosis not present

## 2021-07-24 DIAGNOSIS — R2689 Other abnormalities of gait and mobility: Secondary | ICD-10-CM | POA: Diagnosis not present

## 2021-07-24 DIAGNOSIS — M62562 Muscle wasting and atrophy, not elsewhere classified, left lower leg: Secondary | ICD-10-CM | POA: Diagnosis not present

## 2021-07-24 DIAGNOSIS — G9349 Other encephalopathy: Secondary | ICD-10-CM | POA: Diagnosis not present

## 2021-07-25 DIAGNOSIS — G9349 Other encephalopathy: Secondary | ICD-10-CM | POA: Diagnosis not present

## 2021-07-25 DIAGNOSIS — R2689 Other abnormalities of gait and mobility: Secondary | ICD-10-CM | POA: Diagnosis not present

## 2021-07-25 DIAGNOSIS — R41840 Attention and concentration deficit: Secondary | ICD-10-CM | POA: Diagnosis not present

## 2021-07-25 DIAGNOSIS — M62561 Muscle wasting and atrophy, not elsewhere classified, right lower leg: Secondary | ICD-10-CM | POA: Diagnosis not present

## 2021-07-25 DIAGNOSIS — R601 Generalized edema: Secondary | ICD-10-CM | POA: Diagnosis not present

## 2021-07-25 DIAGNOSIS — G9341 Metabolic encephalopathy: Secondary | ICD-10-CM | POA: Diagnosis not present

## 2021-07-25 DIAGNOSIS — R278 Other lack of coordination: Secondary | ICD-10-CM | POA: Diagnosis not present

## 2021-07-25 DIAGNOSIS — M6389 Disorders of muscle in diseases classified elsewhere, multiple sites: Secondary | ICD-10-CM | POA: Diagnosis not present

## 2021-07-25 DIAGNOSIS — G14 Postpolio syndrome: Secondary | ICD-10-CM | POA: Diagnosis not present

## 2021-07-25 DIAGNOSIS — G4739 Other sleep apnea: Secondary | ICD-10-CM | POA: Diagnosis not present

## 2021-07-25 DIAGNOSIS — E1165 Type 2 diabetes mellitus with hyperglycemia: Secondary | ICD-10-CM | POA: Diagnosis not present

## 2021-07-25 DIAGNOSIS — M62562 Muscle wasting and atrophy, not elsewhere classified, left lower leg: Secondary | ICD-10-CM | POA: Diagnosis not present

## 2021-07-28 DIAGNOSIS — M62562 Muscle wasting and atrophy, not elsewhere classified, left lower leg: Secondary | ICD-10-CM | POA: Diagnosis not present

## 2021-07-28 DIAGNOSIS — G4739 Other sleep apnea: Secondary | ICD-10-CM | POA: Diagnosis not present

## 2021-07-28 DIAGNOSIS — G9349 Other encephalopathy: Secondary | ICD-10-CM | POA: Diagnosis not present

## 2021-07-28 DIAGNOSIS — G9341 Metabolic encephalopathy: Secondary | ICD-10-CM | POA: Diagnosis not present

## 2021-07-28 DIAGNOSIS — R2689 Other abnormalities of gait and mobility: Secondary | ICD-10-CM | POA: Diagnosis not present

## 2021-07-28 DIAGNOSIS — R41840 Attention and concentration deficit: Secondary | ICD-10-CM | POA: Diagnosis not present

## 2021-07-28 DIAGNOSIS — M6389 Disorders of muscle in diseases classified elsewhere, multiple sites: Secondary | ICD-10-CM | POA: Diagnosis not present

## 2021-07-28 DIAGNOSIS — G14 Postpolio syndrome: Secondary | ICD-10-CM | POA: Diagnosis not present

## 2021-07-28 DIAGNOSIS — E1165 Type 2 diabetes mellitus with hyperglycemia: Secondary | ICD-10-CM | POA: Diagnosis not present

## 2021-07-28 DIAGNOSIS — M62561 Muscle wasting and atrophy, not elsewhere classified, right lower leg: Secondary | ICD-10-CM | POA: Diagnosis not present

## 2021-07-28 DIAGNOSIS — R278 Other lack of coordination: Secondary | ICD-10-CM | POA: Diagnosis not present

## 2021-07-28 DIAGNOSIS — R601 Generalized edema: Secondary | ICD-10-CM | POA: Diagnosis not present

## 2021-07-29 DIAGNOSIS — R2689 Other abnormalities of gait and mobility: Secondary | ICD-10-CM | POA: Diagnosis not present

## 2021-07-29 DIAGNOSIS — G14 Postpolio syndrome: Secondary | ICD-10-CM | POA: Diagnosis not present

## 2021-07-29 DIAGNOSIS — M6389 Disorders of muscle in diseases classified elsewhere, multiple sites: Secondary | ICD-10-CM | POA: Diagnosis not present

## 2021-07-29 DIAGNOSIS — G9349 Other encephalopathy: Secondary | ICD-10-CM | POA: Diagnosis not present

## 2021-07-29 DIAGNOSIS — G4739 Other sleep apnea: Secondary | ICD-10-CM | POA: Diagnosis not present

## 2021-07-29 DIAGNOSIS — E1165 Type 2 diabetes mellitus with hyperglycemia: Secondary | ICD-10-CM | POA: Diagnosis not present

## 2021-07-29 DIAGNOSIS — M62561 Muscle wasting and atrophy, not elsewhere classified, right lower leg: Secondary | ICD-10-CM | POA: Diagnosis not present

## 2021-07-29 DIAGNOSIS — M62562 Muscle wasting and atrophy, not elsewhere classified, left lower leg: Secondary | ICD-10-CM | POA: Diagnosis not present

## 2021-07-29 DIAGNOSIS — R41840 Attention and concentration deficit: Secondary | ICD-10-CM | POA: Diagnosis not present

## 2021-07-29 DIAGNOSIS — R601 Generalized edema: Secondary | ICD-10-CM | POA: Diagnosis not present

## 2021-07-29 DIAGNOSIS — G9341 Metabolic encephalopathy: Secondary | ICD-10-CM | POA: Diagnosis not present

## 2021-07-29 DIAGNOSIS — R278 Other lack of coordination: Secondary | ICD-10-CM | POA: Diagnosis not present

## 2021-07-30 DIAGNOSIS — R278 Other lack of coordination: Secondary | ICD-10-CM | POA: Diagnosis not present

## 2021-07-30 DIAGNOSIS — G9349 Other encephalopathy: Secondary | ICD-10-CM | POA: Diagnosis not present

## 2021-07-30 DIAGNOSIS — G14 Postpolio syndrome: Secondary | ICD-10-CM | POA: Diagnosis not present

## 2021-07-30 DIAGNOSIS — R41840 Attention and concentration deficit: Secondary | ICD-10-CM | POA: Diagnosis not present

## 2021-07-30 DIAGNOSIS — M6389 Disorders of muscle in diseases classified elsewhere, multiple sites: Secondary | ICD-10-CM | POA: Diagnosis not present

## 2021-07-30 DIAGNOSIS — E1165 Type 2 diabetes mellitus with hyperglycemia: Secondary | ICD-10-CM | POA: Diagnosis not present

## 2021-07-30 DIAGNOSIS — R601 Generalized edema: Secondary | ICD-10-CM | POA: Diagnosis not present

## 2021-07-31 DIAGNOSIS — G4739 Other sleep apnea: Secondary | ICD-10-CM | POA: Diagnosis not present

## 2021-07-31 DIAGNOSIS — G9341 Metabolic encephalopathy: Secondary | ICD-10-CM | POA: Diagnosis not present

## 2021-07-31 DIAGNOSIS — M62562 Muscle wasting and atrophy, not elsewhere classified, left lower leg: Secondary | ICD-10-CM | POA: Diagnosis not present

## 2021-07-31 DIAGNOSIS — R2689 Other abnormalities of gait and mobility: Secondary | ICD-10-CM | POA: Diagnosis not present

## 2021-07-31 DIAGNOSIS — M62561 Muscle wasting and atrophy, not elsewhere classified, right lower leg: Secondary | ICD-10-CM | POA: Diagnosis not present

## 2021-07-31 DIAGNOSIS — E1165 Type 2 diabetes mellitus with hyperglycemia: Secondary | ICD-10-CM | POA: Diagnosis not present

## 2021-07-31 DIAGNOSIS — R41840 Attention and concentration deficit: Secondary | ICD-10-CM | POA: Diagnosis not present

## 2021-07-31 DIAGNOSIS — M6389 Disorders of muscle in diseases classified elsewhere, multiple sites: Secondary | ICD-10-CM | POA: Diagnosis not present

## 2021-07-31 DIAGNOSIS — G14 Postpolio syndrome: Secondary | ICD-10-CM | POA: Diagnosis not present

## 2021-07-31 DIAGNOSIS — R278 Other lack of coordination: Secondary | ICD-10-CM | POA: Diagnosis not present

## 2021-07-31 DIAGNOSIS — R601 Generalized edema: Secondary | ICD-10-CM | POA: Diagnosis not present

## 2021-07-31 DIAGNOSIS — G9349 Other encephalopathy: Secondary | ICD-10-CM | POA: Diagnosis not present

## 2021-08-01 DIAGNOSIS — G14 Postpolio syndrome: Secondary | ICD-10-CM | POA: Diagnosis not present

## 2021-08-01 DIAGNOSIS — E1165 Type 2 diabetes mellitus with hyperglycemia: Secondary | ICD-10-CM | POA: Diagnosis not present

## 2021-08-01 DIAGNOSIS — R41840 Attention and concentration deficit: Secondary | ICD-10-CM | POA: Diagnosis not present

## 2021-08-01 DIAGNOSIS — R278 Other lack of coordination: Secondary | ICD-10-CM | POA: Diagnosis not present

## 2021-08-01 DIAGNOSIS — G9349 Other encephalopathy: Secondary | ICD-10-CM | POA: Diagnosis not present

## 2021-08-01 DIAGNOSIS — R601 Generalized edema: Secondary | ICD-10-CM | POA: Diagnosis not present

## 2021-08-01 DIAGNOSIS — M6389 Disorders of muscle in diseases classified elsewhere, multiple sites: Secondary | ICD-10-CM | POA: Diagnosis not present

## 2021-08-04 DIAGNOSIS — R41840 Attention and concentration deficit: Secondary | ICD-10-CM | POA: Diagnosis not present

## 2021-08-04 DIAGNOSIS — E1165 Type 2 diabetes mellitus with hyperglycemia: Secondary | ICD-10-CM | POA: Diagnosis not present

## 2021-08-04 DIAGNOSIS — G4739 Other sleep apnea: Secondary | ICD-10-CM | POA: Diagnosis not present

## 2021-08-04 DIAGNOSIS — R2689 Other abnormalities of gait and mobility: Secondary | ICD-10-CM | POA: Diagnosis not present

## 2021-08-04 DIAGNOSIS — G14 Postpolio syndrome: Secondary | ICD-10-CM | POA: Diagnosis not present

## 2021-08-04 DIAGNOSIS — G9341 Metabolic encephalopathy: Secondary | ICD-10-CM | POA: Diagnosis not present

## 2021-08-04 DIAGNOSIS — M62562 Muscle wasting and atrophy, not elsewhere classified, left lower leg: Secondary | ICD-10-CM | POA: Diagnosis not present

## 2021-08-04 DIAGNOSIS — R278 Other lack of coordination: Secondary | ICD-10-CM | POA: Diagnosis not present

## 2021-08-04 DIAGNOSIS — G9349 Other encephalopathy: Secondary | ICD-10-CM | POA: Diagnosis not present

## 2021-08-04 DIAGNOSIS — M62561 Muscle wasting and atrophy, not elsewhere classified, right lower leg: Secondary | ICD-10-CM | POA: Diagnosis not present

## 2021-08-04 DIAGNOSIS — M6389 Disorders of muscle in diseases classified elsewhere, multiple sites: Secondary | ICD-10-CM | POA: Diagnosis not present

## 2021-08-04 DIAGNOSIS — R601 Generalized edema: Secondary | ICD-10-CM | POA: Diagnosis not present

## 2021-08-05 DIAGNOSIS — G9349 Other encephalopathy: Secondary | ICD-10-CM | POA: Diagnosis not present

## 2021-08-05 DIAGNOSIS — G14 Postpolio syndrome: Secondary | ICD-10-CM | POA: Diagnosis not present

## 2021-08-05 DIAGNOSIS — E1165 Type 2 diabetes mellitus with hyperglycemia: Secondary | ICD-10-CM | POA: Diagnosis not present

## 2021-08-05 DIAGNOSIS — G9341 Metabolic encephalopathy: Secondary | ICD-10-CM | POA: Diagnosis not present

## 2021-08-05 DIAGNOSIS — R2689 Other abnormalities of gait and mobility: Secondary | ICD-10-CM | POA: Diagnosis not present

## 2021-08-05 DIAGNOSIS — R601 Generalized edema: Secondary | ICD-10-CM | POA: Diagnosis not present

## 2021-08-05 DIAGNOSIS — M6389 Disorders of muscle in diseases classified elsewhere, multiple sites: Secondary | ICD-10-CM | POA: Diagnosis not present

## 2021-08-05 DIAGNOSIS — M62561 Muscle wasting and atrophy, not elsewhere classified, right lower leg: Secondary | ICD-10-CM | POA: Diagnosis not present

## 2021-08-05 DIAGNOSIS — M62562 Muscle wasting and atrophy, not elsewhere classified, left lower leg: Secondary | ICD-10-CM | POA: Diagnosis not present

## 2021-08-05 DIAGNOSIS — R41840 Attention and concentration deficit: Secondary | ICD-10-CM | POA: Diagnosis not present

## 2021-08-05 DIAGNOSIS — R278 Other lack of coordination: Secondary | ICD-10-CM | POA: Diagnosis not present

## 2021-08-05 DIAGNOSIS — G4739 Other sleep apnea: Secondary | ICD-10-CM | POA: Diagnosis not present

## 2021-08-06 DIAGNOSIS — E1165 Type 2 diabetes mellitus with hyperglycemia: Secondary | ICD-10-CM | POA: Diagnosis not present

## 2021-08-06 DIAGNOSIS — G9349 Other encephalopathy: Secondary | ICD-10-CM | POA: Diagnosis not present

## 2021-08-06 DIAGNOSIS — M6389 Disorders of muscle in diseases classified elsewhere, multiple sites: Secondary | ICD-10-CM | POA: Diagnosis not present

## 2021-08-06 DIAGNOSIS — R601 Generalized edema: Secondary | ICD-10-CM | POA: Diagnosis not present

## 2021-08-06 DIAGNOSIS — G14 Postpolio syndrome: Secondary | ICD-10-CM | POA: Diagnosis not present

## 2021-08-06 DIAGNOSIS — R41840 Attention and concentration deficit: Secondary | ICD-10-CM | POA: Diagnosis not present

## 2021-08-06 DIAGNOSIS — R278 Other lack of coordination: Secondary | ICD-10-CM | POA: Diagnosis not present

## 2021-08-07 DIAGNOSIS — R278 Other lack of coordination: Secondary | ICD-10-CM | POA: Diagnosis not present

## 2021-08-07 DIAGNOSIS — G14 Postpolio syndrome: Secondary | ICD-10-CM | POA: Diagnosis not present

## 2021-08-07 DIAGNOSIS — M6389 Disorders of muscle in diseases classified elsewhere, multiple sites: Secondary | ICD-10-CM | POA: Diagnosis not present

## 2021-08-07 DIAGNOSIS — E1165 Type 2 diabetes mellitus with hyperglycemia: Secondary | ICD-10-CM | POA: Diagnosis not present

## 2021-08-07 DIAGNOSIS — G9349 Other encephalopathy: Secondary | ICD-10-CM | POA: Diagnosis not present

## 2021-08-07 DIAGNOSIS — R41840 Attention and concentration deficit: Secondary | ICD-10-CM | POA: Diagnosis not present

## 2021-08-07 DIAGNOSIS — R601 Generalized edema: Secondary | ICD-10-CM | POA: Diagnosis not present

## 2021-08-08 DIAGNOSIS — C61 Malignant neoplasm of prostate: Secondary | ICD-10-CM | POA: Diagnosis not present

## 2021-08-08 DIAGNOSIS — M6281 Muscle weakness (generalized): Secondary | ICD-10-CM | POA: Diagnosis not present

## 2021-08-08 DIAGNOSIS — M79605 Pain in left leg: Secondary | ICD-10-CM | POA: Diagnosis not present

## 2021-08-08 DIAGNOSIS — M79604 Pain in right leg: Secondary | ICD-10-CM | POA: Diagnosis not present

## 2021-08-08 DIAGNOSIS — M5416 Radiculopathy, lumbar region: Secondary | ICD-10-CM | POA: Diagnosis not present

## 2021-08-08 DIAGNOSIS — E1122 Type 2 diabetes mellitus with diabetic chronic kidney disease: Secondary | ICD-10-CM | POA: Diagnosis not present

## 2021-08-08 DIAGNOSIS — E1159 Type 2 diabetes mellitus with other circulatory complications: Secondary | ICD-10-CM | POA: Diagnosis not present

## 2021-08-08 DIAGNOSIS — M48061 Spinal stenosis, lumbar region without neurogenic claudication: Secondary | ICD-10-CM | POA: Diagnosis not present

## 2021-08-08 DIAGNOSIS — M5136 Other intervertebral disc degeneration, lumbar region: Secondary | ICD-10-CM | POA: Diagnosis not present

## 2021-08-11 DIAGNOSIS — G9349 Other encephalopathy: Secondary | ICD-10-CM | POA: Diagnosis not present

## 2021-08-11 DIAGNOSIS — R41840 Attention and concentration deficit: Secondary | ICD-10-CM | POA: Diagnosis not present

## 2021-08-11 DIAGNOSIS — E1165 Type 2 diabetes mellitus with hyperglycemia: Secondary | ICD-10-CM | POA: Diagnosis not present

## 2021-08-11 DIAGNOSIS — M6389 Disorders of muscle in diseases classified elsewhere, multiple sites: Secondary | ICD-10-CM | POA: Diagnosis not present

## 2021-08-11 DIAGNOSIS — G14 Postpolio syndrome: Secondary | ICD-10-CM | POA: Diagnosis not present

## 2021-08-11 DIAGNOSIS — R601 Generalized edema: Secondary | ICD-10-CM | POA: Diagnosis not present

## 2021-08-11 DIAGNOSIS — R278 Other lack of coordination: Secondary | ICD-10-CM | POA: Diagnosis not present

## 2021-08-12 DIAGNOSIS — M6389 Disorders of muscle in diseases classified elsewhere, multiple sites: Secondary | ICD-10-CM | POA: Diagnosis not present

## 2021-08-12 DIAGNOSIS — E1165 Type 2 diabetes mellitus with hyperglycemia: Secondary | ICD-10-CM | POA: Diagnosis not present

## 2021-08-12 DIAGNOSIS — G9349 Other encephalopathy: Secondary | ICD-10-CM | POA: Diagnosis not present

## 2021-08-12 DIAGNOSIS — R278 Other lack of coordination: Secondary | ICD-10-CM | POA: Diagnosis not present

## 2021-08-12 DIAGNOSIS — G4739 Other sleep apnea: Secondary | ICD-10-CM | POA: Diagnosis not present

## 2021-08-12 DIAGNOSIS — M62561 Muscle wasting and atrophy, not elsewhere classified, right lower leg: Secondary | ICD-10-CM | POA: Diagnosis not present

## 2021-08-12 DIAGNOSIS — G14 Postpolio syndrome: Secondary | ICD-10-CM | POA: Diagnosis not present

## 2021-08-12 DIAGNOSIS — M62562 Muscle wasting and atrophy, not elsewhere classified, left lower leg: Secondary | ICD-10-CM | POA: Diagnosis not present

## 2021-08-12 DIAGNOSIS — R41840 Attention and concentration deficit: Secondary | ICD-10-CM | POA: Diagnosis not present

## 2021-08-12 DIAGNOSIS — G9341 Metabolic encephalopathy: Secondary | ICD-10-CM | POA: Diagnosis not present

## 2021-08-12 DIAGNOSIS — R2689 Other abnormalities of gait and mobility: Secondary | ICD-10-CM | POA: Diagnosis not present

## 2021-08-12 DIAGNOSIS — R601 Generalized edema: Secondary | ICD-10-CM | POA: Diagnosis not present

## 2021-08-13 DIAGNOSIS — E1165 Type 2 diabetes mellitus with hyperglycemia: Secondary | ICD-10-CM | POA: Diagnosis not present

## 2021-08-13 DIAGNOSIS — M6389 Disorders of muscle in diseases classified elsewhere, multiple sites: Secondary | ICD-10-CM | POA: Diagnosis not present

## 2021-08-13 DIAGNOSIS — R601 Generalized edema: Secondary | ICD-10-CM | POA: Diagnosis not present

## 2021-08-13 DIAGNOSIS — G9349 Other encephalopathy: Secondary | ICD-10-CM | POA: Diagnosis not present

## 2021-08-13 DIAGNOSIS — R41840 Attention and concentration deficit: Secondary | ICD-10-CM | POA: Diagnosis not present

## 2021-08-13 DIAGNOSIS — G14 Postpolio syndrome: Secondary | ICD-10-CM | POA: Diagnosis not present

## 2021-08-13 DIAGNOSIS — R278 Other lack of coordination: Secondary | ICD-10-CM | POA: Diagnosis not present

## 2021-08-14 DIAGNOSIS — M6389 Disorders of muscle in diseases classified elsewhere, multiple sites: Secondary | ICD-10-CM | POA: Diagnosis not present

## 2021-08-14 DIAGNOSIS — R278 Other lack of coordination: Secondary | ICD-10-CM | POA: Diagnosis not present

## 2021-08-14 DIAGNOSIS — R41840 Attention and concentration deficit: Secondary | ICD-10-CM | POA: Diagnosis not present

## 2021-08-14 DIAGNOSIS — G9349 Other encephalopathy: Secondary | ICD-10-CM | POA: Diagnosis not present

## 2021-08-14 DIAGNOSIS — G14 Postpolio syndrome: Secondary | ICD-10-CM | POA: Diagnosis not present

## 2021-08-14 DIAGNOSIS — E1165 Type 2 diabetes mellitus with hyperglycemia: Secondary | ICD-10-CM | POA: Diagnosis not present

## 2021-08-14 DIAGNOSIS — R601 Generalized edema: Secondary | ICD-10-CM | POA: Diagnosis not present

## 2021-08-15 DIAGNOSIS — R41840 Attention and concentration deficit: Secondary | ICD-10-CM | POA: Diagnosis not present

## 2021-08-15 DIAGNOSIS — R601 Generalized edema: Secondary | ICD-10-CM | POA: Diagnosis not present

## 2021-08-15 DIAGNOSIS — M6389 Disorders of muscle in diseases classified elsewhere, multiple sites: Secondary | ICD-10-CM | POA: Diagnosis not present

## 2021-08-15 DIAGNOSIS — E1165 Type 2 diabetes mellitus with hyperglycemia: Secondary | ICD-10-CM | POA: Diagnosis not present

## 2021-08-15 DIAGNOSIS — G9349 Other encephalopathy: Secondary | ICD-10-CM | POA: Diagnosis not present

## 2021-08-15 DIAGNOSIS — R278 Other lack of coordination: Secondary | ICD-10-CM | POA: Diagnosis not present

## 2021-08-15 DIAGNOSIS — G14 Postpolio syndrome: Secondary | ICD-10-CM | POA: Diagnosis not present

## 2021-08-16 ENCOUNTER — Encounter (HOSPITAL_COMMUNITY): Payer: Self-pay | Admitting: Emergency Medicine

## 2021-08-16 ENCOUNTER — Other Ambulatory Visit: Payer: Self-pay

## 2021-08-16 ENCOUNTER — Emergency Department (HOSPITAL_COMMUNITY)
Admission: EM | Admit: 2021-08-16 | Discharge: 2021-08-16 | Disposition: A | Discharge status: Skilled Nursing Facility | Payer: Medicare PPO | Attending: Emergency Medicine | Admitting: Emergency Medicine

## 2021-08-16 ENCOUNTER — Emergency Department (HOSPITAL_COMMUNITY): Payer: Medicare PPO

## 2021-08-16 DIAGNOSIS — R600 Localized edema: Secondary | ICD-10-CM | POA: Diagnosis not present

## 2021-08-16 DIAGNOSIS — R079 Chest pain, unspecified: Secondary | ICD-10-CM

## 2021-08-16 DIAGNOSIS — Z7401 Bed confinement status: Secondary | ICD-10-CM | POA: Diagnosis not present

## 2021-08-16 DIAGNOSIS — I10 Essential (primary) hypertension: Secondary | ICD-10-CM | POA: Diagnosis not present

## 2021-08-16 DIAGNOSIS — R531 Weakness: Secondary | ICD-10-CM | POA: Diagnosis not present

## 2021-08-16 DIAGNOSIS — R0789 Other chest pain: Secondary | ICD-10-CM | POA: Diagnosis not present

## 2021-08-16 LAB — CBC WITH DIFFERENTIAL/PLATELET
Abs Immature Granulocytes: 0.05 10*3/uL (ref 0.00–0.07)
Basophils Absolute: 0.1 10*3/uL (ref 0.0–0.1)
Basophils Relative: 1 %
Eosinophils Absolute: 0.2 10*3/uL (ref 0.0–0.5)
Eosinophils Relative: 3 %
HCT: 38.5 % — ABNORMAL LOW (ref 39.0–52.0)
Hemoglobin: 13.1 g/dL (ref 13.0–17.0)
Immature Granulocytes: 1 %
Lymphocytes Relative: 14 %
Lymphs Abs: 1 10*3/uL (ref 0.7–4.0)
MCH: 31.4 pg (ref 26.0–34.0)
MCHC: 34 g/dL (ref 30.0–36.0)
MCV: 92.3 fL (ref 80.0–100.0)
Monocytes Absolute: 0.4 10*3/uL (ref 0.1–1.0)
Monocytes Relative: 6 %
Neutro Abs: 5.2 10*3/uL (ref 1.7–7.7)
Neutrophils Relative %: 75 %
Platelets: 222 10*3/uL (ref 150–400)
RBC: 4.17 MIL/uL — ABNORMAL LOW (ref 4.22–5.81)
RDW: 12.7 % (ref 11.5–15.5)
WBC: 7 10*3/uL (ref 4.0–10.5)
nRBC: 0 % (ref 0.0–0.2)

## 2021-08-16 LAB — COMPREHENSIVE METABOLIC PANEL
ALT: 24 U/L (ref 0–44)
AST: 20 U/L (ref 15–41)
Albumin: 3.5 g/dL (ref 3.5–5.0)
Alkaline Phosphatase: 62 U/L (ref 38–126)
Anion gap: 8 (ref 5–15)
BUN: 19 mg/dL (ref 8–23)
CO2: 28 mmol/L (ref 22–32)
Calcium: 9.5 mg/dL (ref 8.9–10.3)
Chloride: 102 mmol/L (ref 98–111)
Creatinine, Ser: 0.91 mg/dL (ref 0.61–1.24)
GFR, Estimated: >60 mL/min (ref >60)
Glucose, Bld: 203 mg/dL — ABNORMAL HIGH (ref 70–99)
Potassium: 4.2 mmol/L (ref 3.5–5.1)
Sodium: 138 mmol/L (ref 135–145)
Total Bilirubin: 0.7 mg/dL (ref 0.3–1.2)
Total Protein: 6.4 g/dL — ABNORMAL LOW (ref 6.5–8.1)

## 2021-08-16 LAB — D-DIMER, QUANTITATIVE: D-Dimer, Quant: 0.77 ug/mL-FEU — ABNORMAL HIGH (ref 0.00–0.50)

## 2021-08-16 LAB — TROPONIN I (HIGH SENSITIVITY)
Troponin I (High Sensitivity): 10 ng/L (ref <18)
Troponin I (High Sensitivity): 9 ng/L (ref <18)

## 2021-08-16 LAB — BRAIN NATRIURETIC PEPTIDE: B Natriuretic Peptide: 19.9 pg/mL (ref 0.0–100.0)

## 2021-08-16 NOTE — ED Provider Notes (Signed)
[facility] EMERGENCY DEPARTMENT Provider Note   CSN: [id number] Arrival date & time: 08/16/21  1401     History  Chief Complaint  Patient presents with   Chest Pain    Eddie Sanders is a 88 y.o. male.  HPI 88 year old male brought in by EMS for chest pain.  History initially from paramedic, who reports he has had on and off chest pain for about 4 days and his nurse at the assisted living facility advised he come get checked out.  He tells me he has been dealing with 4 days of intermittent chest pain.  Feels like a light weight on the right side of his chest.  Comes and goes with no clear etiology.  Last maybe 20 seconds at a time.  Nothing he does makes it better or worse.  No pleuritic symptoms.  No cough, shortness of breath, fever.  He has chronic bilateral lower extremity edema but over the last 6 weeks or so has been getting progressively worse.  No abdominal or back pain.  States that a day before this started he was walking with physical therapy and he fell and took down the physical therapist as well.  He did not feel like there was any injury but the symptoms started the next day.  Has multiple comorbidities which include diabetes, postpolio syndrome and hyperlipidemia.  Home Medications Prior to Admission medications   Medication Sig Start Date End Date Taking? Authorizing Provider  acetaminophen (TYLENOL) 325 MG tablet Take 650 mg by mouth every 4 (four) hours as needed for mild pain.    [provider]  metFORMIN (GLUCOPHAGE) 500 MG tablet Take 500 mg by mouth 2 (two) times daily with a meal.    [provider]  polyethylene glycol (MIRALAX / GLYCOLAX) 17 g packet Take 17 g by mouth daily as needed.    [provider]  saccharomyces boulardii (FLORASTOR) 250 MG capsule Take 250 mg by mouth daily at 12 noon.    [provider]  senna (SENOKOT) 8.6 MG tablet Take 2 tablets by mouth at bedtime as needed for constipation.     [provider]  simvastatin (ZOCOR) 20 MG tablet Take 20 mg by mouth 3 (three) times a week.    [provider]  UNABLE TO FIND Med Name: BiPap Machine    [provider]      Allergies    Penicillins    Review of Systems   Review of Systems  Constitutional:  Negative for fever.  Respiratory:  Negative for cough and shortness of breath.   Cardiovascular:  Positive for chest pain and leg swelling.  Gastrointestinal:  Negative for abdominal pain.  Musculoskeletal:  Negative for back pain.    Physical Exam Updated Vital Signs BP (!) 185/96   Pulse 78   Temp 97.6 F (36.4 C) (Oral)   Resp 15   Ht '5\' 11"'$  (1.803 m)   Wt 78.7 kg   SpO2 98%   BMI 24.20 kg/m  Physical Exam Vitals and nursing note reviewed.  Constitutional:      General: He is not in acute distress.    Appearance: He is well-developed. He is not ill-appearing or diaphoretic.  HENT:     Head: Normocephalic and atraumatic.  Cardiovascular:     Rate and Rhythm: Normal rate and regular rhythm.     Heart sounds: Normal heart sounds.  Pulmonary:     Effort: Pulmonary effort is normal.  Breath sounds: Normal breath sounds.  Chest:     Chest wall: No tenderness.     Comments: No obvious rash or bruising to the chest or back Abdominal:     Palpations: Abdomen is soft.     Tenderness: There is no abdominal tenderness.  Musculoskeletal:     Right lower leg: Edema present.     Left lower leg: Edema present.     Comments: Bilateral lower extremity edema, feet, ankles and lower legs  Skin:    General: Skin is warm and dry.  Neurological:     Mental Status: He is alert.     ED Results / Procedures / Treatments   Labs (all labs ordered are listed, but only abnormal results are displayed) Labs Reviewed  COMPREHENSIVE METABOLIC PANEL - Abnormal; Notable for the following components:      Result Value   Glucose, Bld 203 (*)    Total Protein 6.4 (*)    All other components within  normal limits  CBC WITH DIFFERENTIAL/PLATELET - Abnormal; Notable for the following components:   RBC 4.17 (*)    HCT 38.5 (*)    All other components within normal limits  D-DIMER, QUANTITATIVE - Abnormal; Notable for the following components:   D-Dimer, Quant 0.77 (*)    All other components within normal limits  BRAIN NATRIURETIC PEPTIDE  TROPONIN I (HIGH SENSITIVITY)  TROPONIN I (HIGH SENSITIVITY)    EKG EKG Interpretation  Date/Time:  Saturday August 16 2021 14:08:52 EDT Ventricular Rate:  85 PR Interval:  191 QRS Duration: 104 QT Interval:  380 QTC Calculation: 452 R Axis:   -34 Text Interpretation: Sinus rhythm Left axis deviation RSR' in V1 or V2, right VCD or RVH rate is slower, otherwise ECG unchanged from May 2023 Confirmed by Sherwood Gambler [phone]) on 08/16/2021 2:20:12 PM  Radiology DG Ribs Unilateral W/Chest Right  Result Date: 08/16/2021 CLINICAL DATA:  Fall, right chest pain EXAM: RIGHT RIBS AND CHEST - 3+ VIEW COMPARISON:  06/23/2021 FINDINGS: No fracture or other bone lesions are seen involving the ribs. There is no evidence of pneumothorax or pleural effusion. Both lungs are clear. Heart size and mediastinal contours are within normal limits. IMPRESSION: Negative. Electronically Signed   By: Davina Poke D.O.   On: 08/16/2021 15:34    Procedures Procedures    Medications Ordered in ED Medications - No data to display  ED Course/ Medical Decision Making/ A&P                           Medical Decision Making Amount and/or Complexity of Data Reviewed Independent Historian: EMS External Data Reviewed: notes.    Details: Discharge summary from May 2023 Labs: ordered.    Details: Age-adjusted D-dimer is negative. Troponins negative x 2 Radiology: ordered and independent interpretation performed.    Details: No pneumothorax or rib fracture ECG/medicine tests: ordered and independent interpretation performed.    Details: No acute ischemia compared to  baseline   Patient's leg swelling has been for quite some time and I doubt this is acutely relevant today.  No MI on work-up.  Doubt dissection.  PE is thought to be less likely as well though with his relatively sedentary nature due to his postpolio syndrome I think D-dimer is warranted and this was age-adjusted negative.  Chest x-ray clear.  No current chest pain.  Unclear why he is having this pain, perhaps it was from the fall, but I think  he appears stable for discharge home to follow-up with PCP.  Given return precautions.        Final Clinical Impression(s) / ED Diagnoses Final diagnoses:  Nonspecific chest pain    Rx / DC Orders ED Discharge Orders     None         Sherwood Gambler, MD 08/16/21 1825

## 2021-08-16 NOTE — Discharge Instructions (Signed)
If you develop recurrent, continued, or worsening chest pain, shortness of breath, fever, vomiting, abdominal or back pain, or any other new/concerning symptoms then return to the ER for evaluation.  

## 2021-08-16 NOTE — ED Notes (Signed)
Patient transported to X-ray 

## 2021-08-16 NOTE — ED Notes (Signed)
Ptar called 

## 2021-08-16 NOTE — ED Triage Notes (Signed)
Pt BIB [facility] from [facility] due to right sided chest pain for 4 days intermittently.  No radiation.  Increased swelling bilaterally to lower extremities.  Reports decreased urination the last couple days.  Pt states he is not on any diuretics.  0/10 pain at this time. VS BP 142/96, HR 78, SpO2 96%, CBG 244

## 2021-08-20 ENCOUNTER — Other Ambulatory Visit (HOSPITAL_COMMUNITY): Payer: Self-pay | Admitting: *Deleted

## 2021-08-20 DIAGNOSIS — R601 Generalized edema: Secondary | ICD-10-CM | POA: Diagnosis not present

## 2021-08-20 DIAGNOSIS — M6389 Disorders of muscle in diseases classified elsewhere, multiple sites: Secondary | ICD-10-CM | POA: Diagnosis not present

## 2021-08-20 DIAGNOSIS — G14 Postpolio syndrome: Secondary | ICD-10-CM | POA: Diagnosis not present

## 2021-08-20 DIAGNOSIS — G9349 Other encephalopathy: Secondary | ICD-10-CM | POA: Diagnosis not present

## 2021-08-20 DIAGNOSIS — E1165 Type 2 diabetes mellitus with hyperglycemia: Secondary | ICD-10-CM | POA: Diagnosis not present

## 2021-08-20 DIAGNOSIS — R278 Other lack of coordination: Secondary | ICD-10-CM | POA: Diagnosis not present

## 2021-08-20 DIAGNOSIS — R41840 Attention and concentration deficit: Secondary | ICD-10-CM | POA: Diagnosis not present

## 2021-08-21 ENCOUNTER — Ambulatory Visit (HOSPITAL_COMMUNITY)
Admission: RE | Admit: 2021-08-21 | Discharge: 2021-08-21 | Disposition: A | Discharge status: Home / Self Care | Payer: Medicare PPO | Source: Ambulatory Visit | Attending: Internal Medicine | Admitting: Internal Medicine

## 2021-08-21 DIAGNOSIS — M810 Age-related osteoporosis without current pathological fracture: Secondary | ICD-10-CM | POA: Insufficient documentation

## 2021-08-21 MED ORDER — DENOSUMAB 60 MG/ML ~~LOC~~ SOSY
60.0000 mg | PREFILLED_SYRINGE | Freq: Once | SUBCUTANEOUS | Status: AC
  Administered 2021-08-21: 60 mg via SUBCUTANEOUS

## 2021-08-21 MED ORDER — DENOSUMAB 60 MG/ML ~~LOC~~ SOSY
PREFILLED_SYRINGE | SUBCUTANEOUS | Status: AC
  Filled 2021-08-21: qty 1

## 2021-08-22 DIAGNOSIS — R601 Generalized edema: Secondary | ICD-10-CM | POA: Diagnosis not present

## 2021-08-22 DIAGNOSIS — G14 Postpolio syndrome: Secondary | ICD-10-CM | POA: Diagnosis not present

## 2021-08-22 DIAGNOSIS — R41840 Attention and concentration deficit: Secondary | ICD-10-CM | POA: Diagnosis not present

## 2021-08-22 DIAGNOSIS — G9349 Other encephalopathy: Secondary | ICD-10-CM | POA: Diagnosis not present

## 2021-08-22 DIAGNOSIS — E1165 Type 2 diabetes mellitus with hyperglycemia: Secondary | ICD-10-CM | POA: Diagnosis not present

## 2021-08-22 DIAGNOSIS — M6389 Disorders of muscle in diseases classified elsewhere, multiple sites: Secondary | ICD-10-CM | POA: Diagnosis not present

## 2021-08-22 DIAGNOSIS — R278 Other lack of coordination: Secondary | ICD-10-CM | POA: Diagnosis not present

## 2021-08-25 DIAGNOSIS — R278 Other lack of coordination: Secondary | ICD-10-CM | POA: Diagnosis not present

## 2021-08-25 DIAGNOSIS — R41840 Attention and concentration deficit: Secondary | ICD-10-CM | POA: Diagnosis not present

## 2021-08-25 DIAGNOSIS — E1165 Type 2 diabetes mellitus with hyperglycemia: Secondary | ICD-10-CM | POA: Diagnosis not present

## 2021-08-25 DIAGNOSIS — G9349 Other encephalopathy: Secondary | ICD-10-CM | POA: Diagnosis not present

## 2021-08-25 DIAGNOSIS — G14 Postpolio syndrome: Secondary | ICD-10-CM | POA: Diagnosis not present

## 2021-08-25 DIAGNOSIS — R601 Generalized edema: Secondary | ICD-10-CM | POA: Diagnosis not present

## 2021-08-25 DIAGNOSIS — M6389 Disorders of muscle in diseases classified elsewhere, multiple sites: Secondary | ICD-10-CM | POA: Diagnosis not present

## 2021-08-28 DIAGNOSIS — R278 Other lack of coordination: Secondary | ICD-10-CM | POA: Diagnosis not present

## 2021-08-28 DIAGNOSIS — R601 Generalized edema: Secondary | ICD-10-CM | POA: Diagnosis not present

## 2021-08-28 DIAGNOSIS — E1165 Type 2 diabetes mellitus with hyperglycemia: Secondary | ICD-10-CM | POA: Diagnosis not present

## 2021-08-28 DIAGNOSIS — R41840 Attention and concentration deficit: Secondary | ICD-10-CM | POA: Diagnosis not present

## 2021-08-28 DIAGNOSIS — M6389 Disorders of muscle in diseases classified elsewhere, multiple sites: Secondary | ICD-10-CM | POA: Diagnosis not present

## 2021-08-28 DIAGNOSIS — G14 Postpolio syndrome: Secondary | ICD-10-CM | POA: Diagnosis not present

## 2021-08-28 DIAGNOSIS — G9349 Other encephalopathy: Secondary | ICD-10-CM | POA: Diagnosis not present

## 2021-08-29 DIAGNOSIS — G9349 Other encephalopathy: Secondary | ICD-10-CM | POA: Diagnosis not present

## 2021-08-29 DIAGNOSIS — M6389 Disorders of muscle in diseases classified elsewhere, multiple sites: Secondary | ICD-10-CM | POA: Diagnosis not present

## 2021-08-29 DIAGNOSIS — R601 Generalized edema: Secondary | ICD-10-CM | POA: Diagnosis not present

## 2021-08-29 DIAGNOSIS — E1165 Type 2 diabetes mellitus with hyperglycemia: Secondary | ICD-10-CM | POA: Diagnosis not present

## 2021-08-29 DIAGNOSIS — R278 Other lack of coordination: Secondary | ICD-10-CM | POA: Diagnosis not present

## 2021-08-29 DIAGNOSIS — G14 Postpolio syndrome: Secondary | ICD-10-CM | POA: Diagnosis not present

## 2021-08-29 DIAGNOSIS — R41840 Attention and concentration deficit: Secondary | ICD-10-CM | POA: Diagnosis not present

## 2021-09-01 DIAGNOSIS — G14 Postpolio syndrome: Secondary | ICD-10-CM | POA: Diagnosis not present

## 2021-09-01 DIAGNOSIS — G9349 Other encephalopathy: Secondary | ICD-10-CM | POA: Diagnosis not present

## 2021-09-01 DIAGNOSIS — R41840 Attention and concentration deficit: Secondary | ICD-10-CM | POA: Diagnosis not present

## 2021-09-01 DIAGNOSIS — E1165 Type 2 diabetes mellitus with hyperglycemia: Secondary | ICD-10-CM | POA: Diagnosis not present

## 2021-09-01 DIAGNOSIS — M6389 Disorders of muscle in diseases classified elsewhere, multiple sites: Secondary | ICD-10-CM | POA: Diagnosis not present

## 2021-09-01 DIAGNOSIS — R278 Other lack of coordination: Secondary | ICD-10-CM | POA: Diagnosis not present

## 2021-09-01 DIAGNOSIS — R601 Generalized edema: Secondary | ICD-10-CM | POA: Diagnosis not present

## 2021-09-04 DIAGNOSIS — R278 Other lack of coordination: Secondary | ICD-10-CM | POA: Diagnosis not present

## 2021-09-04 DIAGNOSIS — E1165 Type 2 diabetes mellitus with hyperglycemia: Secondary | ICD-10-CM | POA: Diagnosis not present

## 2021-09-04 DIAGNOSIS — R41840 Attention and concentration deficit: Secondary | ICD-10-CM | POA: Diagnosis not present

## 2021-09-04 DIAGNOSIS — M6389 Disorders of muscle in diseases classified elsewhere, multiple sites: Secondary | ICD-10-CM | POA: Diagnosis not present

## 2021-09-04 DIAGNOSIS — R601 Generalized edema: Secondary | ICD-10-CM | POA: Diagnosis not present

## 2021-09-04 DIAGNOSIS — G14 Postpolio syndrome: Secondary | ICD-10-CM | POA: Diagnosis not present

## 2021-09-04 DIAGNOSIS — G9349 Other encephalopathy: Secondary | ICD-10-CM | POA: Diagnosis not present

## 2021-09-08 ENCOUNTER — Other Ambulatory Visit: Payer: Self-pay | Admitting: Registered Nurse

## 2021-09-08 ENCOUNTER — Ambulatory Visit
Admission: RE | Admit: 2021-09-08 | Discharge: 2021-09-08 | Disposition: A | Discharge status: Home / Self Care | Payer: Medicare PPO | Source: Ambulatory Visit | Attending: Registered Nurse | Admitting: Registered Nurse

## 2021-09-08 DIAGNOSIS — I872 Venous insufficiency (chronic) (peripheral): Secondary | ICD-10-CM | POA: Diagnosis not present

## 2021-09-08 DIAGNOSIS — I2510 Atherosclerotic heart disease of native coronary artery without angina pectoris: Secondary | ICD-10-CM | POA: Diagnosis not present

## 2021-09-08 DIAGNOSIS — R7989 Other specified abnormal findings of blood chemistry: Secondary | ICD-10-CM

## 2021-09-08 DIAGNOSIS — I2699 Other pulmonary embolism without acute cor pulmonale: Secondary | ICD-10-CM | POA: Diagnosis not present

## 2021-09-08 DIAGNOSIS — R600 Localized edema: Secondary | ICD-10-CM | POA: Diagnosis not present

## 2021-09-08 DIAGNOSIS — G14 Postpolio syndrome: Secondary | ICD-10-CM | POA: Diagnosis not present

## 2021-09-08 DIAGNOSIS — K449 Diaphragmatic hernia without obstruction or gangrene: Secondary | ICD-10-CM | POA: Diagnosis not present

## 2021-09-08 DIAGNOSIS — R0789 Other chest pain: Secondary | ICD-10-CM | POA: Diagnosis not present

## 2021-09-08 DIAGNOSIS — M6281 Muscle weakness (generalized): Secondary | ICD-10-CM | POA: Diagnosis not present

## 2021-09-08 DIAGNOSIS — Z8546 Personal history of malignant neoplasm of prostate: Secondary | ICD-10-CM | POA: Diagnosis not present

## 2021-09-08 MED ORDER — IOPAMIDOL (ISOVUE-370) INJECTION 76%
75.0000 mL | Freq: Once | INTRAVENOUS | Status: AC | PRN
  Administered 2021-09-08: 75 mL via INTRAVENOUS

## 2021-09-18 ENCOUNTER — Encounter: Payer: Self-pay | Admitting: Neurology

## 2021-09-18 ENCOUNTER — Ambulatory Visit: Payer: Medicare PPO | Admitting: Neurology

## 2021-09-18 VITALS — BP 150/80 | HR 70

## 2021-09-18 DIAGNOSIS — G14 Postpolio syndrome: Secondary | ICD-10-CM | POA: Diagnosis not present

## 2021-09-18 DIAGNOSIS — I2692 Saddle embolus of pulmonary artery without acute cor pulmonale: Secondary | ICD-10-CM | POA: Diagnosis not present

## 2021-09-18 DIAGNOSIS — G4731 Primary central sleep apnea: Secondary | ICD-10-CM

## 2021-09-18 DIAGNOSIS — G4739 Other sleep apnea: Secondary | ICD-10-CM | POA: Diagnosis not present

## 2021-09-18 DIAGNOSIS — Z9989 Dependence on other enabling machines and devices: Secondary | ICD-10-CM | POA: Diagnosis not present

## 2021-09-18 NOTE — Progress Notes (Signed)
SLEEP MEDICINE CLINIC    Provider:  Larey Seat, MD  Primary Care Physician:  Crist Infante, MD [place] [address]     Referring Provider: Crist Infante, [address] [place],  [place] [address]          Chief Complaint according to patient   Patient presents with:     (Initial Visit on biPAP      RN :SBiPAP was initiated at 17/20 cmH20 with heated humidity per AASM standards and pressure was advanced to BiPAP ST 23/20 cm water, ST13. When t a reduction of the AHI to 0 was reached. ALL APNEAS WERE CENTRAL, NO APNEAS WERE SEEN DURING REM SUPINE SLEEP. DIAGNOSIS 1. Central Sleep Apnea responded to BIPAP ST 23/ 20 with 13 ST and large size F and P mask, SIMPLUS FFM- and a chin strap.  2. Periodic Limb Movement Disorder only seen in the last 90 minutes of this titration, while at goal pressure for apnea treatment., but only few arousals were related.   3. EKG in regular rhythm with some isolated PVCs.      PLANS/RECOMMENDATIONS: 1. Starting BiPAP ST 23/20 cm water with ST13/minute.   2. BiPAP therapy compliance is defined as 4 hours of nightly use.    DISCUSSION: follow up in 30-60 days of therapy, with sleep clinic NP or MD .       HISTORY OF PRESENT ILLNESS:    FARID GRIGORIAN is a 88 - year- old Caucasian male  and retired Optometrist professor,  and was seen upon referral by Dr Joylene Draft in a RV on 09/18/2021. He is a post polio patient as well,  and here with his newest PAP device , a BiPAP machine to treat central apneas.  Pt here for follow up on compliance . He states that he has been through several masks and have not had luck finding one that doesn't leak. In lab a large SIMPLUS mask was used.  He presented here today with an F 20 ResMed in Large- and he needs a chin strap at home too, I noted the pinch-marks over the bridge of his nose- I offered him an F 30 I in medium today, both masks come with magnetic clip headgear- he will let me know  if this one does better at home.      Last seen by Debbora Presto, NP o 06-17-2021- following BiPAP ST titration.   01-06-2021 Mr. Brandau underwent a HST sleep study with [facility] but he has trouble to use CPAP now.  The patient does not wear dentures, but he notices that the fit of the mask and the air leakage has been difficult to tolerate and if he makes his mask too tight he has discomfort as well.  He underwent first a home sleep test on [id number] which revealed severe apnea but it was more than half of the apneas appear to be central.   He was then invited for an attended Pap titration to see if he can use a CPAP machine : CPAP was initiated at 5 cmH2O and was advanced to 17 cmH2O.  There was a reduction of his AHI to 4.8 with some improvement of sleep apnea.  However he used a small to midsize a Voora fullface mask with nasal cushions while he was in the sleep lab and he stated that this was not the mask he was later provided. He last used a Vitera in large, another FFM. Another  Resmed model the F 20 in large was given-his problem is his mouth drops open and lower rim of the mask will be in his mouth.   CC: 01-06-2021; pt alone, here because he has been unable to tolerate using the cpap. He is unable to tolerate the pressure on the machine and blows his mask off despite making changes. He has tried 3 diff full face masks,  but no chin strap (?).   In light of Professor Eisenberger's baseline study which was a home sleep test and his AHI of 58.5 with so many central apneas being mentioned that I do wonder if we should switch him to a BiPAP study.  I also would love to get him to use a chinstrap this would allow him to use nasal mask nasal pillow anything he does not need to cover his fullface.  I looked today at his fatigue severity score and he said I can swim two quarters of a mile and 25 minutes without stopping or I am tired but not fatigued.  I can walk a quarter at of a mile in less than 27  minutes but I am fatigued.  His Epworth sleepiness score remains higher at 14 and his fatigue severity is elevated which we have partially attributed to his postpolio condition.  At this time I think we will serve him better using a BiPAP and also a chinstrap.  Compliance data the patient has used over the last 30 days with the machine for about 1415 days.  Average pressure was 16 cmH2O average leak 27.7 L/min, average AHI was 14.2/h of which central apneas are 9.4/h.  So clearly this patient needs BiPAP.     For a sleep consultation. He reports an increasing degree of daytime sleepiness.  Chief concern according to patient : Presents today for concerns of falling asleep when he shouldn't be. States that he will doze off at a concert or could be talking to friends and nod off. His legs give way, right more than left.    Queen Blossom  has a past medical history of Adenomatous colon polyp, Allergic rhinitis, Osteoarthritis, GERD (gastroesophageal reflux disease), Hyperlipidemia, tonsillectomy and adenoid ectomy in 1939, Poliomyelitis in 1939, Post-polio syndrome, Gait Disorder since 1964,   Prostate cancer ([place]), and Skin cancer (melanoma) ([place]).    Sleep relevant medical history:  Tonsillectomy, post polio .    Family medical /sleep history: no other family member on CPAP with OSA, insomnia, sleep walkers.    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  and lives in a IT [place] .  Family status is married , wife is one year older, with one daughter 22 10) and  adult  8 and 42 year old grandchildren, several great-grandchildren. .  The patient currently smokes cigars ( smoked one box a year- that's 3 a year) and none since 2016. Tobacco use- remote.  ETOH use - 3 ounces wine rarely ,one scotch a day at 4-5 Pm, 1.5 ounces. Caffeine intake in form of Coffee( 2 cups of black coffee in AM ) .Regular exercise in form of  swimming, [employer],  Now at [employer].  Hobbies : Reading and Movies.  Sleep habits are as follows: The patient's dinner time is between 5-7 PM. The patient goes to bed at 11 PM and continues to sleep for 5-6.5 hours, wakes for some bathroom breaks.   The preferred sleep position is supine on a knee  pillow and footrest pillow, with the support of 3 pillows.  Dreams are reportedly rare now.  6-7  AM is the usual rise time.  The patient wakes up spontaneously.  He  reports not feeling refreshed or restored in AM, with symptoms such as dry mouth and residual fatigue.  Naps are taken frequently, unscheduled , and lasting from 5-30 minutes and are more refreshing than nocturnal sleep.    Review of Systems: Out of a complete 14 system review, the patient complains of only the following symptoms, and all other reviewed systems are negative.:  Fatigue, sleepiness , snoring, fragmented sleep, hypersomnia.    How likely are you to doze in the following situations: 0 = not likely, 1 = slight chance, 2 = moderate chance, 3 = high chance   Sitting and Reading? Watching Television? Sitting inactive in a public place (theater or meeting)? As a passenger in a car for an hour without a break? Lying down in the afternoon when circumstances permit? Sitting and talking to someone? Sitting quietly after lunch without alcohol? In a car, while stopped for a few minutes in traffic?   Total = 14/ 24 points - not influenced by CPAP   FSS endorsed at 41/ 63 points.   Social History   Socioeconomic History   Marital status: Married    Spouse name: Not on file   Number of children: 1   Years of education: Not on file   Highest education level: Not on file  Occupational History   Occupation: retired  Tobacco Use   Smoking status: Some Days    Types: Cigars   Smokeless tobacco: Never   Tobacco comments:    has occasional cigar ;stopped cigarettes in 1965  Vaping Use   Vaping Use: Never used  Substance  and Sexual Activity   Alcohol use: Yes    Alcohol/week: 0.0 standard drinks of alcohol    Comment: 2 oz scotch, 4 oz wine   Drug use: No   Sexual activity: Not on file  Other Topics Concern   Not on file  Social History Narrative   Not on file   Social Determinants of Health   Financial Resource Strain: Not on file  Food Insecurity: Not on file  Transportation Needs: Not on file  Physical Activity: Not on file  Stress: Not on file  Social Connections: Not on file    Family History  Problem Relation Age of Onset   Pancreatic cancer Father    Cancer Father    Heart failure Mother    Heart disease Mother    Colon cancer Neg Hx     Past Medical History:  Diagnosis Date   Adenomatous colon polyp    Allergic rhinitis    Arthritis    GERD (gastroesophageal reflux disease)    Hyperlipidemia    Polio 1939   Post-polio syndrome    Prostate cancer ([place])    Skin cancer (melanoma) ([place])    Right eye    Past Surgical History:  Procedure Laterality Date   cataract surgery     bilateral   LUMBAR EPIDURAL INJECTION     LUMBAR LAMINECTOMY/DECOMPRESSION MICRODISCECTOMY Right 07/19/2013   Procedure: LUMBAR LAMINECTOMY/DECOMPRESSION MICRODISCECTOMY 2 LEVELS     lumbar  three/four,  four/five;  Surgeon: Eustace Moore, MD;  Location: [facility];  Service: Neurosurgery;  Laterality: Right;   PROSTATECTOMY     sigmoid colectomy     TONSILLECTOMY       Current Outpatient Medications  on File Prior to Visit  Medication Sig Dispense Refill   acetaminophen (TYLENOL) 325 MG tablet Take 650 mg by mouth every 4 (four) hours as needed for mild pain.     Calcium Carbonate (CALCIUM 600 PO) Take 1 tablet by mouth daily.     cholecalciferol (VITAMIN D3) 25 MCG (1000 UNIT) tablet Take 1,000 Units by mouth daily.     Coenzyme Q10 (COQ-10 PO) Take 200 mg by mouth daily.     Cyanocobalamin (VITAMIN B12) 500 MCG TABS Take 1 tablet by mouth daily.     denosumab (PROLIA) 60 MG/ML SOSY injection  Inject 60 mg into the skin every 6 (six) months.     ELIQUIS 5 MG TABS tablet Take 5 mg by mouth 2 (two) times daily.     metFORMIN (GLUCOPHAGE-XR) 500 MG 24 hr tablet Take 500 mg by mouth in the morning and at bedtime.     mupirocin ointment (BACTROBAN) 2 % Apply 1 Application topically 3 (three) times daily as needed.     simvastatin (ZOCOR) 20 MG tablet Take 20 mg by mouth 3 (three) times a week.     UNABLE TO FIND Med Name: BiPap Machine     valACYclovir (VALTREX) 1000 MG tablet Take 1,000 mg by mouth as needed (onset of fever blister).     No current facility-administered medications on file prior to visit.    Allergies  Allergen Reactions   Penicillins Hives, Swelling and Rash    Physical exam:  Today's Vitals   09/18/21 1335  BP: (!) 150/80  Pulse: 70   There is no height or weight on file to calculate BMI.   Wt Readings from Last 3 Encounters:  08/16/21 173 lb 8 oz (78.7 kg)  07/17/21 173 lb 6.4 oz (78.7 kg)  07/07/21 171 lb 6.4 oz (77.7 kg)     Ht Readings from Last 3 Encounters:  08/16/21 '5\' 11"'$  (1.803 m)  07/17/21 '5\' 11"'$  (1.803 m)  07/07/21 '5\' 11"'$  (1.803 m)      General: The patient is awake, alert and appears not in acute distress. The patient is well groomed. Head: Normocephalic, atraumatic. Neck is supple. Mallampati 2,  neck circumference: 17 inches .  Nasal airflow  patent.  Retrognathia is not seen.  Dental status: no dentures. Upper biological teeth.   Cardiovascular:  Regular rate and cardiac rhythm by pulse,  without distended neck veins. Respiratory: Lungs are clear to auscultation.  Skin:  With evidence of ankle edema, not rash.  Trunk: The patient's seated posture is erect.   Neurologic exam : The patient is awake and alert, oriented to place and time.   Memory subjective described as intact.  Attention span & concentration ability appears normal.  Speech is fluent,  without  dysarthria, dysphonia or aphasia.  Mood and affect are  appropriate.   Cranial nerves: no loss of smell or taste reported  Pupils are equal and briskly reactive to light. Left ptosis, cataract bilaterally removed, he had a macular pucker and epimembranous change on the right. Detachment of left lens, funduscopic exam deferred.   Extraocular movements in vertical and horizontal planes were intact and without nystagmus. No Diplopia. Visual fields by finger perimetry are intact. Hearing was intact to soft voice and finger rubbing.    Facial sensation intact to fine touch.  Facial motor strength is symmetric. the tongue and uvula move midline.  Neck ROM : rotation, tilt and flexion extension were normal for age and shoulder shrug was symmetrical.  Motor exam:  right quadriceps is smaller than left  Right leg muscle mass is smaller.  Normal tone without cog -wheeling, symmetric grip strength .   Sensory:  Fine touch  and vibration were not felt in either knee or ankles.  Proprioception tested in the upper extremities was normal.   Coordination: Rapid alternating movements in the fingers/hands were of normal speed.  The Finger-to-nose maneuver was intact without evidence of ataxia, dysmetria or tremor.   Gait and station: Patient could not rise unassisted from a seated position, he has to brace himself, the right leg is stiffer and weaker, the knee extension is weak, the hip flexion is weak, but he is able to dorsal-flexion of his feet.  He walked with a walker - rollator assistive device.   Toe and heel walk were deferred.  Deep tendon reflexes: in the  lower extremities are absent - attenuated  Babinski response was deferred .       After spending a total time of  45 minutes face to face and additional time for physical and neurologic examination, review of laboratory studies,  personal review of imaging studies, reports and results of other testing and review of referral information / records as far as provided in visit, I have established the  following assessments:  1) Postpolio:  there is a progressive muscle fatigue as well as general fatigue manifesting in less exercise tolerance. He also  is at risk of falling asleep , when not physically active and not stimulated. He uses a scooter within [facility] [place]. high risk of falling.   2) severe level of apnea and high central component.In lab a large SIMPLUS mask was used.  He presented here today with an F 20 ResMed in Large- and he needs a chin strap at home too, I noted the pinch-marks over the bridge of his nose- I offered him an F 30 I in medium today, both masks come with magnetic clip headgear- he will let me know if this one does better at home.   3) he developed encephalopathy on Gabapentin , used to treat leg pain. EEG: The posterior dominant rhythm consists of  8-9 Hz activity of moderate voltage (25-35 uV) seen predominantly in posterior head regions, symmetric and reactive to eye opening and eye closing.  Sleep was characterized by vertex waves, sleep spindles (12 to 14 Hz), maximal frontocentral region.  EEG showed intermittent generalized 3 to 6 Hz theta-delta slowing. Hyperventilation and photic stimulation were not performed  Leg pain - postpolio: He has chronic bilateral lower extremity edema . On 7-1 03-2021 he was back in the ED with chest pain, dx as Pulmonary Embolism, now on Eliquis. States that a day before this started he was walking with physical therapy and he fell and took down the physical therapist as well.   My Plan is to proceed with:  1)  patient lives at [facility] and is encouraged to swim and exercise with precautions- walking with a gait belt. Swimming with an attendant. Post polio concerns. consider EMG and NCV to help explain the right leg weakness progressing now. POST POLIO? He was never in an iron lung. leg pain - no more gabapentin for sure- we never got to do NCV and EMG tests with him as ordered in February.     2) central sleep apnea  - in the lab , he had excellent response to BiPAP ST-  the problem at home is the mask fit, and high air leak,. I  concentrated on a new mask - and chin strap. He felt the in lab mask was the better fit a SIMPLUS  FFM in large with chinstrap.     I would like to thank Crist Infante, [address] [place],  [place] [address] for allowing me to meet with and to take care of this pleasant patient.   In short, HOA DERISO is presenting with CSA/ not fully responsive to BiPAP ST but likely due to high air leakage. We work on air leakage.  Long RAMP time .  CC: I will share my notes with PCP.  Electronically signed by: Larey Seat, MD 09/18/2021 2:04 PM  [facility] [employer] Board certified by The [employer] of Sleep Medicine and Diplomate of the [employer] of Sleep Medicine. Board certified In Neurology through the [place], Fellow of the [employer] of Neurology. Medical Director of [employer].

## 2021-09-18 NOTE — Patient Instructions (Addendum)
He presented here today with an F 20 ResMed in Large- and he needs a chin strap at home too, I noted the pinch-marks over the bridge of his nose-  I offered him an F 30 I in medium today, both masks come with magnetic clip headgear- he will let me know if this one does better at home.   I also reviewed his compliance- he averages 4 hours . BiPAP ST setting.      Post polio patient as well.

## 2021-09-19 DIAGNOSIS — R601 Generalized edema: Secondary | ICD-10-CM | POA: Diagnosis not present

## 2021-09-19 DIAGNOSIS — R41840 Attention and concentration deficit: Secondary | ICD-10-CM | POA: Diagnosis not present

## 2021-09-19 DIAGNOSIS — R278 Other lack of coordination: Secondary | ICD-10-CM | POA: Diagnosis not present

## 2021-09-19 DIAGNOSIS — M6389 Disorders of muscle in diseases classified elsewhere, multiple sites: Secondary | ICD-10-CM | POA: Diagnosis not present

## 2021-09-19 DIAGNOSIS — E1165 Type 2 diabetes mellitus with hyperglycemia: Secondary | ICD-10-CM | POA: Diagnosis not present

## 2021-09-19 DIAGNOSIS — G14 Postpolio syndrome: Secondary | ICD-10-CM | POA: Diagnosis not present

## 2021-09-19 DIAGNOSIS — G9349 Other encephalopathy: Secondary | ICD-10-CM | POA: Diagnosis not present

## 2021-09-23 ENCOUNTER — Telehealth: Payer: Self-pay | Admitting: Neurology

## 2021-09-23 NOTE — Telephone Encounter (Signed)
Referral PT sent to [facility] [phone].

## 2021-09-24 ENCOUNTER — Telehealth: Payer: Self-pay | Admitting: Neurology

## 2021-09-24 NOTE — Telephone Encounter (Signed)
Order for NCV/EMG sent to [facility] [phone].

## 2021-10-02 DIAGNOSIS — I8311 Varicose veins of right lower extremity with inflammation: Secondary | ICD-10-CM | POA: Diagnosis not present

## 2021-10-02 DIAGNOSIS — I8312 Varicose veins of left lower extremity with inflammation: Secondary | ICD-10-CM | POA: Diagnosis not present

## 2021-10-02 DIAGNOSIS — Z85828 Personal history of other malignant neoplasm of skin: Secondary | ICD-10-CM | POA: Diagnosis not present

## 2021-10-02 DIAGNOSIS — L821 Other seborrheic keratosis: Secondary | ICD-10-CM | POA: Diagnosis not present

## 2021-10-02 DIAGNOSIS — D225 Melanocytic nevi of trunk: Secondary | ICD-10-CM | POA: Diagnosis not present

## 2021-10-02 DIAGNOSIS — D1801 Hemangioma of skin and subcutaneous tissue: Secondary | ICD-10-CM | POA: Diagnosis not present

## 2021-10-02 DIAGNOSIS — L570 Actinic keratosis: Secondary | ICD-10-CM | POA: Diagnosis not present

## 2021-10-02 DIAGNOSIS — C44311 Basal cell carcinoma of skin of nose: Secondary | ICD-10-CM | POA: Diagnosis not present

## 2021-10-02 DIAGNOSIS — I872 Venous insufficiency (chronic) (peripheral): Secondary | ICD-10-CM | POA: Diagnosis not present

## 2021-10-03 DIAGNOSIS — H26493 Other secondary cataract, bilateral: Secondary | ICD-10-CM | POA: Diagnosis not present

## 2021-10-03 DIAGNOSIS — H04123 Dry eye syndrome of bilateral lacrimal glands: Secondary | ICD-10-CM | POA: Diagnosis not present

## 2021-10-06 DIAGNOSIS — I2699 Other pulmonary embolism without acute cor pulmonale: Secondary | ICD-10-CM | POA: Diagnosis not present

## 2021-10-06 DIAGNOSIS — R600 Localized edema: Secondary | ICD-10-CM | POA: Diagnosis not present

## 2021-10-06 DIAGNOSIS — G14 Postpolio syndrome: Secondary | ICD-10-CM | POA: Diagnosis not present

## 2021-10-15 DIAGNOSIS — R531 Weakness: Secondary | ICD-10-CM | POA: Diagnosis not present

## 2021-10-15 DIAGNOSIS — G14 Postpolio syndrome: Secondary | ICD-10-CM | POA: Diagnosis not present

## 2021-10-15 DIAGNOSIS — G4761 Periodic limb movement disorder: Secondary | ICD-10-CM | POA: Diagnosis not present

## 2021-10-15 DIAGNOSIS — G4739 Other sleep apnea: Secondary | ICD-10-CM | POA: Diagnosis not present

## 2021-10-15 DIAGNOSIS — Z8612 Personal history of poliomyelitis: Secondary | ICD-10-CM | POA: Diagnosis not present

## 2021-11-04 ENCOUNTER — Telehealth: Payer: Self-pay | Admitting: Neurology

## 2021-11-04 NOTE — Telephone Encounter (Signed)
Called pt. Relayed for him to try melatonin '1mg'$  OTC po qhs. He verbalized understanding and will try this.

## 2021-11-04 NOTE — Telephone Encounter (Signed)
Per Dr. Brett Fairy, she recommends OTC melatonin po qhs

## 2021-11-04 NOTE — Telephone Encounter (Signed)
Pt said have not be able to sleep. Want to know can prescribe something to help sleep that do not interact with Eliquis. Would like a call from the nurse.

## 2021-11-11 DIAGNOSIS — C44311 Basal cell carcinoma of skin of nose: Secondary | ICD-10-CM | POA: Diagnosis not present

## 2021-11-11 DIAGNOSIS — Z85828 Personal history of other malignant neoplasm of skin: Secondary | ICD-10-CM | POA: Diagnosis not present

## 2021-11-14 DIAGNOSIS — M19011 Primary osteoarthritis, right shoulder: Secondary | ICD-10-CM | POA: Diagnosis not present

## 2021-11-14 DIAGNOSIS — M19012 Primary osteoarthritis, left shoulder: Secondary | ICD-10-CM | POA: Diagnosis not present

## 2021-11-26 DIAGNOSIS — R319 Hematuria, unspecified: Secondary | ICD-10-CM | POA: Diagnosis not present

## 2021-12-11 DIAGNOSIS — E1122 Type 2 diabetes mellitus with diabetic chronic kidney disease: Secondary | ICD-10-CM | POA: Diagnosis not present

## 2021-12-11 DIAGNOSIS — R2689 Other abnormalities of gait and mobility: Secondary | ICD-10-CM | POA: Diagnosis not present

## 2021-12-11 DIAGNOSIS — Z23 Encounter for immunization: Secondary | ICD-10-CM | POA: Diagnosis not present

## 2021-12-11 DIAGNOSIS — R310 Gross hematuria: Secondary | ICD-10-CM | POA: Diagnosis not present

## 2021-12-11 DIAGNOSIS — E785 Hyperlipidemia, unspecified: Secondary | ICD-10-CM | POA: Diagnosis not present

## 2021-12-11 DIAGNOSIS — I2699 Other pulmonary embolism without acute cor pulmonale: Secondary | ICD-10-CM | POA: Diagnosis not present

## 2021-12-11 DIAGNOSIS — N390 Urinary tract infection, site not specified: Secondary | ICD-10-CM | POA: Diagnosis not present

## 2021-12-11 DIAGNOSIS — H35379 Puckering of macula, unspecified eye: Secondary | ICD-10-CM | POA: Diagnosis not present

## 2021-12-11 DIAGNOSIS — E1159 Type 2 diabetes mellitus with other circulatory complications: Secondary | ICD-10-CM | POA: Diagnosis not present

## 2021-12-11 DIAGNOSIS — K219 Gastro-esophageal reflux disease without esophagitis: Secondary | ICD-10-CM | POA: Diagnosis not present

## 2021-12-11 DIAGNOSIS — G4733 Obstructive sleep apnea (adult) (pediatric): Secondary | ICD-10-CM | POA: Diagnosis not present

## 2021-12-18 DIAGNOSIS — E1122 Type 2 diabetes mellitus with diabetic chronic kidney disease: Secondary | ICD-10-CM | POA: Diagnosis not present

## 2021-12-18 DIAGNOSIS — N182 Chronic kidney disease, stage 2 (mild): Secondary | ICD-10-CM | POA: Diagnosis not present

## 2021-12-18 DIAGNOSIS — E785 Hyperlipidemia, unspecified: Secondary | ICD-10-CM | POA: Diagnosis not present

## 2021-12-22 DIAGNOSIS — N201 Calculus of ureter: Secondary | ICD-10-CM | POA: Diagnosis not present

## 2021-12-22 DIAGNOSIS — R3121 Asymptomatic microscopic hematuria: Secondary | ICD-10-CM | POA: Diagnosis not present

## 2021-12-22 DIAGNOSIS — N132 Hydronephrosis with renal and ureteral calculous obstruction: Secondary | ICD-10-CM | POA: Diagnosis not present

## 2021-12-23 DIAGNOSIS — L84 Corns and callosities: Secondary | ICD-10-CM | POA: Diagnosis not present

## 2021-12-23 DIAGNOSIS — B351 Tinea unguium: Secondary | ICD-10-CM | POA: Diagnosis not present

## 2021-12-23 DIAGNOSIS — M79672 Pain in left foot: Secondary | ICD-10-CM | POA: Diagnosis not present

## 2021-12-23 DIAGNOSIS — M79671 Pain in right foot: Secondary | ICD-10-CM | POA: Diagnosis not present

## 2021-12-23 DIAGNOSIS — E1159 Type 2 diabetes mellitus with other circulatory complications: Secondary | ICD-10-CM | POA: Diagnosis not present

## 2021-12-24 ENCOUNTER — Other Ambulatory Visit: Payer: Self-pay | Admitting: Urology

## 2021-12-29 NOTE — Patient Instructions (Signed)
SURGICAL WAITING ROOM VISITATION Patients having surgery or a procedure may have no more than 2 support people in the waiting area - these visitors may rotate in the visitor waiting room.   Children under the age of 18 must have an adult with them who is not the patient. If the patient needs to stay at the hospital during part of their recovery, the visitor guidelines for inpatient rooms apply.  PRE-OP VISITATION  Pre-op nurse will coordinate an appropriate time for 1 support person to accompany the patient in pre-op.  This support person may not rotate.  This visitor will be contacted when the time is appropriate for the visitor to come back in the pre-op area.  Please refer to the [facility] website for the visitor guidelines for Inpatients (after your surgery is over and you are in a regular room).  You are not required to quarantine at this time prior to your surgery. However, you must do this: Hand Hygiene often Do NOT share personal items Notify your provider if you are in close contact with someone who has COVID or you develop fever 100.4 or greater, new onset of sneezing, cough, sore throat, shortness of breath or body aches.  If you test positive for Covid or have been in contact with anyone that has tested positive in the last 10 days please notify you surgeon.    Your procedure is scheduled on:  Tuesday  January 06, 2022  Report to [facility] Main Entrance: [place] entrance where the [employer] is available.   Report to admitting at: 07:30  AM  +++++Call this number if you have any questions or problems the morning of surgery [phone]  DO NOT EAT OR DRINK ANYTHING AFTER MIDNIGHT THE NIGHT PRIOR TO YOUR SURGERY / PROCEDURE.   FOLLOW BOWEL PREP AND ANY ADDITIONAL PRE OP INSTRUCTIONS YOU RECEIVED FROM YOUR SURGEON'S OFFICE!!!   Oral Hygiene is also important to reduce your risk of infection.        Remember - BRUSH YOUR TEETH THE MORNING OF SURGERY WITH YOUR  REGULAR TOOTHPASTE  Do NOT smoke after Midnight the night before surgery.  Take ONLY these medicines the morning of surgery with A SIP OF WATER: none   If You have been diagnosed with Sleep Apnea - Bring CPAP mask and tubing day of surgery. We will provide you with a CPAP machine on the day of your surgery.                   You may not have any metal on your body including  jewelry, and body piercing  Do not wear lotions, powders, cologne, or deodorant  Men may shave face and neck.  Contacts, Hearing Aids, dentures or bridgework may not be worn into surgery.   Patients discharged on the day of surgery will not be allowed to drive home.  Someone NEEDS to stay with you for the first 24 hours after anesthesia.  Do not bring your home medications to the hospital. The Pharmacy will dispense medications listed on your medication list to you during your admission in the Hospital.  Special Instructions: Bring a copy of your healthcare power of attorney and living will documents the day of surgery, if you wish to have them scanned into your [place] Medical Records- EPIC  Please read over the following fact sheets you were given: IF YOU HAVE QUESTIONS ABOUT YOUR [place], [place] [phone]  Zigmund Daniel)   [facility] - Preparing for  Surgery Before surgery, you can play an important role.  Because skin is not sterile, your skin needs to be as free of germs as possible.  You can reduce the number of germs on your skin by washing with CHG (chlorahexidine gluconate) soap before surgery.  CHG is an antiseptic cleaner which kills germs and bonds with the skin to continue killing germs even after washing. Please DO NOT use if you have an allergy to CHG or antibacterial soaps.  If your skin becomes reddened/irritated stop using the CHG and inform your nurse when you arrive at Short Stay. Do not shave (including legs and underarms) for at least 48 hours prior to the first CHG shower.  You  may shave your face/neck.  Please follow these instructions carefully:  1.  Shower with CHG Soap the night before surgery and the  morning of surgery.  2.  If you choose to wash your hair, wash your hair first as usual with your normal  shampoo.  3.  After you shampoo, rinse your hair and body thoroughly to remove the shampoo.                             4.  Use CHG as you would any other liquid soap.  You can apply chg directly to the skin and wash.  Gently with a scrungie or clean washcloth.  5.  Apply the CHG Soap to your body ONLY FROM THE NECK DOWN.   Do not use on face/ open                           Wound or open sores. Avoid contact with eyes, ears mouth and genitals (private parts).                       Wash face,  Genitals (private parts) with your normal soap.             6.  Wash thoroughly, paying special attention to the area where your  surgery  will be performed.  7.  Thoroughly rinse your body with warm water from the neck down.  8.  DO NOT shower/wash with your normal soap after using and rinsing off the CHG Soap.            9.  Pat yourself dry with a clean towel.            10.  Wear clean pajamas.            11.  Place clean sheets on your bed the night of your first shower and do not  sleep with pets.  ON THE DAY OF SURGERY : Do not apply any lotions/deodorants the morning of surgery.  Please wear clean clothes to the hospital/surgery center.    FAILURE TO FOLLOW THESE INSTRUCTIONS MAY RESULT IN THE CANCELLATION OF YOUR SURGERY  PATIENT SIGNATURE_________________________________  NURSE SIGNATURE__________________________________  ________________________________________________________________________

## 2021-12-29 NOTE — Progress Notes (Signed)
COVID Vaccine received:  _0  No _1  Yes Date of any COVID positive Test in last 90 days:  PCP - Crist Infante, MD Cardiologist -  Neuro -  Larey Seat, MD  Chest x-ray - 1V   06-23-2021 Epic EKG - 08-16-2021  epic  Stress Test -   ECHO -  Cardiac Cath -   PCR screen: _2  Ordered & Completed                      _3   No Order but Needs PROFEND                      _4   N/A for this surgery  Surgery Plan:  _5  Ambulatory                            _6  Outpatient in bed                            _7  Admit  Anesthesia:    _8  General  _9  Spinal                           _10   Choice _11   MAC  Bowel Prep - _12  No  _13   Yes _____________  Pacemaker / ICD device _14  No _15  Yes        Device order form faxed _16  No    _17   Yes      Faxed to:  Spinal Cord Stimulator:_18  No _19  Yes      (Remind patient to bring remote DOS) Other Implants:   History of Sleep Apnea? _20  No _21  Yes   CPAP used?- _22  No _23  Yes  BiPAP  Does the patient monitor blood sugar? _24  No _25  Yes  _26  N/A Does patient have a [employer] or Dexacom? _27  No _28  Yes   Fasting Blood Sugar Ranges-  Checks Blood Sugar _____ times a day  Last dose of GLP1 agonist- none GLP1 instructions:  Last dose of SGLT-2 inhibitors- ? Jardiance  Rx 12-18-21 SGLT-2 instructions:   Blood Thinner / Instructions: Eliquis   Hold X 2 days per Dr. Joylene Draft 12-24-21 CE note Aspirin Instructions: none  ERAS Protocol Ordered: _29  No  _30  Yes PRE-SURGERY _31  ENSURE  _32  G2  _33  No Drink Ordered  Patient is to be NPO after: midnight prior  Comments:   Activity level: Patient can / can not climb a flight of stairs without difficulty; _34  No CP  _35  No SOB, but would have ______   Patient can / can not perform ADLs without assistance.   Anesthesia review: Post-polio syndrome, LLE atrophy, DM2, Hx Saddle PE, Some days-cigars, Hx prostate Ca, CKD2  Patient denies shortness of breath, fever, cough and chest pain at PAT appointment.  Patient  verbalized understanding and agreement to the Pre-Surgical Instructions that were given to them at this PAT appointment. Patient was also educated of the need to review these PAT instructions again prior to his/her surgery.I reviewed the appropriate phone numbers to call if they have any and questions or concerns.

## 2021-12-30 ENCOUNTER — Ambulatory Visit: Payer: Medicare PPO | Admitting: Family Medicine

## 2021-12-31 ENCOUNTER — Encounter (HOSPITAL_COMMUNITY): Payer: Self-pay

## 2021-12-31 ENCOUNTER — Encounter (HOSPITAL_COMMUNITY)
Admission: RE | Admit: 2021-12-31 | Discharge: 2021-12-31 | Disposition: A | Discharge status: Home / Self Care | Payer: Medicare PPO | Source: Ambulatory Visit | Attending: Urology | Admitting: Urology

## 2021-12-31 ENCOUNTER — Other Ambulatory Visit: Payer: Self-pay

## 2021-12-31 VITALS — BP 156/86 | HR 76 | Temp 97.6°F | Resp 20 | Ht 71.0 in | Wt 182.0 lb

## 2021-12-31 DIAGNOSIS — E119 Type 2 diabetes mellitus without complications: Secondary | ICD-10-CM | POA: Diagnosis not present

## 2021-12-31 DIAGNOSIS — Z01812 Encounter for preprocedural laboratory examination: Secondary | ICD-10-CM | POA: Insufficient documentation

## 2021-12-31 DIAGNOSIS — I10 Essential (primary) hypertension: Secondary | ICD-10-CM | POA: Diagnosis not present

## 2021-12-31 HISTORY — DX: Chronic kidney disease, unspecified: N18.9

## 2021-12-31 HISTORY — DX: Essential (primary) hypertension: I10

## 2021-12-31 HISTORY — DX: Heart failure, unspecified: I50.9

## 2021-12-31 HISTORY — DX: Saddle embolus of pulmonary artery without acute cor pulmonale: I26.92

## 2021-12-31 LAB — CBC
HCT: 38.2 % — ABNORMAL LOW (ref 39.0–52.0)
Hemoglobin: 12.9 g/dL — ABNORMAL LOW (ref 13.0–17.0)
MCH: 30.5 pg (ref 26.0–34.0)
MCHC: 33.8 g/dL (ref 30.0–36.0)
MCV: 90.3 fL (ref 80.0–100.0)
Platelets: 199 10*3/uL (ref 150–400)
RBC: 4.23 MIL/uL (ref 4.22–5.81)
RDW: 13.4 % (ref 11.5–15.5)
WBC: 6.8 10*3/uL (ref 4.0–10.5)
nRBC: 0 % (ref 0.0–0.2)

## 2021-12-31 LAB — BASIC METABOLIC PANEL
Anion gap: 9 (ref 5–15)
BUN: 22 mg/dL (ref 8–23)
CO2: 26 mmol/L (ref 22–32)
Calcium: 9.8 mg/dL (ref 8.9–10.3)
Chloride: 107 mmol/L (ref 98–111)
Creatinine, Ser: 0.89 mg/dL (ref 0.61–1.24)
GFR, Estimated: >60 mL/min (ref >60)
Glucose, Bld: 227 mg/dL — ABNORMAL HIGH (ref 70–99)
Potassium: 3.9 mmol/L (ref 3.5–5.1)
Sodium: 142 mmol/L (ref 135–145)

## 2021-12-31 LAB — GLUCOSE, CAPILLARY: Glucose-Capillary: 238 mg/dL — ABNORMAL HIGH (ref 70–99)

## 2021-12-31 LAB — HEMOGLOBIN A1C
Hgb A1c MFr Bld: 8.2 % — ABNORMAL HIGH (ref 4.8–5.6)
Mean Plasma Glucose: 188.64 mg/dL

## 2022-01-05 NOTE — H&P (Signed)
I have prostate cancer.  HPI: Eddie Sanders is a 88 year-old male established patient who is here evaluation for treatment of prostate cancer.  His most recent PSA is <0.015.   Mr. Broadwater returns today at the request of Dr. Joylene Draft for hematuria. I don't have records from Dr. Joylene Draft yet but he reports the urine was dark for a few days. It began after he started Eliquis in June for a PE. He had a CTC that showed the upper poles of both kidneys. He has a left peripelvic cyst. He had left renal stones on CT in 2016 and had some acute severe left flank pain back in April but that pain resolved. The urine has subsequently cleared. He has a history of prostate cancer that was treated with a radical prostatectomy in 1995. He had a PSA recurrence and had adjuvant radiation in 2005. His PSA was <0.015 at last check... He has some AM UUI and is using 1ppd which is stable. His IPSS is 7 with nocturia x 2 and some frequency. He had hematuria evaluation in 2017 for new microhematuria and was found to have a tiny renal stone and radiation cystitis. He has had no gross hematuria. His UA is clear today. He has ED but is not on treatment. He has no associated signs or symptoms.       AUA Symptom Score: He never has the sensation of not emptying his bladder completely after finishing urinating. Less than 50% of the time he has to urinate again fewer than two hours after he has finished urinating. Less than 20% of the time he has to start and stop again several times when he urinates. Less than 20% of the time he finds it difficult to postpone urination. Less than 20% of the time he has a weak urinary stream. He never has to push or strain to begin urination. He has to get up to urinate 2 times from the time he goes to bed until the time he gets up in the morning.   Calculated AUA Symptom Score: 7    ALLERGIES: Penicillins    MEDICATIONS: Simvastatin 20 mg tablet  Caltrate 600 600 mg calcium (1,500 mg) tablet Oral   Centrum Silver tablet Oral  Co Q-10 10 mg capsule Oral  D3-2000 50 mcg (2,000 unit) capsule Oral  Eliquis  Metformin Er Gastric  Vitamin B-12 2,000 mcg tablet, extended release Oral     GU PSH: No GU PSH      PSH Notes: Laminectomy Lumbar, Colon Surgery, Prostate Surgery   NON-GU PSH: Bmi>=30Or<22 Cal No Followup - 2018 Doc Meds Verified W/Pt Or Re - 2018 No Doc Of Pain - 2018 Other Pt/Ot Current Status - 2018, 2018 Other Pt/Ot D/C Status - 2018 Other Pt/Ot Goal Status - 2018, 2018, 2018 Remove Spinal Lamina - 2015     GU PMH: History of prostate cancer, His PSA remains undetectible. - 2022, His PSA remains low. He will return in a year with a PSA. , - 2021 (Stable), His PSA remains undetectible and I will have him return in about 1 yr. , - 2020, History of prostate cancer, - 2017, Prostate Cancer, - 2014 Radiation cystitis (w/o hematuria), He has no hematuria. - 2022, Radiation cystitis, - 2016 Renal calculus, He has had no hematuria or flank pain. - 2022, Nephrolithiasis, - 2016 Stress Incontinence, He uses 1ppd - 2022, - 2018, - 2018, - 2018, - 2018, Male stress incontinence, - 2017 Microscopic hematuria (Stable), He has stable  microhematuria and doesn't need reevaluation at this time. he will return in January with a PSA. - 2018 Incontinence w/o Sensation - 2018 Other microscopic hematuria, Microscopic hematuria - 2017 ED due to arterial insufficiency, Erectile dysfunction due to arterial insufficiency - 2014      PMH Notes:  2009-12-18 16:50:03 - Note: Poliomyelitis   NON-GU PMH: Muscle weakness (generalized) - 2018, - 2018, - 2018, - 2018, - 2018, - 2018, Muscle weakness, - 2014 Other muscle spasm - 2018, - 2018, - 2018, - 2018, - 2018, - 2018 Scar conditions and fibrosis of skin - 2018 Encounter for general adult medical examination without abnormal findings, Encounter for preventive health examination - 2017 Other lack of coordination, Other lack of coordination -  2014 Personal history of other endocrine, nutritional and metabolic disease, History of hypercholesterolemia - 2014 Personal history of poliomyelitis, History of post-polio syndrome - 2014 Other osteoporosis with current pathological fracture, right ankle and foot, subsequent encounter for fracture with delayed healing    FAMILY HISTORY: Family Health Status Number - Runs In Family No pertinent family history - Other   SOCIAL HISTORY: Marital Status: Married Preferred Language: English; Race: White Current Smoking Status: Patient does not smoke anymore.   Tobacco Use Assessment Completed: Used Tobacco in last 30 days? Drinks 2 caffeinated drinks per day.     Notes: Former smoker, Activities Of Daily Living, Self-reliant In Usual Daily Activities, Living Independently With Spouse, Exercise Habits, Marital History - Currently Married, Death In The Family Mother, Occupation:, Death In The Family Father, Caffeine Use, Alcohol Use, Tobacco Use   REVIEW OF SYSTEMS:    GU Review Male:   Patient reports frequent urination, get up at night to urinate, and leakage of urine. Patient denies hard to postpone urination, burning/ pain with urination, stream starts and stops, trouble starting your stream, have to strain to urinate , erection problems, and penile pain.  Gastrointestinal (Upper):   Patient denies indigestion/ heartburn, vomiting, and nausea.  Gastrointestinal (Lower):   Patient denies diarrhea and constipation.  Constitutional:   Patient denies fever, night sweats, weight loss, and fatigue.  Skin:   Patient denies skin rash/ lesion and itching.  Eyes:   Patient denies blurred vision and double vision.  Ears/ Nose/ Throat:   Patient denies sore throat and sinus problems.  Hematologic/Lymphatic:   Patient denies swollen glands and easy bruising.  Cardiovascular:   Patient denies leg swelling and chest pains.  Respiratory:   Patient denies cough and shortness of breath.  Endocrine:   Patient  denies excessive thirst.  Musculoskeletal:   Patient denies back pain and joint pain.  Neurological:   Patient denies headaches and dizziness.  Psychologic:   Patient denies depression and anxiety.   VITAL SIGNS: None   Complexity of Data:  Source Of History:  Medical Record Request  Records Review:   AUA Symptom Score, Previous Patient Records  Urine Test Review:   Urinalysis  X-Ray Review: C.T. Chest: Reviewed Films. Discussed With Patient.  C.T. Stone Protocol: Reviewed Films. Discussed With Patient.     04/26/20 04/12/19 03/21/18 04/06/17 03/12/16 03/12/15 01/25/14 01/27/13  PSA  Total PSA <0.015 ng/mL 0.01 ng/mL <0.015 ng/mL <0.015 ng/mL < 0.015 ng/dl <0.01  <0.01  <0.01     PROCEDURES:         C.T. Urogram - [id number]  He has a 8m left UVJ stone with obstruction. The hydro was present on a Chest CT in 7/23 for a PE.  Patient confirmed No Neulasta OnPro Device.         Urinalysis w/Scope Dipstick Dipstick Cont'd Micro  Color: Yellow Bilirubin: Neg mg/dL WBC/hpf: 0 - 5/hpf  Appearance: Clear Ketones: Neg mg/dL RBC/hpf: 3 - 10/hpf  Specific Gravity: 1.020 Blood: 2+ ery/uL Bacteria: Few (10-25/hpf)  pH: <=5.0 Protein: Neg mg/dL Cystals: NS (Not Seen)  Glucose: Neg mg/dL Urobilinogen: 0.2 mg/dL Casts: NS (Not Seen)    Nitrites: Neg Trichomonas: Not Present    Leukocyte Esterase: Neg leu/uL Mucous: Not Present      Epithelial Cells: 0 - 5/hpf      Yeast: NS (Not Seen)      Sperm: Not Present    ASSESSMENT:      ICD-10 Details  1 GU:   Microscopic hematuria - R31.21 Chronic, Stable - He has microhematuria and a 33m stone in the left distal ureter with hydronephrosis that has been present since at least July when he had hydro on the CT Chest. He is going to need ureteroscopy and I reviewed the risks of bleeding, infection, ureteral injury, need for secondary procedures, thrombotic events and anesthetic complications.   2   Ureteral calculus - N20.1 Chronic, Threat to  Bodily Function  3   Ureteral obstruction secondary to calculous - N13.2 Chronic, Threat to Bodily Function - He has had the hydro since July but fortunately the renal parenchyma appears well preserved.      PLAN:            Medications Stop Meds: Aspirin 81 MG TABS 0 Oral  Start: 08/23/2006  Discontinue: 12/22/2021  - Reason: The medication cycle was completed.            Orders X-Rays: C.T. Stone Protocol Without I.V. Contrast  X-Ray Notes:   History:  Hematuria: Yes/No  Patient to see MD after exam: Yes/No  Previous exam: CT / IVP/ US/ KUB/ None  When:  Where:  Diabetic: Yes/ No  BUN/ Creatinine:  Date of last BUN Creatinine:  Weight in pounds:  Allergy- IV Contrast: Yes/ No  Conflicting diabetic meds: Yes/ No  Diabetic Meds:  Prior Authorization #:Richardson [id number] 12/22/21 thru 01/21/22            Schedule Return Visit/Planned Activity: ASAP - Schedule Surgery  Procedure: Unspecified Date - Cysto Uretero Lithotripsy - [id number] left Notes: asap

## 2022-01-05 NOTE — Anesthesia Preprocedure Evaluation (Signed)
Anesthesia Evaluation  Patient identified by MRN, date of birth, ID band Patient awake    Reviewed: Allergy & Precautions, NPO status , Patient's Chart, lab work & pertinent test results  Airway Mallampati: II  TM Distance: >3 FB Neck ROM: Full    Dental no notable dental hx. (+) Teeth Intact, Dental Advisory Given   Pulmonary former smoker   Pulmonary exam normal breath sounds clear to auscultation       Cardiovascular hypertension, Pt. on medications Normal cardiovascular exam Rhythm:Regular Rate:Normal     Neuro/Psych    GI/Hepatic ,GERD  ,,  Endo/Other  diabetes, Type 2    Renal/GU Renal diseaseLab Results      Component                Value               Date                      CREATININE               0.89                12/31/2021                BUN                      22                  12/31/2021                NA                       142                 12/31/2021                K                        3.9                 12/31/2021                CL                       107                 12/31/2021                CO2                      26                  12/31/2021              Prostate CA    Musculoskeletal  (+) Arthritis ,    Abdominal   Peds  Hematology Lab Results      Component                Value               Date                      WBC                      6.8  12/31/2021                HGB                      12.9 (L)            12/31/2021                HCT                      38.2 (L)            12/31/2021                MCV                      90.3                12/31/2021                PLT                      199                 12/31/2021              Anesthesia Other Findings ALL: PCN gabapentin  Reproductive/Obstetrics                             Anesthesia Physical Anesthesia Plan  ASA: 3  Anesthesia Plan: General    Post-op Pain Management: Ofirmev IV (intra-op)*, Precedex and Minimal or no pain anticipated   Induction: Intravenous  PONV Risk Score and Plan: TIVA, Ondansetron and Treatment may vary due to age or medical condition  Airway Management Planned: LMA  Additional Equipment: None  Intra-op Plan:   Post-operative Plan:   Informed Consent: I have reviewed the patients History and Physical, chart, labs and discussed the procedure including the risks, benefits and alternatives for the proposed anesthesia with the patient or authorized representative who has indicated his/her understanding and acceptance.     Dental advisory given  Plan Discussed with: CRNA  Anesthesia Plan Comments: (TIVA GA w LMA)       Anesthesia Quick Evaluation

## 2022-01-06 ENCOUNTER — Ambulatory Visit (HOSPITAL_COMMUNITY): Payer: Medicare PPO

## 2022-01-06 ENCOUNTER — Encounter (HOSPITAL_COMMUNITY): Payer: Self-pay | Admitting: Urology

## 2022-01-06 ENCOUNTER — Ambulatory Visit (HOSPITAL_BASED_OUTPATIENT_CLINIC_OR_DEPARTMENT_OTHER): Payer: Medicare PPO | Admitting: Certified Registered Nurse Anesthetist

## 2022-01-06 ENCOUNTER — Ambulatory Visit (HOSPITAL_COMMUNITY)
Admission: RE | Admit: 2022-01-06 | Discharge: 2022-01-06 | Disposition: A | Discharge status: Home / Self Care | Payer: Medicare PPO | Source: Home / Self Care | Attending: Urology | Admitting: Urology

## 2022-01-06 ENCOUNTER — Encounter (HOSPITAL_COMMUNITY)
Admission: RE | Disposition: A | Discharge status: Home / Self Care | Payer: Self-pay | Source: Home / Self Care | Attending: Urology

## 2022-01-06 ENCOUNTER — Ambulatory Visit (HOSPITAL_COMMUNITY): Payer: Medicare PPO | Admitting: Certified Registered Nurse Anesthetist

## 2022-01-06 DIAGNOSIS — Z87891 Personal history of nicotine dependence: Secondary | ICD-10-CM | POA: Diagnosis not present

## 2022-01-06 DIAGNOSIS — N368 Other specified disorders of urethra: Secondary | ICD-10-CM | POA: Diagnosis not present

## 2022-01-06 DIAGNOSIS — E119 Type 2 diabetes mellitus without complications: Secondary | ICD-10-CM | POA: Diagnosis not present

## 2022-01-06 DIAGNOSIS — K225 Diverticulum of esophagus, acquired: Secondary | ICD-10-CM | POA: Diagnosis not present

## 2022-01-06 DIAGNOSIS — Z8546 Personal history of malignant neoplasm of prostate: Secondary | ICD-10-CM | POA: Insufficient documentation

## 2022-01-06 DIAGNOSIS — T83122A Displacement of urinary stent, initial encounter: Secondary | ICD-10-CM | POA: Diagnosis present

## 2022-01-06 DIAGNOSIS — E785 Hyperlipidemia, unspecified: Secondary | ICD-10-CM | POA: Diagnosis present

## 2022-01-06 DIAGNOSIS — R6520 Severe sepsis without septic shock: Secondary | ICD-10-CM | POA: Diagnosis present

## 2022-01-06 DIAGNOSIS — Y838 Other surgical procedures as the cause of abnormal reaction of the patient, or of later complication, without mention of misadventure at the time of the procedure: Secondary | ICD-10-CM | POA: Diagnosis present

## 2022-01-06 DIAGNOSIS — Z7901 Long term (current) use of anticoagulants: Secondary | ICD-10-CM | POA: Insufficient documentation

## 2022-01-06 DIAGNOSIS — Z79899 Other long term (current) drug therapy: Secondary | ICD-10-CM | POA: Insufficient documentation

## 2022-01-06 DIAGNOSIS — R319 Hematuria, unspecified: Secondary | ICD-10-CM | POA: Diagnosis not present

## 2022-01-06 DIAGNOSIS — E1165 Type 2 diabetes mellitus with hyperglycemia: Secondary | ICD-10-CM | POA: Diagnosis not present

## 2022-01-06 DIAGNOSIS — Y842 Radiological procedure and radiotherapy as the cause of abnormal reaction of the patient, or of later complication, without mention of misadventure at the time of the procedure: Secondary | ICD-10-CM | POA: Diagnosis present

## 2022-01-06 DIAGNOSIS — N132 Hydronephrosis with renal and ureteral calculous obstruction: Secondary | ICD-10-CM

## 2022-01-06 DIAGNOSIS — Z9079 Acquired absence of other genital organ(s): Secondary | ICD-10-CM | POA: Insufficient documentation

## 2022-01-06 DIAGNOSIS — Z515 Encounter for palliative care: Secondary | ICD-10-CM | POA: Diagnosis not present

## 2022-01-06 DIAGNOSIS — R456 Violent behavior: Secondary | ICD-10-CM | POA: Diagnosis not present

## 2022-01-06 DIAGNOSIS — Z923 Personal history of irradiation: Secondary | ICD-10-CM | POA: Insufficient documentation

## 2022-01-06 DIAGNOSIS — N200 Calculus of kidney: Secondary | ICD-10-CM | POA: Diagnosis not present

## 2022-01-06 DIAGNOSIS — G9341 Metabolic encephalopathy: Secondary | ICD-10-CM | POA: Diagnosis present

## 2022-01-06 DIAGNOSIS — Z7984 Long term (current) use of oral hypoglycemic drugs: Secondary | ICD-10-CM | POA: Diagnosis not present

## 2022-01-06 DIAGNOSIS — N201 Calculus of ureter: Secondary | ICD-10-CM | POA: Diagnosis not present

## 2022-01-06 DIAGNOSIS — N202 Calculus of kidney with calculus of ureter: Secondary | ICD-10-CM | POA: Diagnosis present

## 2022-01-06 DIAGNOSIS — E11649 Type 2 diabetes mellitus with hypoglycemia without coma: Secondary | ICD-10-CM | POA: Diagnosis not present

## 2022-01-06 DIAGNOSIS — Q396 Congenital diverticulum of esophagus: Secondary | ICD-10-CM | POA: Diagnosis not present

## 2022-01-06 DIAGNOSIS — N135 Crossing vessel and stricture of ureter without hydronephrosis: Secondary | ICD-10-CM | POA: Diagnosis not present

## 2022-01-06 DIAGNOSIS — A419 Sepsis, unspecified organism: Secondary | ICD-10-CM | POA: Diagnosis present

## 2022-01-06 DIAGNOSIS — F0392 Unspecified dementia, unspecified severity, with psychotic disturbance: Secondary | ICD-10-CM | POA: Diagnosis present

## 2022-01-06 DIAGNOSIS — Y738 Miscellaneous gastroenterology and urology devices associated with adverse incidents, not elsewhere classified: Secondary | ICD-10-CM | POA: Diagnosis present

## 2022-01-06 DIAGNOSIS — G14 Postpolio syndrome: Secondary | ICD-10-CM | POA: Diagnosis not present

## 2022-01-06 DIAGNOSIS — I10 Essential (primary) hypertension: Secondary | ICD-10-CM

## 2022-01-06 DIAGNOSIS — R410 Disorientation, unspecified: Secondary | ICD-10-CM | POA: Diagnosis not present

## 2022-01-06 DIAGNOSIS — F05 Delirium due to known physiological condition: Secondary | ICD-10-CM | POA: Diagnosis present

## 2022-01-06 DIAGNOSIS — R4189 Other symptoms and signs involving cognitive functions and awareness: Secondary | ICD-10-CM | POA: Diagnosis not present

## 2022-01-06 DIAGNOSIS — R9431 Abnormal electrocardiogram [ECG] [EKG]: Secondary | ICD-10-CM | POA: Diagnosis not present

## 2022-01-06 DIAGNOSIS — T68XXXA Hypothermia, initial encounter: Secondary | ICD-10-CM | POA: Diagnosis present

## 2022-01-06 DIAGNOSIS — Z7189 Other specified counseling: Secondary | ICD-10-CM | POA: Diagnosis not present

## 2022-01-06 DIAGNOSIS — Z888 Allergy status to other drugs, medicaments and biological substances status: Secondary | ICD-10-CM | POA: Diagnosis not present

## 2022-01-06 DIAGNOSIS — N136 Pyonephrosis: Secondary | ICD-10-CM | POA: Diagnosis present

## 2022-01-06 DIAGNOSIS — I959 Hypotension, unspecified: Secondary | ICD-10-CM | POA: Diagnosis not present

## 2022-01-06 DIAGNOSIS — E440 Moderate protein-calorie malnutrition: Secondary | ICD-10-CM | POA: Diagnosis present

## 2022-01-06 DIAGNOSIS — R531 Weakness: Secondary | ICD-10-CM | POA: Diagnosis not present

## 2022-01-06 DIAGNOSIS — T83593A Infection and inflammatory reaction due to other urinary stents, initial encounter: Secondary | ICD-10-CM | POA: Diagnosis present

## 2022-01-06 DIAGNOSIS — R4182 Altered mental status, unspecified: Secondary | ICD-10-CM | POA: Diagnosis not present

## 2022-01-06 DIAGNOSIS — N390 Urinary tract infection, site not specified: Secondary | ICD-10-CM | POA: Diagnosis not present

## 2022-01-06 DIAGNOSIS — L89620 Pressure ulcer of left heel, unstageable: Secondary | ICD-10-CM | POA: Diagnosis present

## 2022-01-06 DIAGNOSIS — Z7401 Bed confinement status: Secondary | ICD-10-CM | POA: Diagnosis not present

## 2022-01-06 DIAGNOSIS — R4589 Other symptoms and signs involving emotional state: Secondary | ICD-10-CM | POA: Diagnosis not present

## 2022-01-06 DIAGNOSIS — Z86711 Personal history of pulmonary embolism: Secondary | ICD-10-CM | POA: Insufficient documentation

## 2022-01-06 DIAGNOSIS — Y828 Other medical devices associated with adverse incidents: Secondary | ICD-10-CM | POA: Diagnosis present

## 2022-01-06 DIAGNOSIS — Z1152 Encounter for screening for COVID-19: Secondary | ICD-10-CM | POA: Diagnosis not present

## 2022-01-06 DIAGNOSIS — C61 Malignant neoplasm of prostate: Secondary | ICD-10-CM | POA: Diagnosis not present

## 2022-01-06 DIAGNOSIS — Z880 Allergy status to penicillin: Secondary | ICD-10-CM | POA: Diagnosis not present

## 2022-01-06 DIAGNOSIS — F419 Anxiety disorder, unspecified: Secondary | ICD-10-CM | POA: Diagnosis present

## 2022-01-06 DIAGNOSIS — Z66 Do not resuscitate: Secondary | ICD-10-CM | POA: Diagnosis present

## 2022-01-06 DIAGNOSIS — E1169 Type 2 diabetes mellitus with other specified complication: Secondary | ICD-10-CM | POA: Diagnosis present

## 2022-01-06 HISTORY — PX: CYSTOSCOPY WITH RETROGRADE PYELOGRAM, URETEROSCOPY AND STENT PLACEMENT: SHX5789

## 2022-01-06 LAB — GLUCOSE, CAPILLARY: Glucose-Capillary: 152 mg/dL — ABNORMAL HIGH (ref 70–99)

## 2022-01-06 SURGERY — CYSTOURETEROSCOPY, WITH RETROGRADE PYELOGRAM AND STENT INSERTION
Anesthesia: General | Laterality: Left

## 2022-01-06 MED ORDER — MORPHINE SULFATE (PF) 2 MG/ML IV SOLN: 1.0000 mg | INTRAVENOUS | Status: DC | PRN

## 2022-01-06 MED ORDER — DEXMEDETOMIDINE HCL IN NACL 80 MCG/20ML IV SOLN
INTRAVENOUS | Status: DC | PRN
  Administered 2022-01-06: 4 ug via BUCCAL

## 2022-01-06 MED ORDER — PROPOFOL 500 MG/50ML IV EMUL
INTRAVENOUS | Status: DC | PRN
  Administered 2022-01-06: 75 ug/kg/min via INTRAVENOUS

## 2022-01-06 MED ORDER — SODIUM CHLORIDE 0.9% FLUSH: 3.0000 mL | Freq: Two times a day (BID) | INTRAVENOUS | Status: DC

## 2022-01-06 MED ORDER — SODIUM CHLORIDE 0.9% FLUSH: 3.0000 mL | INTRAVENOUS | Status: DC | PRN

## 2022-01-06 MED ORDER — ONDANSETRON HCL 4 MG/2ML IJ SOLN
INTRAMUSCULAR | Status: AC
  Filled 2022-01-06: qty 2

## 2022-01-06 MED ORDER — LACTATED RINGERS IV SOLN: INTRAVENOUS | Status: DC

## 2022-01-06 MED ORDER — PHENYLEPHRINE 80 MCG/ML (10ML) SYRINGE FOR IV PUSH (FOR BLOOD PRESSURE SUPPORT)
PREFILLED_SYRINGE | INTRAVENOUS | Status: DC | PRN
  Administered 2022-01-06 (×2): 120 ug via INTRAVENOUS
  Administered 2022-01-06: 160 ug via INTRAVENOUS

## 2022-01-06 MED ORDER — EPHEDRINE 5 MG/ML INJ
INTRAVENOUS | Status: AC
  Filled 2022-01-06: qty 5

## 2022-01-06 MED ORDER — 0.9 % SODIUM CHLORIDE (POUR BTL) OPTIME
TOPICAL | Status: DC | PRN
  Administered 2022-01-06: 1000 mL

## 2022-01-06 MED ORDER — ONDANSETRON HCL 4 MG/2ML IJ SOLN
INTRAMUSCULAR | Status: DC | PRN
  Administered 2022-01-06: 4 mg via INTRAVENOUS

## 2022-01-06 MED ORDER — PROPOFOL 10 MG/ML IV BOLUS
INTRAVENOUS | Status: DC | PRN
  Administered 2022-01-06: 70 mg via INTRAVENOUS

## 2022-01-06 MED ORDER — SODIUM CHLORIDE 0.9 % IV SOLN: 250.0000 mL | INTRAVENOUS | Status: DC | PRN

## 2022-01-06 MED ORDER — EPHEDRINE SULFATE-NACL 50-0.9 MG/10ML-% IV SOSY
PREFILLED_SYRINGE | INTRAVENOUS | Status: DC | PRN
  Administered 2022-01-06 (×2): 5 mg via INTRAVENOUS

## 2022-01-06 MED ORDER — PROPOFOL 10 MG/ML IV BOLUS
INTRAVENOUS | Status: AC
  Filled 2022-01-06: qty 20

## 2022-01-06 MED ORDER — PHENYLEPHRINE 80 MCG/ML (10ML) SYRINGE FOR IV PUSH (FOR BLOOD PRESSURE SUPPORT)
PREFILLED_SYRINGE | INTRAVENOUS | Status: AC
  Filled 2022-01-06: qty 10

## 2022-01-06 MED ORDER — OXYCODONE HCL 5 MG PO TABS: 5.0000 mg | ORAL_TABLET | ORAL | Status: DC | PRN

## 2022-01-06 MED ORDER — ACETAMINOPHEN 650 MG RE SUPP: 650.0000 mg | RECTAL | Status: DC | PRN

## 2022-01-06 MED ORDER — ACETAMINOPHEN 325 MG PO TABS: 650.0000 mg | ORAL_TABLET | ORAL | Status: DC | PRN

## 2022-01-06 MED ORDER — ACETAMINOPHEN 10 MG/ML IV SOLN: 1000.0000 mg | Freq: Once | INTRAVENOUS | Status: DC | PRN

## 2022-01-06 MED ORDER — IOHEXOL 300 MG/ML  SOLN
INTRAMUSCULAR | Status: DC | PRN
  Administered 2022-01-06: 4 mL

## 2022-01-06 MED ORDER — FENTANYL CITRATE (PF) 100 MCG/2ML IJ SOLN
INTRAMUSCULAR | Status: DC | PRN
  Administered 2022-01-06: 50 ug via INTRAVENOUS
  Administered 2022-01-06: 25 ug via INTRAVENOUS

## 2022-01-06 MED ORDER — PROPOFOL 1000 MG/100ML IV EMUL
INTRAVENOUS | Status: AC
  Filled 2022-01-06: qty 100

## 2022-01-06 MED ORDER — HYDROCODONE-ACETAMINOPHEN 5-325 MG PO TABS: 1.0000 | ORAL_TABLET | Freq: Four times a day (QID) | ORAL | 0 refills | Status: DC | PRN

## 2022-01-06 MED ORDER — CIPROFLOXACIN IN D5W 200 MG/100ML IV SOLN
200.0000 mg | INTRAVENOUS | Status: AC
  Administered 2022-01-06: 400 mg via INTRAVENOUS
  Filled 2022-01-06: qty 100

## 2022-01-06 MED ORDER — FENTANYL CITRATE PF 50 MCG/ML IJ SOSY: 25.0000 ug | PREFILLED_SYRINGE | INTRAMUSCULAR | Status: DC | PRN

## 2022-01-06 MED ORDER — SODIUM CHLORIDE 0.9 % IR SOLN
Status: DC | PRN
  Administered 2022-01-06: 6000 mL via INTRAVESICAL

## 2022-01-06 MED ORDER — CHLORHEXIDINE GLUCONATE 0.12 % MT SOLN
15.0000 mL | Freq: Once | OROMUCOSAL | Status: AC
  Administered 2022-01-06: 15 mL via OROMUCOSAL

## 2022-01-06 MED ORDER — FENTANYL CITRATE (PF) 100 MCG/2ML IJ SOLN
INTRAMUSCULAR | Status: AC
  Filled 2022-01-06: qty 2

## 2022-01-06 MED ORDER — LIDOCAINE 2% (20 MG/ML) 5 ML SYRINGE
INTRAMUSCULAR | Status: DC | PRN
  Administered 2022-01-06: 80 mg via INTRAVENOUS

## 2022-01-06 MED ORDER — ONDANSETRON HCL 4 MG/2ML IJ SOLN: 4.0000 mg | Freq: Once | INTRAMUSCULAR | Status: DC | PRN

## 2022-01-06 MED ORDER — ORAL CARE MOUTH RINSE: 15.0000 mL | Freq: Once | OROMUCOSAL | Status: AC

## 2022-01-06 MED ORDER — LIDOCAINE HCL (PF) 2 % IJ SOLN
INTRAMUSCULAR | Status: AC
  Filled 2022-01-06: qty 5

## 2022-01-06 SURGICAL SUPPLY — 26 items
BAG URO CATCHER STRL LF (MISCELLANEOUS) ×1 IMPLANT
BALLN NEPHROSTOMY (BALLOONS) ×1 IMPLANT
BALLOON NEPHROSTOMY (BALLOONS) IMPLANT
BASKET STONE NCOMPASS (UROLOGICAL SUPPLIES) IMPLANT
CATH ROBINSON RED A/P 14FR (CATHETERS) IMPLANT
CATH URETERAL DUAL LUMEN 10F (MISCELLANEOUS) IMPLANT
CATH URETL OPEN 5X70 (CATHETERS) IMPLANT
CLOTH BEACON ORANGE TIMEOUT ST (SAFETY) ×1 IMPLANT
EXTRACTOR STONE NITINOL NGAGE (UROLOGICAL SUPPLIES) IMPLANT
GLOVE SURG SS PI 8.0 STRL IVOR (GLOVE) ×1 IMPLANT
GOWN STRL REUS W/ TWL XL LVL3 (GOWN DISPOSABLE) ×1 IMPLANT
GOWN STRL REUS W/TWL XL LVL3 (GOWN DISPOSABLE) ×1
GUIDEWIRE STR DUAL SENSOR (WIRE) ×1 IMPLANT
IV NS IRRIG 3000ML ARTHROMATIC (IV SOLUTION) ×1 IMPLANT
KIT TURNOVER KIT A (KITS) IMPLANT
LASER FIB FLEXIVA PULSE ID 365 (Laser) IMPLANT
LASER FIB FLEXIVA PULSE ID 550 (Laser) IMPLANT
LASER FIB FLEXIVA PULSE ID 910 (Laser) IMPLANT
MANIFOLD NEPTUNE II (INSTRUMENTS) ×1 IMPLANT
PACK CYSTO (CUSTOM PROCEDURE TRAY) ×1 IMPLANT
SHEATH NAVIGATOR HD 11/13X36 (SHEATH) IMPLANT
STENT URET 6FRX26 CONTOUR (STENTS) IMPLANT
TRACTIP FLEXIVA PULS ID 200XHI (Laser) IMPLANT
TRACTIP FLEXIVA PULSE ID 200 (Laser)
TUBING CONNECTING 10 (TUBING) ×1 IMPLANT
TUBING UROLOGY SET (TUBING) ×1 IMPLANT

## 2022-01-06 NOTE — Transfer of Care (Signed)
Immediate Anesthesia Transfer of Care Note  Patient: Queen Blossom  Procedure(s) Performed: CYSTOSCOPY WITH LEFT RETROGRADE PYELOGRAM, URETEROSCOPY WITH  HOLMIUM LASER  AND STENT PLACEMENT (Left)  Patient Location: PACU  Anesthesia Type:General  Level of Consciousness: drowsy  Airway & Oxygen Therapy: Patient Spontanous Breathing and Patient connected to face mask  Post-op Assessment: Report given to RN and Post -op Vital signs reviewed and stable  Post vital signs: Reviewed and stable  Last Vitals:  Vitals Value Taken Time  BP 111/56 01/06/22 1143  Temp    Pulse 56 01/06/22 1145  Resp 10 01/06/22 1145  SpO2 100 % 01/06/22 1145  Vitals shown include unvalidated device data.  Last Pain:  Vitals:   01/06/22 0813  PainSc: 0-No pain         Complications: No notable events documented.

## 2022-01-06 NOTE — Anesthesia Procedure Notes (Signed)
Procedure Name: LMA Insertion Date/Time: 01/06/2022 10:12 AM  Performed by: Claudia Desanctis, CRNAPre-anesthesia Checklist: Emergency Drugs available, Patient identified, Suction available and Patient being monitored Patient Re-evaluated:Patient Re-evaluated prior to induction Oxygen Delivery Method: Circle system utilized Preoxygenation: Pre-oxygenation with 100% oxygen Induction Type: IV induction Ventilation: Mask ventilation without difficulty LMA: LMA inserted and LMA with gastric port inserted LMA Size: 4.0 Number of attempts: 1 Placement Confirmation: positive ETCO2 and breath sounds checked- equal and bilateral Tube secured with: Tape Dental Injury: Teeth and Oropharynx as per pre-operative assessment

## 2022-01-06 NOTE — Interval H&P Note (Signed)
History and Physical Interval Note:  01/06/2022 9:10 AM  Eddie Sanders  has presented today for surgery, with the diagnosis of LEFT URETERAL [place].  The various methods of treatment have been discussed with the patient and family. After consideration of risks, benefits and other options for treatment, the patient has consented to  Procedure(s) with comments: CYSTOSCOPY WITH LEFT RETROGRADE PYELOGRAM, URETEROSCOPY WITH  HOLMIUM LASER  AND STENT PLACEMENT (Left) - 1 HR FOR CASE as a surgical intervention.  The patient's history has been reviewed, patient examined, no change in status, stable for surgery.  I have reviewed the patient's chart and labs.  Questions were answered to the patient's satisfaction.     Irine Seal

## 2022-01-06 NOTE — Anesthesia Postprocedure Evaluation (Signed)
Anesthesia Post Note  Patient: Eddie Sanders  Procedure(s) Performed: CYSTOSCOPY WITH LEFT RETROGRADE PYELOGRAM, URETEROSCOPY WITH  HOLMIUM LASER  AND STENT PLACEMENT (Left)     Patient location during evaluation: PACU Anesthesia Type: General Level of consciousness: awake and alert Pain management: pain level controlled Vital Signs Assessment: post-procedure vital signs reviewed and stable Respiratory status: spontaneous breathing, nonlabored ventilation, respiratory function stable and patient connected to nasal cannula oxygen Cardiovascular status: blood pressure returned to baseline and stable Postop Assessment: no apparent nausea or vomiting Anesthetic complications: no  No notable events documented.  Last Vitals:  Vitals:   01/06/22 1200 01/06/22 1215  BP: 127/66 (!) 143/69  Pulse: (!) 57 (!) 55  Resp: 14 10  SpO2: 97% 96%    Last Pain:  Vitals:   01/06/22 1215  PainSc: [place]

## 2022-01-06 NOTE — Op Note (Signed)
Procedure: 1.  Cystoscopy with left retrograde pyelogram and interpretation. 2.  Left ureteroscopy with holmium laser application, stone extraction and insertion of left double-J stent. 3.  Application of fluoroscopy.  Pre-op diagnosis: 14 mm left distal ureteral stone with obstruction.  Postop diagnosis: Same.  Surgeon: Dr. Irine Seal.  Anesthesia: General.  Specimen: Stone fragments.  Drains: 6 French by 26 cm left contour double-J stent with tether.  EBL: None.  Complications: None.  Indications: Patient is a 88 year old male who was recently evaluated for hematuria and was found to have a cluster of 2 stones in the left distal ureter with a linear dimension of 14 mm and marked hydronephrosis.  The renal dilation had actually been present on a CT scan in July done for pulmonary embolism workup.  Procedure: He was taken the operating room where general anesthetic was induced.  He received antibiotics.  He was placed in lithotomy position and fitted with PAS hose.  His perineum and genitalia were prepped with Betadine solution he was draped in usual sterile fashion.  Cystoscopy was performed using the 21 [place] scope and 30 degree lens.  Examination revealed a normal urethra.  The external sphincter was intact.  The prostate was absent from prior prostatectomy and there were some radiation changes from salvage radiation done in 2005.  Examination of bladder revealed moderate trabeculation without tumors, stones or inflammation.  The right ureteral orifice was unremarkable.  The left ureteral orifice was unremarkable.  The left ureteral orifice was cannulated with a 5 [place] open-ended catheter and contrast was instilled.  The left retrograde pyelogram demonstrated a narrowed intramural ureter with the larger stone impacted just above that with some dye transit into the dilated ureter with the second stone just above.  An attempt was made to pass a sensor wire through the open-ended  catheter but it would not go by the stone initially.  I then changed the cystoscope for a 6.5 French dual-lumen ureteroscope but was unable to get that up the orifice so I changed to the 4.5 single-lumen short ureteroscope and was able to enter the orifice and see the stone.  With the improved visualization I was able to negotiate a wire by the stone to the kidney under fluoroscopic guidance.  There appeared to be a small false passage from the initial attempt.  I then attempted to pass a balloon dilation catheter over the wire but I was unable to get it beyond the meatus.  Once the wire was in place a 14 French red rubber catheter was inserted to help maintain bladder decompression and the 4.5 French short semirigid ureteroscope was passed alongside the wire.  A 365 m laser fiber was then passed through the scope with the laser set on 1 J and 20 Hz.  The stone was then infract fragmented but manageable fragments.  Once the initial fragmentation had been performed there was sufficient dilation of the distal ureter to allow passage of the 6.5 [place] scope.  With the 6.5 [place] scope I used the engage basket to remove the initial fragment burden and then continue to to laser the stone as needed until I was above the larger distal stone and was able to fragment the smaller proximal stone.  I then used the engage basket to remove all significant fragments.  At the completion of the ureteroscopy the ureter was widely patent but there was a moderate amount of inflammation from the impacted stone in the procedure so it was felt that stenting was  indicated.  The cystoscope was then reinserted over the wire and a 6 [place] by 26 cm contour double-J stent with tether was advanced to the kidney under fluoroscopic guidance.  The wire was removed, leaving the good coil in the kidney and a good coil in the bladder.  The bladder was then drained and the cystoscope was removed leaving the stent string exiting the  urethra.  The cystoscope was reinserted alongside the wire and the fragments were then removed from the bladder.  The cystoscope was then removed.  The stent string was secured to the patient's penis.  He was taken down from lithotomy position, his anesthetic was reversed and he was moved recovery in stable condition.  There were no complications.

## 2022-01-06 NOTE — Discharge Instructions (Signed)
You may remove the stent by pulling the attached string on Monday morning.  If you don't feel you can do that, please call the office to get set up to come have that done.

## 2022-01-07 ENCOUNTER — Encounter (HOSPITAL_COMMUNITY): Payer: Self-pay | Admitting: Urology

## 2022-01-07 ENCOUNTER — Non-Acute Institutional Stay (SKILLED_NURSING_FACILITY): Payer: Medicare PPO | Admitting: Orthopedic Surgery

## 2022-01-07 DIAGNOSIS — Z86711 Personal history of pulmonary embolism: Secondary | ICD-10-CM | POA: Diagnosis not present

## 2022-01-07 DIAGNOSIS — N368 Other specified disorders of urethra: Secondary | ICD-10-CM

## 2022-01-07 DIAGNOSIS — E1165 Type 2 diabetes mellitus with hyperglycemia: Secondary | ICD-10-CM | POA: Diagnosis not present

## 2022-01-07 DIAGNOSIS — R4189 Other symptoms and signs involving cognitive functions and awareness: Secondary | ICD-10-CM

## 2022-01-07 NOTE — Progress Notes (Signed)
Location:  [facility] Room Number: 158/A Place of Service:  SNF (31) Provider:  Windell Moulding, NP   Patient Care Team: Crist Infante, MD as PCP - General (Internal Medicine)  Extended Emergency Contact Information Primary Emergency Contact: Denzil Hughes Address: [address]  [place] Home Phone: [phone] Mobile Phone: [phone] Relation: Spouse Secondary Emergency Contact: [place] Mobile Phone: [phone] Relation: Daughter  Code Status:  DNR Goals of care: Advanced Directive information    01/07/2022    2:42 PM  Advanced Directives  Does Patient Have a Medical Advance Directive? Yes  Type of Advance Directive Living will;Out of facility DNR (pink MOST or yellow form)  Does patient want to make changes to medical advance directive? No - Patient declined     Chief Complaint  Patient presents with  . Hospitalization Follow-up    Hospital Follow up visit for a Procedure that was done on yesterday.  . Quality Metric Gaps    To Discuss the following as listed to the Care Gaps as listed below.     HPI:  Pt is a 88 y.o. male seen today for hospital f/u s/p urethral stent placement 01/06/2022.   He currently resides on the rehabilitation unit at [facility]. PMH: PE, OSA, HLD,T2DM, post polio syndrome, prostate cancer, renal stones, and cognitive impairment.   Past Medical History:  Diagnosis Date  . Adenomatous colon polyp   . Allergic rhinitis   . Arthritis   . CHF (congestive heart failure) ([place])   . Chronic kidney disease   . GERD (gastroesophageal reflux disease)   . Hyperlipidemia   . Hypertension   . Polio 02/16/1937  . Post-polio syndrome   . Prostate cancer ([place])   . Saddle pulmonary embolus ([place])   . Skin cancer (melanoma) ([place])    Right eye   Past Surgical History:  Procedure Laterality Date  . cataract surgery     bilateral  . CYSTOSCOPY WITH RETROGRADE PYELOGRAM, URETEROSCOPY  AND STENT PLACEMENT Left 01/06/2022   Procedure: CYSTOSCOPY WITH LEFT RETROGRADE PYELOGRAM, URETEROSCOPY WITH  HOLMIUM LASER  AND STENT PLACEMENT;  Surgeon: Irine Seal, MD;  Location: [facility]S;  Service: Urology;  Laterality: Left;  1 HR FOR CASE  . LUMBAR EPIDURAL INJECTION    . LUMBAR LAMINECTOMY/DECOMPRESSION MICRODISCECTOMY Right 07/19/2013   Procedure: LUMBAR LAMINECTOMY/DECOMPRESSION MICRODISCECTOMY 2 LEVELS     lumbar  three/four,  four/five;  Surgeon: Eustace Moore, MD;  Location: [facility];  Service: Neurosurgery;  Laterality: Right;  . PROSTATECTOMY    . sigmoid colectomy    . TONSILLECTOMY      Allergies  Allergen Reactions  . Gabapentin Other (See Comments)    Hallucination  . Potassium-Containing Compounds Hives, Diarrhea and Nausea And Vomiting  . Benzodiazepines Itching, Swelling and Rash  . Penicillins Hives, Swelling and Rash    Outpatient Encounter Medications as of 01/07/2022  Medication Sig  . acetaminophen (TYLENOL) 500 MG tablet Take 1,000 mg by mouth at bedtime.  Marland Kitchen denosumab (PROLIA) 60 MG/ML SOSY injection Inject 60 mg into the skin every 6 (six) months.  Marland Kitchen ELIQUIS 5 MG TABS tablet Take 5 mg by mouth 2 (two) times daily.  Marland Kitchen HYDROcodone-acetaminophen (NORCO/VICODIN) 5-325 MG tablet Take 1 tablet by mouth every 6 (six) hours as needed for moderate pain.  Marland Kitchen LORazepam (ATIVAN) 0.5 MG tablet Take 0.5 mg by mouth as needed for anxiety.  . metFORMIN (GLUCOPHAGE-XR) 500 MG 24 hr  tablet Take 500 mg by mouth in the morning and at bedtime.  . simvastatin (ZOCOR) 20 MG tablet Take 20 mg by mouth 3 (three) times a week.  . valACYclovir (VALTREX) 1000 MG tablet Take 1,000 mg by mouth as needed (onset of fever blister).  . [DISCONTINUED] cholecalciferol (VITAMIN D3) 25 MCG (1000 UNIT) tablet Take 1,000 Units by mouth daily.  . [DISCONTINUED] Coenzyme Q10 (COQ-10) 200 MG CAPS Take 200 mg by mouth daily.  . [DISCONTINUED] Cyanocobalamin (VITAMIN B12) 500 MCG TABS Take 500 mcg by  mouth daily.  . [DISCONTINUED] UNABLE TO FIND Med Name: BiPap Machine   No facility-administered encounter medications on file as of 01/07/2022.    Review of Systems  Immunization History  Administered Date(s) Administered  . Influenza Split 12/03/2009, 11/25/2010, 03/03/2011, 11/10/2011, 12/09/2012, 11/17/2013  . Influenza, Quadrivalent, Recombinant, Inj, Pf 10/30/2017, 11/04/2018, 11/29/2019, 12/07/2020  . Influenza,inj,Quad PF,6+ Mos 11/17/2013, 11/03/2014  . Influenza-Unspecified 11/02/2015, 11/28/2016  . Moderna Covid-19 Vaccine Bivalent Booster 53yr & up 11/14/2020  . Moderna SARS-COV2 Booster Vaccination 07/16/2020, 12/26/2021  . Pneumococcal Polysaccharide-23 03/03/2011  . Pneumococcal-Unspecified 05/10/2012  . Td 03/03/2011  . Tdap 01/19/2013, 02/13/2019  . Zoster Recombinat (Shingrix) 04/10/2018  . Zoster, Live 12/03/2009, 03/03/2011, 10/05/2017   Pertinent  Health Maintenance Due  Topic Date Due  . FOOT EXAM  Never done  . OPHTHALMOLOGY EXAM  Never done  . INFLUENZA VACCINE  09/16/2021  . HEMOGLOBIN A1C  07/01/2022      06/26/2021    7:48 PM 06/27/2021    8:00 AM 08/16/2021    2:10 PM 01/06/2022    8:11 AM 01/07/2022    2:36 PM  Fall Risk  Falls in the past year?     0  Was there an injury with Fall?     0  Fall Risk Category Calculator     0  Fall Risk Category     Low  Patient Fall Risk Level High fall risk High fall risk Moderate fall risk High fall risk High fall risk  Patient at Risk for Falls Due to     Post anesthesia  Fall risk Follow up     Falls evaluation completed   Functional Status Survey:    Vitals:   01/07/22 1433  BP: (!) 155/89  Pulse: 61  Resp: 18  Temp: (!) 93.7 F (34.3 C)  SpO2: 98%  Weight: 175 lb 6 oz (79.5 kg)  Height: '5\' 11"'$  (1.803 m)   Body mass index is 24.46 kg/m. Physical Exam  Labs reviewed: Recent Labs    06/25/21 0302 08/16/21 1443 12/31/21 1043  NA 137 138 142  K 4.7 4.2 3.9  CL 107 102 107  CO2 21* 28  26  GLUCOSE 196* 203* 227*  BUN '16 19 22  '$ CREATININE 0.94 0.91 0.89  CALCIUM 9.1 9.5 9.8   Recent Labs    06/20/21 1603 06/23/21 0255 08/16/21 1443  AST 11* 15 20  ALT '13 15 24  '$ ALKPHOS 58 54 62  BILITOT 0.6 1.1 0.7  PROT 6.8 6.2* 6.4*  ALBUMIN 4.0 3.4* 3.5   Recent Labs    06/20/21 1603 06/20/21 1908 06/23/21 0255 06/23/21 0326 06/25/21 0302 08/16/21 1443 12/31/21 1043  WBC 8.6  --  11.1*   < > 9.9 7.0 6.8  NEUTROABS 6.0  --  8.4*  --   --  5.2  --   HGB 14.2   < > 14.0   < > 13.7 13.1 12.9*  HCT 42.2   < >  40.1   < > 39.7 38.5* 38.2*  MCV 90.0  --  90.1   < > 89.2 92.3 90.3  PLT 213  --  213   < > 188 222 199   < > = values in this interval not displayed.   Lab Results  Component Value Date   TSH 1.555 06/23/2021   Lab Results  Component Value Date   HGBA1C 8.2 (H) 12/31/2021   No results found for: "CHOL", "HDL", "LDLCALC", "LDLDIRECT", "TRIG", "CHOLHDL"  Significant Diagnostic Results in last 30 days:  DG C-Arm 1-60 Min-No Report  Result Date: 01/06/2022 Fluoroscopy was utilized by the requesting physician.  No radiographic interpretation.   DG C-Arm 1-60 Min-No Report  Result Date: 01/06/2022 Fluoroscopy was utilized by the requesting physician.  No radiographic interpretation.    Assessment/Plan There are no diagnoses linked to this encounter.   Family/ staff Communication: ***  Labs/tests ordered:  ***

## 2022-01-08 ENCOUNTER — Emergency Department (HOSPITAL_COMMUNITY): Payer: Medicare PPO

## 2022-01-08 ENCOUNTER — Other Ambulatory Visit: Payer: Self-pay

## 2022-01-08 ENCOUNTER — Inpatient Hospital Stay (HOSPITAL_COMMUNITY)
Admission: EM | Admit: 2022-01-08 | Discharge: 2022-01-13 | DRG: 659 | Disposition: A | Discharge status: Skilled Nursing Facility | Payer: Medicare PPO | Source: Skilled Nursing Facility | Attending: Internal Medicine | Admitting: Internal Medicine

## 2022-01-08 DIAGNOSIS — G14 Postpolio syndrome: Secondary | ICD-10-CM | POA: Diagnosis present

## 2022-01-08 DIAGNOSIS — Z923 Personal history of irradiation: Secondary | ICD-10-CM

## 2022-01-08 DIAGNOSIS — Y828 Other medical devices associated with adverse incidents: Secondary | ICD-10-CM | POA: Diagnosis present

## 2022-01-08 DIAGNOSIS — Y738 Miscellaneous gastroenterology and urology devices associated with adverse incidents, not elsewhere classified: Secondary | ICD-10-CM | POA: Diagnosis present

## 2022-01-08 DIAGNOSIS — E11649 Type 2 diabetes mellitus with hypoglycemia without coma: Secondary | ICD-10-CM | POA: Diagnosis not present

## 2022-01-08 DIAGNOSIS — I440 Atrioventricular block, first degree: Secondary | ICD-10-CM | POA: Diagnosis present

## 2022-01-08 DIAGNOSIS — Z9079 Acquired absence of other genital organ(s): Secondary | ICD-10-CM

## 2022-01-08 DIAGNOSIS — Z8546 Personal history of malignant neoplasm of prostate: Secondary | ICD-10-CM

## 2022-01-08 DIAGNOSIS — B952 Enterococcus as the cause of diseases classified elsewhere: Secondary | ICD-10-CM | POA: Diagnosis present

## 2022-01-08 DIAGNOSIS — T83593A Infection and inflammatory reaction due to other urinary stents, initial encounter: Principal | ICD-10-CM | POA: Diagnosis present

## 2022-01-08 DIAGNOSIS — C61 Malignant neoplasm of prostate: Secondary | ICD-10-CM | POA: Diagnosis present

## 2022-01-08 DIAGNOSIS — F419 Anxiety disorder, unspecified: Secondary | ICD-10-CM | POA: Diagnosis present

## 2022-01-08 DIAGNOSIS — Z79899 Other long term (current) drug therapy: Secondary | ICD-10-CM

## 2022-01-08 DIAGNOSIS — Z86010 Personal history of colonic polyps: Secondary | ICD-10-CM

## 2022-01-08 DIAGNOSIS — T68XXXA Hypothermia, initial encounter: Secondary | ICD-10-CM | POA: Diagnosis not present

## 2022-01-08 DIAGNOSIS — R4589 Other symptoms and signs involving emotional state: Secondary | ICD-10-CM

## 2022-01-08 DIAGNOSIS — Q396 Congenital diverticulum of esophagus: Secondary | ICD-10-CM

## 2022-01-08 DIAGNOSIS — L89620 Pressure ulcer of left heel, unstageable: Secondary | ICD-10-CM | POA: Diagnosis present

## 2022-01-08 DIAGNOSIS — E785 Hyperlipidemia, unspecified: Secondary | ICD-10-CM | POA: Diagnosis present

## 2022-01-08 DIAGNOSIS — Z87891 Personal history of nicotine dependence: Secondary | ICD-10-CM

## 2022-01-08 DIAGNOSIS — N390 Urinary tract infection, site not specified: Secondary | ICD-10-CM

## 2022-01-08 DIAGNOSIS — N136 Pyonephrosis: Secondary | ICD-10-CM | POA: Diagnosis present

## 2022-01-08 DIAGNOSIS — E1165 Type 2 diabetes mellitus with hyperglycemia: Secondary | ICD-10-CM | POA: Diagnosis present

## 2022-01-08 DIAGNOSIS — Z66 Do not resuscitate: Secondary | ICD-10-CM | POA: Diagnosis present

## 2022-01-08 DIAGNOSIS — Z800 Family history of malignant neoplasm of digestive organs: Secondary | ICD-10-CM

## 2022-01-08 DIAGNOSIS — Z86711 Personal history of pulmonary embolism: Secondary | ICD-10-CM

## 2022-01-08 DIAGNOSIS — Y838 Other surgical procedures as the cause of abnormal reaction of the patient, or of later complication, without mention of misadventure at the time of the procedure: Secondary | ICD-10-CM | POA: Diagnosis present

## 2022-01-08 DIAGNOSIS — Z888 Allergy status to other drugs, medicaments and biological substances status: Secondary | ICD-10-CM

## 2022-01-08 DIAGNOSIS — Y842 Radiological procedure and radiotherapy as the cause of abnormal reaction of the patient, or of later complication, without mention of misadventure at the time of the procedure: Secondary | ICD-10-CM | POA: Diagnosis present

## 2022-01-08 DIAGNOSIS — Z85820 Personal history of malignant melanoma of skin: Secondary | ICD-10-CM

## 2022-01-08 DIAGNOSIS — G4733 Obstructive sleep apnea (adult) (pediatric): Secondary | ICD-10-CM | POA: Diagnosis present

## 2022-01-08 DIAGNOSIS — Z7984 Long term (current) use of oral hypoglycemic drugs: Secondary | ICD-10-CM

## 2022-01-08 DIAGNOSIS — Z515 Encounter for palliative care: Secondary | ICD-10-CM

## 2022-01-08 DIAGNOSIS — R6520 Severe sepsis without septic shock: Secondary | ICD-10-CM | POA: Diagnosis present

## 2022-01-08 DIAGNOSIS — L8990 Pressure ulcer of unspecified site, unspecified stage: Secondary | ICD-10-CM | POA: Insufficient documentation

## 2022-01-08 DIAGNOSIS — F05 Delirium due to known physiological condition: Secondary | ICD-10-CM | POA: Diagnosis present

## 2022-01-08 DIAGNOSIS — E1169 Type 2 diabetes mellitus with other specified complication: Secondary | ICD-10-CM | POA: Diagnosis present

## 2022-01-08 DIAGNOSIS — G4731 Primary central sleep apnea: Secondary | ICD-10-CM

## 2022-01-08 DIAGNOSIS — Z880 Allergy status to penicillin: Secondary | ICD-10-CM

## 2022-01-08 DIAGNOSIS — I10 Essential (primary) hypertension: Secondary | ICD-10-CM | POA: Diagnosis present

## 2022-01-08 DIAGNOSIS — A419 Sepsis, unspecified organism: Secondary | ICD-10-CM | POA: Diagnosis present

## 2022-01-08 DIAGNOSIS — N202 Calculus of kidney with calculus of ureter: Secondary | ICD-10-CM | POA: Diagnosis present

## 2022-01-08 DIAGNOSIS — T83122A Displacement of urinary stent, initial encounter: Secondary | ICD-10-CM | POA: Diagnosis present

## 2022-01-08 DIAGNOSIS — Z7901 Long term (current) use of anticoagulants: Secondary | ICD-10-CM

## 2022-01-08 DIAGNOSIS — G9340 Encephalopathy, unspecified: Secondary | ICD-10-CM | POA: Diagnosis present

## 2022-01-08 DIAGNOSIS — N200 Calculus of kidney: Secondary | ICD-10-CM

## 2022-01-08 DIAGNOSIS — R4189 Other symptoms and signs involving cognitive functions and awareness: Secondary | ICD-10-CM | POA: Diagnosis present

## 2022-01-08 DIAGNOSIS — Z7189 Other specified counseling: Secondary | ICD-10-CM

## 2022-01-08 DIAGNOSIS — E440 Moderate protein-calorie malnutrition: Secondary | ICD-10-CM | POA: Diagnosis present

## 2022-01-08 DIAGNOSIS — G9341 Metabolic encephalopathy: Secondary | ICD-10-CM | POA: Diagnosis present

## 2022-01-08 DIAGNOSIS — F0392 Unspecified dementia, unspecified severity, with psychotic disturbance: Secondary | ICD-10-CM | POA: Diagnosis present

## 2022-01-08 DIAGNOSIS — R410 Disorientation, unspecified: Secondary | ICD-10-CM

## 2022-01-08 DIAGNOSIS — Z1152 Encounter for screening for COVID-19: Secondary | ICD-10-CM

## 2022-01-08 DIAGNOSIS — Z8249 Family history of ischemic heart disease and other diseases of the circulatory system: Secondary | ICD-10-CM

## 2022-01-08 LAB — CBC WITH DIFFERENTIAL/PLATELET
Abs Immature Granulocytes: 0.02 10*3/uL (ref 0.00–0.07)
Basophils Absolute: 0.1 10*3/uL (ref 0.0–0.1)
Basophils Relative: 1 %
Eosinophils Absolute: 0.1 10*3/uL (ref 0.0–0.5)
Eosinophils Relative: 2 %
HCT: 40.8 % (ref 39.0–52.0)
Hemoglobin: 13.6 g/dL (ref 13.0–17.0)
Immature Granulocytes: 0 %
Lymphocytes Relative: 15 %
Lymphs Abs: 0.9 10*3/uL (ref 0.7–4.0)
MCH: 30 pg (ref 26.0–34.0)
MCHC: 33.3 g/dL (ref 30.0–36.0)
MCV: 90.1 fL (ref 80.0–100.0)
Monocytes Absolute: 0.4 10*3/uL (ref 0.1–1.0)
Monocytes Relative: 6 %
Neutro Abs: 4.4 10*3/uL (ref 1.7–7.7)
Neutrophils Relative %: 76 %
Platelets: 184 10*3/uL (ref 150–400)
RBC: 4.53 MIL/uL (ref 4.22–5.81)
RDW: 13.8 % (ref 11.5–15.5)
WBC: 5.9 10*3/uL (ref 4.0–10.5)
nRBC: 0 % (ref 0.0–0.2)

## 2022-01-08 LAB — COMPREHENSIVE METABOLIC PANEL
ALT: 30 U/L (ref 0–44)
AST: 23 U/L (ref 15–41)
Albumin: 4 g/dL (ref 3.5–5.0)
Alkaline Phosphatase: 65 U/L (ref 38–126)
Anion gap: 8 (ref 5–15)
BUN: 20 mg/dL (ref 8–23)
CO2: 27 mmol/L (ref 22–32)
Calcium: 9.6 mg/dL (ref 8.9–10.3)
Chloride: 103 mmol/L (ref 98–111)
Creatinine, Ser: 0.97 mg/dL (ref 0.61–1.24)
GFR, Estimated: >60 mL/min (ref >60)
Glucose, Bld: 205 mg/dL — ABNORMAL HIGH (ref 70–99)
Potassium: 3.8 mmol/L (ref 3.5–5.1)
Sodium: 138 mmol/L (ref 135–145)
Total Bilirubin: 0.7 mg/dL (ref 0.3–1.2)
Total Protein: 7.4 g/dL (ref 6.5–8.1)

## 2022-01-08 LAB — GLUCOSE, CAPILLARY
Glucose-Capillary: 122 mg/dL — ABNORMAL HIGH (ref 70–99)
Glucose-Capillary: 103 mg/dL — ABNORMAL HIGH (ref 70–99)
Glucose-Capillary: 86 mg/dL (ref 70–99)

## 2022-01-08 LAB — TSH: TSH: 3.123 u[IU]/mL (ref 0.350–4.500)

## 2022-01-08 LAB — URINALYSIS, ROUTINE W REFLEX MICROSCOPIC
Bacteria, UA: NONE SEEN
Bilirubin Urine: NEGATIVE
Glucose, UA: 150 mg/dL — AB
Ketones, ur: NEGATIVE mg/dL
Nitrite: NEGATIVE
Protein, ur: 100 mg/dL — AB
RBC / HPF: >50 RBC/hpf — ABNORMAL HIGH (ref 0–5)
Specific Gravity, Urine: 1.011 (ref 1.005–1.030)
pH: 8 (ref 5.0–8.0)

## 2022-01-08 LAB — APTT: aPTT: 34 seconds (ref 24–36)

## 2022-01-08 LAB — MRSA NEXT GEN BY PCR, NASAL: MRSA by PCR Next Gen: NOT DETECTED

## 2022-01-08 LAB — CBG MONITORING, ED: Glucose-Capillary: 187 mg/dL — ABNORMAL HIGH (ref 70–99)

## 2022-01-08 LAB — RESP PANEL BY RT-PCR (FLU A&B, COVID) ARPGX2
Influenza A by PCR: NEGATIVE
Influenza B by PCR: NEGATIVE
SARS Coronavirus 2 by RT PCR: NEGATIVE

## 2022-01-08 LAB — LACTIC ACID, PLASMA
Lactic Acid, Venous: 1 mmol/L (ref 0.5–1.9)
Lactic Acid, Venous: 1.1 mmol/L (ref 0.5–1.9)

## 2022-01-08 LAB — PROCALCITONIN: Procalcitonin: <0.1 ng/mL

## 2022-01-08 LAB — PROTIME-INR
INR: 1.2 (ref 0.8–1.2)
Prothrombin Time: 15.2 seconds (ref 11.4–15.2)

## 2022-01-08 MED ORDER — SODIUM CHLORIDE 0.9 % IV SOLN
2.0000 g | Freq: Once | INTRAVENOUS | Status: AC
  Administered 2022-01-08: 2 g via INTRAVENOUS
  Filled 2022-01-08: qty 12.5

## 2022-01-08 MED ORDER — CHLORHEXIDINE GLUCONATE CLOTH 2 % EX PADS
6.0000 | MEDICATED_PAD | Freq: Every day | CUTANEOUS | Status: DC
  Administered 2022-01-08 – 2022-01-11 (×5): 6 via TOPICAL

## 2022-01-08 MED ORDER — SODIUM CHLORIDE 0.9 % IV BOLUS
1000.0000 mL | Freq: Once | INTRAVENOUS | Status: AC
  Administered 2022-01-08: 1000 mL via INTRAVENOUS

## 2022-01-08 MED ORDER — SODIUM CHLORIDE 0.9 % IV BOLUS (SEPSIS)
500.0000 mL | Freq: Once | INTRAVENOUS | Status: AC
  Administered 2022-01-08: 500 mL via INTRAVENOUS

## 2022-01-08 MED ORDER — INSULIN ASPART 100 UNIT/ML IJ SOLN: 0.0000 [IU] | Freq: Three times a day (TID) | INTRAMUSCULAR | Status: DC

## 2022-01-08 MED ORDER — ONDANSETRON HCL 4 MG/2ML IJ SOLN: 4.0000 mg | Freq: Four times a day (QID) | INTRAMUSCULAR | Status: DC | PRN

## 2022-01-08 MED ORDER — ACETAMINOPHEN 650 MG RE SUPP
650.0000 mg | Freq: Four times a day (QID) | RECTAL | Status: DC | PRN
  Administered 2022-01-08: 650 mg via RECTAL
  Filled 2022-01-08: qty 1

## 2022-01-08 MED ORDER — METRONIDAZOLE 500 MG/100ML IV SOLN
500.0000 mg | Freq: Once | INTRAVENOUS | Status: AC
  Administered 2022-01-08: 500 mg via INTRAVENOUS
  Filled 2022-01-08: qty 100

## 2022-01-08 MED ORDER — SODIUM CHLORIDE 0.9 % IV SOLN
2.0000 g | INTRAVENOUS | Status: DC
  Administered 2022-01-08 – 2022-01-12 (×5): 2 g via INTRAVENOUS
  Filled 2022-01-08 (×5): qty 20

## 2022-01-08 MED ORDER — VANCOMYCIN HCL IN DEXTROSE 1-5 GM/200ML-% IV SOLN: 1000.0000 mg | Freq: Once | INTRAVENOUS | Status: DC

## 2022-01-08 MED ORDER — APIXABAN 5 MG PO TABS: 5.0000 mg | ORAL_TABLET | Freq: Two times a day (BID) | ORAL | Status: DC

## 2022-01-08 MED ORDER — INSULIN ASPART 100 UNIT/ML IJ SOLN: 0.0000 [IU] | Freq: Every day | INTRAMUSCULAR | Status: DC

## 2022-01-08 MED ORDER — ACETAMINOPHEN 325 MG PO TABS: 650.0000 mg | ORAL_TABLET | Freq: Four times a day (QID) | ORAL | Status: DC | PRN

## 2022-01-08 MED ORDER — SODIUM CHLORIDE 0.9 % IV SOLN: INTRAVENOUS | Status: DC

## 2022-01-08 MED ORDER — VANCOMYCIN HCL 1500 MG/300ML IV SOLN
1500.0000 mg | Freq: Once | INTRAVENOUS | Status: AC
  Administered 2022-01-08: 1500 mg via INTRAVENOUS
  Filled 2022-01-08: qty 300

## 2022-01-08 MED ORDER — ONDANSETRON HCL 4 MG PO TABS: 4.0000 mg | ORAL_TABLET | Freq: Four times a day (QID) | ORAL | Status: DC | PRN

## 2022-01-08 NOTE — ED Notes (Signed)
Provider removed stent in penis.

## 2022-01-08 NOTE — ED Notes (Signed)
ED TO INPATIENT HANDOFF REPORT  ED Nurse Name and Phone #: Baxter Flattery, RN  S Name/Age/Gender Eddie Sanders 88 y.o. male Room/Bed: WA17/WA17  Code Status   Code Status: Prior  Home/SNF/Other Skilled nursing facility Patient oriented to: self Is this baseline? No   Triage Complete: Triage complete  Chief Complaint Hypothermia [T68.XXXA]  Triage Note Pt to ED via EMS from [facility] facility. EMS reports that facility called out due to pt having behavioral issues and he was agitated. Pt has hx of dementia and unspecified psychosis. States pt had a urinary stent placed on Monday. He has a string that is out. States pt is leaking urine. EMS states that the string can come out Monday.  EMS reports  BP 127/76 HR 60s O2 97%RA CBG 210   Allergies Allergies  Allergen Reactions   Gabapentin Other (See Comments)    Hallucination   Potassium-Containing Compounds Hives, Diarrhea and Nausea And Vomiting   Benzodiazepines Itching, Swelling and Rash   Penicillins Hives, Swelling and Rash    Level of Care/Admitting Diagnosis ED Disposition     ED Disposition  Admit   Condition  --   Comment  Hospital Area: [place] [[id number]]  Level of Care: Stepdown [14]  Admit to SDU based on following criteria: Severe physiological/psychological symptoms:  Any diagnosis requiring assessment & intervention at least every 4 hours on an ongoing basis to obtain desired patient outcomes including stability and rehabilitation  May place patient in observation at [facility] or [place] if equivalent level of care is available:: No  Covid Evaluation: Asymptomatic - no recent exposure (last 10 days) testing not required  Diagnosis: Hypothermia [[id number]]  Admitting Physician: Jonnie Finner [[id number]]  Attending Physician: Jonnie Finner [[id number]]          B Medical/Surgery History Past Medical History:  Diagnosis Date   Adenomatous colon polyp    Allergic  rhinitis    Arthritis    CHF (congestive heart failure) ([place])    Chronic kidney disease    GERD (gastroesophageal reflux disease)    Hyperlipidemia    Hypertension    Polio 02/[birth year]1937   Post-polio syndrome    Prostate cancer ([place])    Saddle pulmonary embolus ([place])    Skin cancer (melanoma) ([place])    Right eye   Past Surgical History:  Procedure Laterality Date   cataract surgery     bilateral   CYSTOSCOPY WITH RETROGRADE PYELOGRAM, URETEROSCOPY AND STENT PLACEMENT Left 01/06/2022   Procedure: CYSTOSCOPY WITH LEFT RETROGRADE PYELOGRAM, URETEROSCOPY WITH  HOLMIUM LASER  AND STENT PLACEMENT;  Surgeon: Irine Seal, MD;  Location: [facility];  Service: Urology;  Laterality: Left;  1 HR FOR CASE   LUMBAR EPIDURAL INJECTION     LUMBAR LAMINECTOMY/DECOMPRESSION MICRODISCECTOMY Right 07/19/2013   Procedure: LUMBAR LAMINECTOMY/DECOMPRESSION MICRODISCECTOMY 2 LEVELS     lumbar  three/four,  four/five;  Surgeon: Eustace Moore, MD;  Location: [place] [facility];  Service: Neurosurgery;  Laterality: Right;   PROSTATECTOMY     sigmoid colectomy     TONSILLECTOMY       A IV Location/Drains/Wounds Patient Lines/Drains/Airways Status     Active Line/Drains/Airways     Name Placement date Placement time Site Days   Peripheral IV 01/08/22 20 G Right Antecubital 01/08/22  0701  Antecubital  less than 1   Ureteral Drain/Stent Left ureter 6 Fr. 01/06/22  1117  Left ureter  2   External Urinary Catheter 01/08/22  0555  --  less than 1   Incision (Closed) 07/19/13 Back Right 07/19/13  1530  -- 3095            Intake/Output Last 24 hours No intake or output data in the 24 hours ending 01/08/22 1154  Labs/Imaging Results for orders placed or performed during the hospital encounter of 01/08/22 (from the past 48 hour(s))  Comprehensive metabolic panel     Status: Abnormal   Collection Time: 01/08/22  7:00 AM  Result Value Ref Range   Sodium 138 135 - 145 mmol/L   Potassium 3.8 3.5 - 5.1 mmol/L    Chloride 103 98 - 111 mmol/L   CO2 27 22 - 32 mmol/L   Glucose, Bld 205 (H) 70 - 99 mg/dL    Comment: Glucose reference range applies only to samples taken after fasting for at least 8 hours.   BUN 20 8 - 23 mg/dL   Creatinine, Ser 0.97 0.61 - 1.24 mg/dL   Calcium 9.6 8.9 - 10.3 mg/dL   Total Protein 7.4 6.5 - 8.1 g/dL   Albumin 4.0 3.5 - 5.0 g/dL   AST 23 15 - 41 U/L   ALT 30 0 - 44 U/L   Alkaline Phosphatase 65 38 - 126 U/L   Total Bilirubin 0.7 0.3 - 1.2 mg/dL   GFR, Estimated >60 >60 mL/min    Comment: (NOTE) Calculated using the CKD-EPI Creatinine Equation (2021)    Anion gap 8 5 - 15    Comment: Performed at [facility], [place] [address] [place], [place] [address]  CBC with Differential     Status: None   Collection Time: 01/08/22  7:00 AM  Result Value Ref Range   WBC 5.9 4.0 - 10.5 K/uL   RBC 4.53 4.22 - 5.81 MIL/uL   Hemoglobin 13.6 13.0 - 17.0 g/dL   HCT 40.8 39.0 - 52.0 %   MCV 90.1 80.0 - 100.0 fL   MCH 30.0 26.0 - 34.0 pg   MCHC 33.3 30.0 - 36.0 g/dL   RDW 13.8 11.5 - 15.5 %   Platelets 184 150 - 400 K/uL   nRBC 0.0 0.0 - 0.2 %   Neutrophils Relative % 76 %   Neutro Abs 4.4 1.7 - 7.7 K/uL   Lymphocytes Relative 15 %   Lymphs Abs 0.9 0.7 - 4.0 K/uL   Monocytes Relative 6 %   Monocytes Absolute 0.4 0.1 - 1.0 K/uL   Eosinophils Relative 2 %   Eosinophils Absolute 0.1 0.0 - 0.5 K/uL   Basophils Relative 1 %   Basophils Absolute 0.1 0.0 - 0.1 K/uL   Immature Granulocytes 0 %   Abs Immature Granulocytes 0.02 0.00 - 0.07 K/uL    Comment: Performed at [facility], [place] [address] [place], [place] [address]  Urinalysis, Routine w reflex microscopic Urine, Clean Catch     Status: Abnormal   Collection Time: 01/08/22  7:24 AM  Result Value Ref Range   Color, Urine RED (A) YELLOW    Comment: BIOCHEMICALS MAY BE AFFECTED BY COLOR   APPearance CLOUDY (A) CLEAR   Specific Gravity, Urine 1.011 1.005 - 1.030   pH 8.0 5.0 - 8.0    Glucose, UA 150 (A) NEGATIVE mg/dL   Hgb urine dipstick LARGE (A) NEGATIVE   Bilirubin Urine NEGATIVE NEGATIVE   Ketones, ur NEGATIVE NEGATIVE mg/dL   Protein, ur 100 (A) NEGATIVE mg/dL   Nitrite NEGATIVE NEGATIVE   Leukocytes,Ua TRACE (A) NEGATIVE   RBC /  HPF >50 (H) 0 - 5 RBC/hpf   WBC, UA 21-50 0 - 5 WBC/hpf   Bacteria, UA NONE SEEN NONE SEEN    Comment: Performed at [facility], [place] [address], [place], [place] [address]  Lactic acid, plasma     Status: None   Collection Time: 01/08/22  7:30 AM  Result Value Ref Range   Lactic Acid, Venous 1.0 0.5 - 1.9 mmol/L    Comment: Performed at [facility], [place] [address], [place], [place] [address]  Protime-INR     Status: None   Collection Time: 01/08/22  7:30 AM  Result Value Ref Range   Prothrombin Time 15.2 11.4 - 15.2 seconds   INR 1.2 0.8 - 1.2    Comment: (NOTE) INR goal varies based on device and disease states. Performed at [facility], [place] [address], [place], [place] [address]   APTT     Status: None   Collection Time: 01/08/22  7:30 AM  Result Value Ref Range   aPTT 34 24 - 36 seconds    Comment: Performed at [facility], [place] [address] [place], [place] [address]  TSH     Status: None   Collection Time: 01/08/22  7:30 AM  Result Value Ref Range   TSH 3.123 0.350 - 4.500 uIU/mL    Comment: Performed by a 3rd Generation assay with a functional sensitivity of <=0.01 uIU/mL. Performed at [facility], [place] [address], [place], [place] [address]   CBG monitoring, ED     Status: Abnormal   Collection Time: 01/08/22  7:59 AM  Result Value Ref Range   Glucose-Capillary 187 (H) 70 - 99 mg/dL    Comment: Glucose reference range applies only to samples taken after fasting for at least 8 hours.  Resp Panel by RT-PCR (Flu A&B, Covid) Anterior Nasal Swab     Status: None   Collection Time: 01/08/22  8:06 AM   Specimen: Anterior  Nasal Swab  Result Value Ref Range   SARS Coronavirus 2 by RT PCR NEGATIVE NEGATIVE    Comment: (NOTE) SARS-CoV-2 target nucleic acids are NOT DETECTED.  The SARS-CoV-2 RNA is generally detectable in upper respiratory specimens during the acute phase of infection. The lowest concentration of SARS-CoV-2 viral copies this assay can detect is 138 copies/mL. A negative result does not preclude SARS-Cov-2 infection and should not be used as the sole basis for treatment or other patient management decisions. A negative result may occur with  improper specimen collection/handling, submission of specimen other than nasopharyngeal swab, presence of viral mutation(s) within the areas targeted by this assay, and inadequate number of viral copies(<138 copies/mL). A negative result must be combined with clinical observations, patient history, and epidemiological information. The expected result is Negative.  Fact Sheet for Patients:  EntrepreneurPulse.com.au  Fact Sheet for Healthcare Providers:  IncredibleEmployment.be  This test is no t yet approved or cleared by the [place] FDA and  has been authorized for detection and/or diagnosis of SARS-CoV-2 by FDA under an Emergency Use Authorization (EUA). This EUA will remain  in effect (meaning this test can be used) for the duration of the COVID-19 declaration under Section 564(b)(1) of the Act, 21 U.S.C.section 360bbb-3(b)(1), unless the authorization is terminated  or revoked sooner.       Influenza A by PCR NEGATIVE NEGATIVE   Influenza B by PCR NEGATIVE NEGATIVE    Comment: (NOTE) The Xpert Xpress SARS-CoV-2/FLU/RSV plus assay is  intended as an aid in the diagnosis of influenza from Nasopharyngeal swab specimens and should not be used as a sole basis for treatment. Nasal washings and aspirates are unacceptable for Xpert Xpress SARS-CoV-2/FLU/RSV testing.  Fact Sheet for  Patients: EntrepreneurPulse.com.au  Fact Sheet for Healthcare Providers: IncredibleEmployment.be  This test is not yet approved or cleared by the [place] FDA and has been authorized for detection and/or diagnosis of SARS-CoV-2 by FDA under an Emergency Use Authorization (EUA). This EUA will remain in effect (meaning this test can be used) for the duration of the COVID-19 declaration under Section 564(b)(1) of the Act, 21 U.S.C. section 360bbb-3(b)(1), unless the authorization is terminated or revoked.  Performed at [facility], [place] [address], [place], [place] [address]    CT Renal Stone Study  Result Date: 01/08/2022 CLINICAL DATA:  Abdominal/flank pain, stone suspected. EXAM: CT ABDOMEN AND PELVIS WITHOUT CONTRAST TECHNIQUE: Multidetector CT imaging of the abdomen and pelvis was performed following the standard protocol without IV contrast. RADIATION DOSE REDUCTION: This exam was performed according to the departmental dose-optimization program which includes automated exposure control, adjustment of the mA and/or kV according to patient size and/or use of iterative reconstruction technique. COMPARISON:  CT 12/22/2021 and intraoperative fluoroscopy 01/06/2022 FINDINGS: Lower chest: Again noted are small cysts or blebs along the medial right lower lobe. Patchy densities at lung bases are suggestive for atelectasis. No large pleural effusions. Hepatobiliary: Normal appearance of the liver and gallbladder. Pancreas: Unremarkable. No pancreatic ductal dilatation or surrounding inflammatory changes. Spleen: Normal in size without focal abnormality. Adrenals/Urinary Tract: Normal adrenal glands. Stones in the right kidney lower pole, largest measuring 8 mm. Negative for right hydronephrosis. Negative for right ureter stones. Left ureter stent has been placed but the ureter stent has retracted. Proximal aspect of the stent is in the distal  left ureter and this stent is extending out through the urethra. Stent appears to be extending out of the penis based on the topogram image. Left hydronephrosis has markedly decompressed. Evidence for parapelvic cysts or residual dilated calices in left kidney upper pole. Question tiny stones in left kidney lower pole. Distal left ureter stones have been removed. Urinary bladder is decompressed with gas within the bladder. Gas is likely related to recent instrumentation. Stomach/Bowel: Again noted is colon anterior to the liver. Normal appendix without inflammatory changes. No evidence for bowel obstruction and no focal bowel inflammation. Normal appearance of the stomach. Again noted is gas-filled structure near the distal esophagus that measures up to 4.2 cm and likely represents a large distal esophageal diverticulum. Vascular/Lymphatic: Again noted is a prominent mesenteric lymph node measuring up to 1.6 cm on image 38/2. Overall, there is not significant lymph node enlargement in the abdomen or pelvis. Atherosclerotic calcifications in the abdominal aorta without aneurysm. Reproductive: Prostatectomy. Surgical clips in the pelvis and inguinal regions bilaterally. Other: Negative for free fluid.  Negative for free air. Musculoskeletal: Degenerative changes in both hips appear chronic. Multilevel disc disease in the lumbar spine with retrolisthesis at L3-L4. Facet arthropathy in the lumbar spine. No acute bone abnormality. Calcifications or postsurgical changes along the anterior abdominal wall. IMPRESSION: 1. Interval removal of the distal left ureter stones and placement of a left ureter stent. The left ureter stent is partially dislodged with only a small portion remaining in the distal left ureter. 2. Decreased left hydroureteronephrosis. Residual dilated calices in left upper pole and mild dilatation of the left ureter. 3. Nonobstructive right renal calculi. Probable tiny  left renal calculi. 4. Prominent  distal esophageal diverticulum without acute inflammatory changes. 5. Prostatectomy. Electronically Signed   By: Markus Daft M.D.   On: 01/08/2022 09:19   CT Head Wo Contrast  Result Date: 01/08/2022 CLINICAL DATA:  Altered mental status. Patient screaming and yelling at nursing home. EXAM: CT HEAD WITHOUT CONTRAST TECHNIQUE: Contiguous axial images were obtained from the base of the skull through the vertex without intravenous contrast. RADIATION DOSE REDUCTION: This exam was performed according to the departmental dose-optimization program which includes automated exposure control, adjustment of the mA and/or kV according to patient size and/or use of iterative reconstruction technique. COMPARISON:  CT head without contrast 06/23/2021 FINDINGS: Brain: Mild atrophy and moderate white matter changes are stable. No acute infarct, hemorrhage, or mass lesion is present. Deep brain nuclei are within normal limits. The ventricles are of normal size. No significant extraaxial fluid collection is present. The brainstem and cerebellum are within normal limits. Vascular: No hyperdense vessel or unexpected calcification. Skull: Calvarium is intact. No focal lytic or blastic lesions are present. No significant extracranial soft tissue lesion is present. Sinuses/Orbits: The paranasal sinuses and mastoid air cells are clear. Bilateral lens replacements are noted. Globes and orbits are otherwise unremarkable. IMPRESSION: 1. No acute intracranial abnormality or significant interval change. 2. Stable atrophy and moderate white matter disease. This likely reflects the sequela of chronic microvascular ischemia. Electronically Signed   By: San Morelle M.D.   On: 01/08/2022 08:05   DG Chest Port 1 View  Result Date: 01/08/2022 CLINICAL DATA:  Altered mental status. EXAM: PORTABLE CHEST 1 VIEW COMPARISON:  One-view chest x-ray 08/16/2021 FINDINGS: Heart size is exaggerated by low lung volumes. Atherosclerotic  calcifications are present at the aortic arch. Lungs are clear. No edema or effusion is present. Degenerative changes are again noted in the shoulders. IMPRESSION: 1. Low lung volumes. 2. No acute cardiopulmonary disease. Electronically Signed   By: San Morelle M.D.   On: 01/08/2022 08:00    Pending Labs Unresulted Labs (From admission, onward)     Start     Ordered   01/08/22 0731  Urine Culture  (Undifferentiated presentation (screening labs and basic nursing orders))  ONCE - URGENT,   URGENT       Question:  Indication  Answer:  Sepsis   01/08/22 0730   01/08/22 0730  Lactic acid, plasma  Now then every 2 hours,   R (with STAT occurrences)      01/08/22 0730   01/08/22 0730  Culture, blood (routine x 2)  BLOOD CULTURE X 2,   R (with STAT occurrences)      01/08/22 0730            Vitals/Pain Today's Vitals   01/08/22 1000 01/08/22 1015 01/08/22 1030 01/08/22 1100  BP: (!) 184/103 (!) 173/96 (!) 160/94 (!) 170/112  Pulse: 87 87  89  Resp: 17 17 (!) 24 18  Temp:  (!) 94.5 F (34.7 C)    TempSrc:  Rectal    SpO2: 97% 97%  97%  Weight:      Height:        Isolation Precautions No active isolations  Medications Medications  vancomycin (VANCOREADY) IVPB 1500 mg/300 mL (1,500 mg Intravenous New Bag/Given 01/08/22 0959)  ceFEPIme (MAXIPIME) 2 g in sodium chloride 0.9 % 100 mL IVPB (has no administration in time range)  metroNIDAZOLE (FLAGYL) IVPB 500 mg (0 mg Intravenous Stopped 01/08/22 1050)  sodium chloride 0.9 % bolus 500 mL (  500 mLs Intravenous New Bag/Given 01/08/22 0900)    Mobility walks with device High fall risk   Focused Assessments Cardiac Assessment Handoff:    No results found for: "CKTOTAL", "CKMB", "CKMBINDEX", "TROPONINI" Lab Results  Component Value Date   DDIMER 0.77 (H) 08/16/2021   Does the Patient currently have chest pain? No    R Recommendations: See Admitting Provider Note  Report given to:   Additional Notes:

## 2022-01-08 NOTE — ED Notes (Signed)
Unable to obtain initial temp due to pt status. Attempted to obtain axillary reading unsuccessfully. RN aware. Will attempt rectal temp.

## 2022-01-08 NOTE — ED Notes (Signed)
Cooling blanket applied, temp set to 98.7.

## 2022-01-08 NOTE — ED Provider Notes (Signed)
MSE was initiated and I personally evaluated the patient and placed orders (if any) at  6:53 AM on January 08, 2022.  The patient appears stable so that the remainder of the MSE may be completed by another provider.  Patient from nursing home transferred because of agitation, had placement of a ureteral stent on 11/21.  Currently, he is sleeping and not agitated.  I have ordered screening labs of CBC, comprehensive metabolic panel, urinalysis, chest x-ray.   Delora Fuel, MD 65/46/50 [phone]

## 2022-01-08 NOTE — ED Provider Notes (Addendum)
[facility] DEPT Provider Note   CSN: [id number] Arrival date & time: 01/08/22  0530     History  Chief Complaint  Patient presents with   Altered Mental Status    Eddie Sanders is a 88 y.o. male.  Patient is a 88 year old male who presents with agitation.  He has a history of dementia so history is limited.  Per chart review and EMS notes, patient recently had a ureteral stent placed due to kidney stones 2 days ago.  He was noted to have some agitation and apparently pulled on the stent.  No reported recent fevers or vomiting.       Home Medications Prior to Admission medications   Medication Sig Start Date End Date Taking? Authorizing Provider  acetaminophen (TYLENOL) 500 MG tablet Take 1,000 mg by mouth at bedtime.    [provider]  denosumab (PROLIA) 60 MG/ML SOSY injection Inject 60 mg into the skin every 6 (six) months.    [provider]  ELIQUIS 5 MG TABS tablet Take 5 mg by mouth 2 (two) times daily. 09/08/21   [provider]  HYDROcodone-acetaminophen (NORCO/VICODIN) 5-325 MG tablet Take 1 tablet by mouth every 6 (six) hours as needed for moderate pain. 01/06/22 01/06/23  Irine Seal, MD  LORazepam (ATIVAN) 0.5 MG tablet Take 0.5 mg by mouth as needed for anxiety.    [provider]  metFORMIN (GLUCOPHAGE-XR) 500 MG 24 hr tablet Take 500 mg by mouth in the morning and at bedtime. 08/08/21   [provider]  simvastatin (ZOCOR) 20 MG tablet Take 20 mg by mouth 3 (three) times a week.    [provider]  valACYclovir (VALTREX) 1000 MG tablet Take 1,000 mg by mouth as needed (onset of fever blister).    [provider]      Allergies    Gabapentin, Potassium-containing compounds, Benzodiazepines, and Penicillins    Review of Systems   Review of Systems  Unable to perform ROS: Dementia    Physical Exam Updated Vital Signs BP (!) 184/103   Pulse 87   Temp (S) (!) 92.2  F (33.4 C) (Rectal)   Resp 17   Ht '5\' 11"'$  (1.803 m)   Wt 79.5 kg   SpO2 97%   BMI 24.46 kg/m  Physical Exam Constitutional:      Appearance: He is well-developed.  HENT:     Head: Normocephalic and atraumatic.  Eyes:     Pupils: Pupils are equal, round, and reactive to light.  Cardiovascular:     Rate and Rhythm: Normal rate and regular rhythm.     Heart sounds: Normal heart sounds.  Pulmonary:     Effort: Pulmonary effort is normal. No respiratory distress.     Breath sounds: Normal breath sounds. No wheezing or rales.  Chest:     Chest wall: No tenderness.  Abdominal:     General: Bowel sounds are normal.     Palpations: Abdomen is soft.     Tenderness: There is no abdominal tenderness. There is no guarding or rebound.  Genitourinary:    Comments: String from the ureteral stent is protruding from the urethral meatus. Musculoskeletal:        General: Normal range of motion.     Cervical back: Normal range of motion and neck supple.  Lymphadenopathy:     Cervical: No cervical adenopathy.  Skin:    General: Skin is warm and dry.     Findings: No rash.  Neurological:     Mental Status: He is alert and oriented to person, place, and time.     ED Results / Procedures / Treatments   Labs (all labs ordered are listed, but only abnormal results are displayed) Labs Reviewed  COMPREHENSIVE METABOLIC PANEL - Abnormal; Notable for the following components:      Result Value   Glucose, Bld 205 (*)    All other components within normal limits  URINALYSIS, ROUTINE W REFLEX MICROSCOPIC - Abnormal; Notable for the following components:   APPearance CLOUDY (*)    Glucose, UA 150 (*)    Hgb urine dipstick LARGE (*)    Protein, ur 100 (*)    Leukocytes,Ua TRACE (*)    RBC / HPF >50 (*)    All other components within normal limits  CBG MONITORING, ED - Abnormal; Notable for the following components:   Glucose-Capillary 187 (*)    All other components within normal limits   RESP PANEL BY RT-PCR (FLU A&B, COVID) ARPGX2  CULTURE, BLOOD (ROUTINE X 2)  CULTURE, BLOOD (ROUTINE X 2)  URINE CULTURE  CBC WITH DIFFERENTIAL/PLATELET  LACTIC ACID, PLASMA  PROTIME-INR  APTT  TSH  LACTIC ACID, PLASMA    EKG EKG Interpretation  Date/Time:  Thursday January 08 2022 07:59:38 EST Ventricular Rate:  79 PR Interval:  233 QRS Duration: 112 QT Interval:  420 QTC Calculation: 482 R Axis:   -16 Text Interpretation: Sinus rhythm Prolonged PR interval Incomplete right bundle branch block Borderline prolonged QT interval Confirmed by Malvin Johns [phone]) on 01/08/2022 10:34:59 AM  Radiology CT Renal Stone Study  Result Date: 01/08/2022 CLINICAL DATA:  Abdominal/flank pain, stone suspected. EXAM: CT ABDOMEN AND PELVIS WITHOUT CONTRAST TECHNIQUE: Multidetector CT imaging of the abdomen and pelvis was performed following the standard protocol without IV contrast. RADIATION DOSE REDUCTION: This exam was performed according to the departmental dose-optimization program which includes automated exposure control, adjustment of the mA and/or kV according to patient size and/or use of iterative reconstruction technique. COMPARISON:  CT 12/22/2021 and intraoperative fluoroscopy 01/06/2022 FINDINGS: Lower chest: Again noted are small cysts or blebs along the medial right lower lobe. Patchy densities at lung bases are suggestive for atelectasis. No large pleural effusions. Hepatobiliary: Normal appearance of the liver and gallbladder. Pancreas: Unremarkable. No pancreatic ductal dilatation or surrounding inflammatory changes. Spleen: Normal in size without focal abnormality. Adrenals/Urinary Tract: Normal adrenal glands. Stones in the right kidney lower pole, largest measuring 8 mm. Negative for right hydronephrosis. Negative for right ureter stones. Left ureter stent has been placed but the ureter stent has retracted. Proximal aspect of the stent is in the distal left ureter and this stent  is extending out through the urethra. Stent appears to be extending out of the penis based on the topogram image. Left hydronephrosis has markedly decompressed. Evidence for parapelvic cysts or residual dilated calices in left kidney upper pole. Question tiny stones in left kidney lower pole. Distal left ureter stones have been removed. Urinary bladder is decompressed with gas within the bladder. Gas is likely related to recent instrumentation. Stomach/Bowel: Again noted is colon anterior to the liver. Normal appendix without inflammatory changes. No evidence for bowel obstruction and no focal bowel inflammation. Normal appearance of the stomach. Again noted is gas-filled structure near the distal esophagus that measures up to 4.2 cm and likely represents a large distal esophageal diverticulum. Vascular/Lymphatic: Again noted is a prominent mesenteric lymph node measuring up to 1.6 cm on image 38/2. Overall, there  is not significant lymph node enlargement in the abdomen or pelvis. Atherosclerotic calcifications in the abdominal aorta without aneurysm. Reproductive: Prostatectomy. Surgical clips in the pelvis and inguinal regions bilaterally. Other: Negative for free fluid.  Negative for free air. Musculoskeletal: Degenerative changes in both hips appear chronic. Multilevel disc disease in the lumbar spine with retrolisthesis at L3-L4. Facet arthropathy in the lumbar spine. No acute bone abnormality. Calcifications or postsurgical changes along the anterior abdominal wall. IMPRESSION: 1. Interval removal of the distal left ureter stones and placement of a left ureter stent. The left ureter stent is partially dislodged with only a small portion remaining in the distal left ureter. 2. Decreased left hydroureteronephrosis. Residual dilated calices in left upper pole and mild dilatation of the left ureter. 3. Nonobstructive right renal calculi. Probable tiny left renal calculi. 4. Prominent distal esophageal diverticulum  without acute inflammatory changes. 5. Prostatectomy. Electronically Signed   By: Markus Daft M.D.   On: 01/08/2022 09:19   CT Head Wo Contrast  Result Date: 01/08/2022 CLINICAL DATA:  Altered mental status. Patient screaming and yelling at nursing home. EXAM: CT HEAD WITHOUT CONTRAST TECHNIQUE: Contiguous axial images were obtained from the base of the skull through the vertex without intravenous contrast. RADIATION DOSE REDUCTION: This exam was performed according to the departmental dose-optimization program which includes automated exposure control, adjustment of the mA and/or kV according to patient size and/or use of iterative reconstruction technique. COMPARISON:  CT head without contrast 06/23/2021 FINDINGS: Brain: Mild atrophy and moderate white matter changes are stable. No acute infarct, hemorrhage, or mass lesion is present. Deep brain nuclei are within normal limits. The ventricles are of normal size. No significant extraaxial fluid collection is present. The brainstem and cerebellum are within normal limits. Vascular: No hyperdense vessel or unexpected calcification. Skull: Calvarium is intact. No focal lytic or blastic lesions are present. No significant extracranial soft tissue lesion is present. Sinuses/Orbits: The paranasal sinuses and mastoid air cells are clear. Bilateral lens replacements are noted. Globes and orbits are otherwise unremarkable. IMPRESSION: 1. No acute intracranial abnormality or significant interval change. 2. Stable atrophy and moderate white matter disease. This likely reflects the sequela of chronic microvascular ischemia. Electronically Signed   By: San Morelle M.D.   On: 01/08/2022 08:05   DG Chest Port 1 View  Result Date: 01/08/2022 CLINICAL DATA:  Altered mental status. EXAM: PORTABLE CHEST 1 VIEW COMPARISON:  One-view chest x-ray 08/16/2021 FINDINGS: Heart size is exaggerated by low lung volumes. Atherosclerotic calcifications are present at the aortic  arch. Lungs are clear. No edema or effusion is present. Degenerative changes are again noted in the shoulders. IMPRESSION: 1. Low lung volumes. 2. No acute cardiopulmonary disease. Electronically Signed   By: San Morelle M.D.   On: 01/08/2022 08:00   DG C-Arm 1-60 Min-No Report  Result Date: 01/06/2022 Fluoroscopy was utilized by the requesting physician.  No radiographic interpretation.   DG C-Arm 1-60 Min-No Report  Result Date: 01/06/2022 Fluoroscopy was utilized by the requesting physician.  No radiographic interpretation.    Procedures Procedures    Medications Ordered in ED Medications  vancomycin (VANCOREADY) IVPB 1500 mg/300 mL (1,500 mg Intravenous New Bag/Given 01/08/22 0959)  ceFEPIme (MAXIPIME) 2 g in sodium chloride 0.9 % 100 mL IVPB (has no administration in time range)  metroNIDAZOLE (FLAGYL) IVPB 500 mg (500 mg Intravenous New Bag/Given 01/08/22 0859)  sodium chloride 0.9 % bolus 500 mL (500 mLs Intravenous New Bag/Given 01/08/22 0900)    ED  Course/ Medical Decision Making/ A&P                           Medical Decision Making Amount and/or Complexity of Data Reviewed Labs: ordered. Radiology: ordered. ECG/medicine tests: ordered.  Risk Prescription drug management. Decision regarding hospitalization.   Patient is a 88 year old male who presents with confusion and agitation.  Initially the report was that he had dementia although when family arrived at bedside, they were able to offer more story and apparently he does not have baseline significant confusion.  He is normally alert and oriented.  He did get some pain medication related to the stent placement and they feel that it is likely the medication that has precipitated his confusion.  This has happened in the past apparently with medication reactions.  He is noted to be markedly hypothermic and initially was treated for potential sepsis with IV antibiotics.  His white count is normal.  His lactate  is normal.  TSH is pending.  Chest x-ray does not show any obvious source of infection.  As interpreted by me and confirmed by the radiologist.  Head CT does not show any intracranial hemorrhage or other acute abnormality.  He had a CT scan of his abdomen pelvis which showed that his stones are gone although there is some dislodgment of the stent in its just partially in the distal ureter.  His urinalysis has some suggestions of infection but may be more reactive to the stent placement in the kidney stones.  I discussed the patient with Dr. Tammi Klippel of urology who advises to go ahead and remove the stent.  This was removed by me in the ED.  I spoke with Dr. Marylyn Ishihara who will admit the patient for further treatment.  CRITICAL CARE Performed by: Malvin Johns Total critical care time: 70 minutes Critical care time was exclusive of separately billable procedures and treating other patients. Critical care was necessary to treat or prevent imminent or life-threatening deterioration. Critical care was time spent personally by me on the following activities: development of treatment plan with patient and/or surrogate as well as nursing, discussions with consultants, evaluation of patient's response to treatment, examination of patient, obtaining history from patient or surrogate, ordering and performing treatments and interventions, ordering and review of laboratory studies, ordering and review of radiographic studies, pulse oximetry and re-evaluation of patient's condition.   Final Clinical Impression(s) / ED Diagnoses Final diagnoses:  Delirium  Hypothermia, initial encounter  Urinary tract infection with hematuria, site unspecified    Rx / DC Orders ED Discharge Orders     None         Malvin Johns, MD 01/08/22 1035    Malvin Johns, MD 01/08/22 1317

## 2022-01-08 NOTE — Progress Notes (Signed)
RT placed pt on Auto BIPAP with 2 LPM bleed in. Pt resting at this time.

## 2022-01-08 NOTE — Progress Notes (Signed)
A consult was received from an ED physician for vanc/cefepime per pharmacy dosing.  The patient's profile has been reviewed for ht/wt/allergies/indication/available labs.   A one time order has been placed for vanc '1500mg'$  and cefepime 2g.  Further antibiotics/pharmacy consults should be ordered by admitting physician if indicated.                       Thank you, Kara Mead 01/08/2022  9:05 AM

## 2022-01-08 NOTE — H&P (Addendum)
History and Physical    Patient: Eddie Sanders RJJ:[id number] DOB: March 11, 1928 DOA: 01/08/2022 DOS: the patient was seen and examined on 01/08/2022 PCP: Crist Infante, MD  Patient coming from: ALF/ILF  Chief Complaint:  Chief Complaint  Patient presents with   Altered Mental Status   HPI: Eddie BROTHERS is a 88 y.o. male with medical history significant of OSA, postpolio syndrome, prostate CA, DM2, HLD, PE. Presenting with altered mental status. History is from son at bedside. He reports that the patient had a recent renal stent placement d/t kidney stones. He was discharged to rehab. He seemed to be doing well until last night when he started becoming agitated. He was confused and hallucinating. He apparently pulled his stent. When his symptoms did not improve, the patient was transferred to the ED by EMS. Per his son, he has noticed a steady decline in the patient's mentation since the spring of this year.  He reports that the patient had a similar episode of encephalopathy back in May. It was thought that gabapentin may have played a role in that event. The son reports that the patient may have had gabapentin a couple of days ago.  Review of Systems: Unable to review all systems due to lack of cooperation from patient. Past Medical History:  Diagnosis Date   Adenomatous colon polyp    Allergic rhinitis    Arthritis    CHF (congestive heart failure) (HCC)    Chronic kidney disease    GERD (gastroesophageal reflux disease)    Hyperlipidemia    Hypertension    Polio 02/16/1937   Post-polio syndrome    Prostate cancer ([place])    Saddle pulmonary embolus (HCC)    Skin cancer (melanoma) ([place])    Right eye   Past Surgical History:  Procedure Laterality Date   cataract surgery     bilateral   CYSTOSCOPY WITH RETROGRADE PYELOGRAM, URETEROSCOPY AND STENT PLACEMENT Left 01/06/2022   Procedure: CYSTOSCOPY WITH LEFT RETROGRADE PYELOGRAM, URETEROSCOPY WITH  HOLMIUM LASER  AND STENT  PLACEMENT;  Surgeon: Irine Seal, MD;  Location: [facility];  Service: Urology;  Laterality: Left;  1 HR FOR CASE   LUMBAR EPIDURAL INJECTION     LUMBAR LAMINECTOMY/DECOMPRESSION MICRODISCECTOMY Right 07/19/2013   Procedure: LUMBAR LAMINECTOMY/DECOMPRESSION MICRODISCECTOMY 2 LEVELS     lumbar  three/four,  four/five;  Surgeon: Eustace Moore, MD;  Location: [place] [facility];  Service: Neurosurgery;  Laterality: Right;   PROSTATECTOMY     sigmoid colectomy     TONSILLECTOMY     Social History:  reports that he has quit smoking. He has never used smokeless tobacco. He reports current alcohol use. He reports that he does not use drugs.  Allergies  Allergen Reactions   Gabapentin Other (See Comments)    Hallucination   Potassium-Containing Compounds Hives, Diarrhea and Nausea And Vomiting   Benzodiazepines Itching, Swelling and Rash   Penicillins Hives, Swelling and Rash    Family History  Problem Relation Age of Onset   Pancreatic cancer Father    Cancer Father    Heart failure Mother    Heart disease Mother    Colon cancer Neg Hx     Prior to Admission medications   Medication Sig Start Date End Date Taking? Authorizing Provider  acetaminophen (TYLENOL) 500 MG tablet Take 1,000 mg by mouth at bedtime.    [provider]  denosumab (PROLIA) 60 MG/ML SOSY injection Inject 60 mg into the skin every 6 (six) months.  [provider]  ELIQUIS 5 MG TABS tablet Take 5 mg by mouth 2 (two) times daily. 09/08/21   [provider]  HYDROcodone-acetaminophen (NORCO/VICODIN) 5-325 MG tablet Take 1 tablet by mouth every 6 (six) hours as needed for moderate pain. 01/06/22 01/06/23  Irine Seal, MD  LORazepam (ATIVAN) 0.5 MG tablet Take 0.5 mg by mouth as needed for anxiety.    [provider]  metFORMIN (GLUCOPHAGE-XR) 500 MG 24 hr tablet Take 500 mg by mouth in the morning and at bedtime. 08/08/21   [provider]  simvastatin (ZOCOR) 20 MG tablet Take 20 mg by  mouth 3 (three) times a week.    [provider]  valACYclovir (VALTREX) 1000 MG tablet Take 1,000 mg by mouth as needed (onset of fever blister).    [provider]    Physical Exam: Vitals:   01/08/22 0945 01/08/22 1000 01/08/22 1015 01/08/22 1030  BP: (!) 182/117 (!) 184/103 (!) 173/96 (!) 160/94  Pulse: 86 87 87   Resp: '19 17 17 '$ (!) 24  Temp:   (!) 94.5 F (34.7 C)   TempSrc:   Rectal   SpO2: 99% 97% 97%   Weight:      Height:       General: 88 y.o. male resting in bed in NAD Eyes: PERRL, normal sclera ENMT: Nares patent w/o discharge, orophaynx clear, dentition normal, ears w/o discharge/lesions/ulcers Neck: Supple, trachea midline Cardiovascular: RRR, +S1, S2, no m/g/r, equal pulses throughout Respiratory: CTABL, no w/r/r, normal WOB GI: BS+, NDNT, no masses noted, no organomegaly noted MSK: No e/c/c Neuro: no focal deficits but he is having difficulty following commands  Data Reviewed:  Results for orders placed or performed during the hospital encounter of 01/08/22 (from the past 24 hour(s))  Comprehensive metabolic panel     Status: Abnormal   Collection Time: 01/08/22  7:00 AM  Result Value Ref Range   Sodium 138 135 - 145 mmol/L   Potassium 3.8 3.5 - 5.1 mmol/L   Chloride 103 98 - 111 mmol/L   CO2 27 22 - 32 mmol/L   Glucose, Bld 205 (H) 70 - 99 mg/dL   BUN 20 8 - 23 mg/dL   Creatinine, Ser 0.97 0.61 - 1.24 mg/dL   Calcium 9.6 8.9 - 10.3 mg/dL   Total Protein 7.4 6.5 - 8.1 g/dL   Albumin 4.0 3.5 - 5.0 g/dL   AST 23 15 - 41 U/L   ALT 30 0 - 44 U/L   Alkaline Phosphatase 65 38 - 126 U/L   Total Bilirubin 0.7 0.3 - 1.2 mg/dL   GFR, Estimated >60 >60 mL/min   Anion gap 8 5 - 15  CBC with Differential     Status: None   Collection Time: 01/08/22  7:00 AM  Result Value Ref Range   WBC 5.9 4.0 - 10.5 K/uL   RBC 4.53 4.22 - 5.81 MIL/uL   Hemoglobin 13.6 13.0 - 17.0 g/dL   HCT 40.8 39.0 - 52.0 %   MCV 90.1 80.0 - 100.0 fL   MCH 30.0 26.0 -  34.0 pg   MCHC 33.3 30.0 - 36.0 g/dL   RDW 13.8 11.5 - 15.5 %   Platelets 184 150 - 400 K/uL   nRBC 0.0 0.0 - 0.2 %   Neutrophils Relative % 76 %   Neutro Abs 4.4 1.7 - 7.7 K/uL   Lymphocytes Relative 15 %   Lymphs Abs 0.9 0.7 - 4.0 K/uL   Monocytes Relative  6 %   Monocytes Absolute 0.4 0.1 - 1.0 K/uL   Eosinophils Relative 2 %   Eosinophils Absolute 0.1 0.0 - 0.5 K/uL   Basophils Relative 1 %   Basophils Absolute 0.1 0.0 - 0.1 K/uL   Immature Granulocytes 0 %   Abs Immature Granulocytes 0.02 0.00 - 0.07 K/uL  Urinalysis, Routine w reflex microscopic Urine, Clean Catch     Status: Abnormal (Preliminary result)   Collection Time: 01/08/22  7:24 AM  Result Value Ref Range   Color, Urine PENDING YELLOW   APPearance CLOUDY (A) CLEAR   Specific Gravity, Urine 1.011 1.005 - 1.030   pH 8.0 5.0 - 8.0   Glucose, UA 150 (A) NEGATIVE mg/dL   Hgb urine dipstick LARGE (A) NEGATIVE   Bilirubin Urine NEGATIVE NEGATIVE   Ketones, ur NEGATIVE NEGATIVE mg/dL   Protein, ur 100 (A) NEGATIVE mg/dL   Nitrite NEGATIVE NEGATIVE   Leukocytes,Ua TRACE (A) NEGATIVE   RBC / HPF >50 (H) 0 - 5 RBC/hpf   WBC, UA 21-50 0 - 5 WBC/hpf   Bacteria, UA NONE SEEN NONE SEEN  Lactic acid, plasma     Status: None   Collection Time: 01/08/22  7:30 AM  Result Value Ref Range   Lactic Acid, Venous 1.0 0.5 - 1.9 mmol/L  Protime-INR     Status: None   Collection Time: 01/08/22  7:30 AM  Result Value Ref Range   Prothrombin Time 15.2 11.4 - 15.2 seconds   INR 1.2 0.8 - 1.2  APTT     Status: None   Collection Time: 01/08/22  7:30 AM  Result Value Ref Range   aPTT 34 24 - 36 seconds  TSH     Status: None   Collection Time: 01/08/22  7:30 AM  Result Value Ref Range   TSH 3.123 0.350 - 4.500 uIU/mL  CBG monitoring, ED     Status: Abnormal   Collection Time: 01/08/22  7:59 AM  Result Value Ref Range   Glucose-Capillary 187 (H) 70 - 99 mg/dL  Resp Panel by RT-PCR (Flu A&B, Covid) Anterior Nasal Swab     Status:  None   Collection Time: 01/08/22  8:06 AM   Specimen: Anterior Nasal Swab  Result Value Ref Range   SARS Coronavirus 2 by RT PCR NEGATIVE NEGATIVE   Influenza A by PCR NEGATIVE NEGATIVE   Influenza B by PCR NEGATIVE NEGATIVE   CXR: 1. Low lung volumes. 2. No acute cardiopulmonary disease.  CTH: 1. No acute intracranial abnormality or significant interval change. 2. Stable atrophy and moderate white matter disease. This likely reflects the sequela of chronic microvascular ischemia.  CT Renal Stone Study 1. Interval removal of the distal left ureter stones and placement of a left ureter stent. The left ureter stent is partially dislodged with only a small portion remaining in the distal left ureter. 2. Decreased left hydroureteronephrosis. Residual dilated calices in left upper pole and mild dilatation of the left ureter. 3. Nonobstructive right renal calculi. Probable tiny left renal calculi. 4. Prominent distal esophageal diverticulum without acute inflammatory changes. 5. Prostatectomy.  EKG: sinus, no st elevation  Assessment and Plan: Acute metabolic encephalopathy     - place in obs, SDU     - medicine related? similar episode in May attributed to gabapentin     - UA shows pyuria, no definitive infection, but he's hypothermic; he was started on broad spec abx in ED; can probably narrow to rocephin; check procal and MRSA;  follow Bld Cx, UCx     - TSH ok     - CTH ok (no focality on exam); will check MRI brain     - delirium precautions  Hx of PE     - continue continue home regimen when confirmed  Hypothermia     - currently on bair hugger     - secondary to infection? Currently on broad spec abx     - TSH ok  Pyuria     - see acute metabolic encephalopathy above  DM2     - DM diet, SSI, glucose check  HLD     - continue home regimen when confirmed  Complex sleep apnea     - BiPap qHS  Advance Care Planning:   Code Status: DNR  Consults: EDP spoke with  urology  Family Communication: w/ son at bedside  Severity of Illness: The appropriate patient status for this patient is INPATIENT. Inpatient status is judged to be reasonable and necessary in order to provide the required intensity of service to ensure the patient's safety. The patient's presenting symptoms, physical exam findings, and initial radiographic and laboratory data in the context of their chronic comorbidities is felt to place them at high risk for further clinical deterioration. Furthermore, it is not anticipated that the patient will be medically stable for discharge from the hospital within 2 midnights of admission.   * I certify that at the point of admission it is my clinical judgment that the patient will require inpatient hospital care spanning beyond 2 midnights from the point of admission due to high intensity of service, high risk for further deterioration and high frequency of surveillance required.*  Author: Jonnie Finner, DO 01/08/2022 11:14 AM  For on call review [email or web address].

## 2022-01-08 NOTE — ED Notes (Signed)
Bair hugger applied due to temp of 91.9 rectal

## 2022-01-08 NOTE — ED Notes (Signed)
Cefepime alerted to allergy to medication, contacted Dr. Talmadge Chad and he advised was okay to give, reported that to nurse receiving report on floor.

## 2022-01-08 NOTE — Progress Notes (Addendum)
Called RT to inform of patient's respiratory status change (snoring, desat into the 80's, bouts of sleep apnea, blood pressure drop, altered mental status). Patient placed on 6LNC. RT at bedside.

## 2022-01-08 NOTE — ED Notes (Signed)
Pt has a urinary stint and is leaking. Male pure wick applied. Be very careful taking off of changing out the pure wick as not to dislodge the stint

## 2022-01-08 NOTE — Progress Notes (Addendum)
Patient's BP recheck was 85/42 after readjusting the patient's cuff/placing patient in trendelenburg position. Patient's oxygenation level dropped into the 80's; placed patient on 6LNC. Called Dr. Marylyn Ishihara with this update.   IV bolus and IV fluid replacement ordered.

## 2022-01-08 NOTE — ED Triage Notes (Signed)
Pt to ED via EMS from [facility]. EMS reports that facility called out due to pt having behavioral issues and he was agitated. Pt has hx of dementia and unspecified psychosis. States pt had a urinary stent placed on Monday. He has a string that is out. States pt is leaking urine. EMS states that the string can come out Monday.  EMS reports  BP 127/76 HR 60s O2 97%RA CBG 210

## 2022-01-09 ENCOUNTER — Other Ambulatory Visit: Payer: Self-pay

## 2022-01-09 ENCOUNTER — Encounter (HOSPITAL_COMMUNITY): Payer: Self-pay | Admitting: Internal Medicine

## 2022-01-09 DIAGNOSIS — Z87891 Personal history of nicotine dependence: Secondary | ICD-10-CM | POA: Diagnosis not present

## 2022-01-09 DIAGNOSIS — N390 Urinary tract infection, site not specified: Secondary | ICD-10-CM | POA: Diagnosis not present

## 2022-01-09 DIAGNOSIS — Z1152 Encounter for screening for COVID-19: Secondary | ICD-10-CM | POA: Diagnosis not present

## 2022-01-09 DIAGNOSIS — F419 Anxiety disorder, unspecified: Secondary | ICD-10-CM | POA: Diagnosis present

## 2022-01-09 DIAGNOSIS — I10 Essential (primary) hypertension: Secondary | ICD-10-CM | POA: Diagnosis present

## 2022-01-09 DIAGNOSIS — R6520 Severe sepsis without septic shock: Secondary | ICD-10-CM | POA: Diagnosis present

## 2022-01-09 DIAGNOSIS — Y738 Miscellaneous gastroenterology and urology devices associated with adverse incidents, not elsewhere classified: Secondary | ICD-10-CM | POA: Diagnosis present

## 2022-01-09 DIAGNOSIS — E1169 Type 2 diabetes mellitus with other specified complication: Secondary | ICD-10-CM

## 2022-01-09 DIAGNOSIS — Y842 Radiological procedure and radiotherapy as the cause of abnormal reaction of the patient, or of later complication, without mention of misadventure at the time of the procedure: Secondary | ICD-10-CM | POA: Diagnosis present

## 2022-01-09 DIAGNOSIS — R410 Disorientation, unspecified: Secondary | ICD-10-CM | POA: Diagnosis not present

## 2022-01-09 DIAGNOSIS — T83122A Displacement of urinary stent, initial encounter: Secondary | ICD-10-CM | POA: Diagnosis present

## 2022-01-09 DIAGNOSIS — L89620 Pressure ulcer of left heel, unstageable: Secondary | ICD-10-CM | POA: Diagnosis present

## 2022-01-09 DIAGNOSIS — Y828 Other medical devices associated with adverse incidents: Secondary | ICD-10-CM | POA: Diagnosis present

## 2022-01-09 DIAGNOSIS — Z86711 Personal history of pulmonary embolism: Secondary | ICD-10-CM

## 2022-01-09 DIAGNOSIS — C61 Malignant neoplasm of prostate: Secondary | ICD-10-CM | POA: Diagnosis present

## 2022-01-09 DIAGNOSIS — E440 Moderate protein-calorie malnutrition: Secondary | ICD-10-CM | POA: Insufficient documentation

## 2022-01-09 DIAGNOSIS — G14 Postpolio syndrome: Secondary | ICD-10-CM

## 2022-01-09 DIAGNOSIS — E11649 Type 2 diabetes mellitus with hypoglycemia without coma: Secondary | ICD-10-CM | POA: Diagnosis not present

## 2022-01-09 DIAGNOSIS — A419 Sepsis, unspecified organism: Secondary | ICD-10-CM | POA: Diagnosis present

## 2022-01-09 DIAGNOSIS — Z515 Encounter for palliative care: Secondary | ICD-10-CM | POA: Diagnosis not present

## 2022-01-09 DIAGNOSIS — N136 Pyonephrosis: Secondary | ICD-10-CM | POA: Diagnosis present

## 2022-01-09 DIAGNOSIS — N200 Calculus of kidney: Secondary | ICD-10-CM

## 2022-01-09 DIAGNOSIS — T83593A Infection and inflammatory reaction due to other urinary stents, initial encounter: Secondary | ICD-10-CM | POA: Diagnosis present

## 2022-01-09 DIAGNOSIS — T68XXXA Hypothermia, initial encounter: Secondary | ICD-10-CM | POA: Diagnosis present

## 2022-01-09 DIAGNOSIS — R4189 Other symptoms and signs involving cognitive functions and awareness: Secondary | ICD-10-CM

## 2022-01-09 DIAGNOSIS — E785 Hyperlipidemia, unspecified: Secondary | ICD-10-CM | POA: Diagnosis present

## 2022-01-09 DIAGNOSIS — Y838 Other surgical procedures as the cause of abnormal reaction of the patient, or of later complication, without mention of misadventure at the time of the procedure: Secondary | ICD-10-CM | POA: Diagnosis present

## 2022-01-09 DIAGNOSIS — Q396 Congenital diverticulum of esophagus: Secondary | ICD-10-CM | POA: Diagnosis not present

## 2022-01-09 DIAGNOSIS — G9341 Metabolic encephalopathy: Secondary | ICD-10-CM | POA: Diagnosis present

## 2022-01-09 DIAGNOSIS — Z888 Allergy status to other drugs, medicaments and biological substances status: Secondary | ICD-10-CM | POA: Diagnosis not present

## 2022-01-09 DIAGNOSIS — Z880 Allergy status to penicillin: Secondary | ICD-10-CM | POA: Diagnosis not present

## 2022-01-09 DIAGNOSIS — N202 Calculus of kidney with calculus of ureter: Secondary | ICD-10-CM | POA: Diagnosis present

## 2022-01-09 DIAGNOSIS — F0392 Unspecified dementia, unspecified severity, with psychotic disturbance: Secondary | ICD-10-CM | POA: Diagnosis present

## 2022-01-09 DIAGNOSIS — F05 Delirium due to known physiological condition: Secondary | ICD-10-CM | POA: Diagnosis present

## 2022-01-09 DIAGNOSIS — Z66 Do not resuscitate: Secondary | ICD-10-CM | POA: Diagnosis present

## 2022-01-09 LAB — CBC
HCT: 34.4 % — ABNORMAL LOW (ref 39.0–52.0)
Hemoglobin: 10.9 g/dL — ABNORMAL LOW (ref 13.0–17.0)
MCH: 30.1 pg (ref 26.0–34.0)
MCHC: 31.7 g/dL (ref 30.0–36.0)
MCV: 95 fL (ref 80.0–100.0)
Platelets: 163 10*3/uL (ref 150–400)
RBC: 3.62 MIL/uL — ABNORMAL LOW (ref 4.22–5.81)
RDW: 14.3 % (ref 11.5–15.5)
WBC: 5.7 10*3/uL (ref 4.0–10.5)
nRBC: 0 % (ref 0.0–0.2)

## 2022-01-09 LAB — GLUCOSE, CAPILLARY
Glucose-Capillary: 64 mg/dL — ABNORMAL LOW (ref 70–99)
Glucose-Capillary: 121 mg/dL — ABNORMAL HIGH (ref 70–99)
Glucose-Capillary: 112 mg/dL — ABNORMAL HIGH (ref 70–99)
Glucose-Capillary: 127 mg/dL — ABNORMAL HIGH (ref 70–99)
Glucose-Capillary: 138 mg/dL — ABNORMAL HIGH (ref 70–99)

## 2022-01-09 LAB — COMPREHENSIVE METABOLIC PANEL
ALT: 24 U/L (ref 0–44)
AST: 19 U/L (ref 15–41)
Albumin: 3 g/dL — ABNORMAL LOW (ref 3.5–5.0)
Alkaline Phosphatase: 46 U/L (ref 38–126)
Anion gap: 9 (ref 5–15)
BUN: 20 mg/dL (ref 8–23)
CO2: 23 mmol/L (ref 22–32)
Calcium: 8.2 mg/dL — ABNORMAL LOW (ref 8.9–10.3)
Chloride: 111 mmol/L (ref 98–111)
Creatinine, Ser: 1.12 mg/dL (ref 0.61–1.24)
GFR, Estimated: >60 mL/min (ref >60)
Glucose, Bld: 84 mg/dL (ref 70–99)
Potassium: 3.9 mmol/L (ref 3.5–5.1)
Sodium: 143 mmol/L (ref 135–145)
Total Bilirubin: 0.5 mg/dL (ref 0.3–1.2)
Total Protein: 5.6 g/dL — ABNORMAL LOW (ref 6.5–8.1)

## 2022-01-09 LAB — PROCALCITONIN: Procalcitonin: <0.1 ng/mL

## 2022-01-09 MED ORDER — DEXTROSE 50 % IV SOLN
INTRAVENOUS | Status: AC
  Administered 2022-01-09: 25 mL
  Filled 2022-01-09: qty 50

## 2022-01-09 MED ORDER — BOOST / RESOURCE BREEZE PO LIQD CUSTOM
1.0000 | Freq: Two times a day (BID) | ORAL | Status: DC
  Administered 2022-01-10: 1 via ORAL

## 2022-01-09 MED ORDER — HALOPERIDOL LACTATE 5 MG/ML IJ SOLN
1.0000 mg | Freq: Four times a day (QID) | INTRAMUSCULAR | Status: DC | PRN
  Administered 2022-01-11 – 2022-01-13 (×2): 1 mg via INTRAMUSCULAR
  Filled 2022-01-09 (×2): qty 1

## 2022-01-09 MED ORDER — HALOPERIDOL 1 MG PO TABS: 1.0000 mg | ORAL_TABLET | Freq: Four times a day (QID) | ORAL | Status: DC | PRN

## 2022-01-09 MED ORDER — PANTOPRAZOLE SODIUM 40 MG PO TBEC
40.0000 mg | DELAYED_RELEASE_TABLET | Freq: Every day | ORAL | Status: DC
  Administered 2022-01-10 – 2022-01-13 (×4): 40 mg via ORAL
  Filled 2022-01-09 (×4): qty 1

## 2022-01-09 MED ORDER — SIMVASTATIN 20 MG PO TABS
20.0000 mg | ORAL_TABLET | ORAL | Status: DC
  Administered 2022-01-12: 20 mg via ORAL
  Filled 2022-01-09: qty 1

## 2022-01-09 MED ORDER — QUETIAPINE FUMARATE 25 MG PO TABS
12.5000 mg | ORAL_TABLET | Freq: Every day | ORAL | Status: DC
  Administered 2022-01-09 – 2022-01-10 (×2): 12.5 mg via ORAL
  Filled 2022-01-09 (×3): qty 1

## 2022-01-09 MED ORDER — ADULT MULTIVITAMIN LIQUID CH
15.0000 mL | Freq: Every day | ORAL | Status: DC
  Administered 2022-01-11 – 2022-01-12 (×2): 15 mL via ORAL
  Filled 2022-01-09 (×4): qty 15

## 2022-01-09 MED ORDER — ENOXAPARIN SODIUM 80 MG/0.8ML IJ SOSY
80.0000 mg | PREFILLED_SYRINGE | Freq: Once | INTRAMUSCULAR | Status: AC
  Administered 2022-01-09: 80 mg via SUBCUTANEOUS
  Filled 2022-01-09: qty 0.8

## 2022-01-09 MED ORDER — ENOXAPARIN SODIUM 80 MG/0.8ML IJ SOSY
80.0000 mg | PREFILLED_SYRINGE | Freq: Two times a day (BID) | INTRAMUSCULAR | Status: DC
  Administered 2022-01-10 (×3): 80 mg via SUBCUTANEOUS
  Filled 2022-01-09 (×3): qty 0.8

## 2022-01-09 MED ORDER — THIAMINE MONONITRATE 100 MG PO TABS
100.0000 mg | ORAL_TABLET | Freq: Every day | ORAL | Status: DC
  Administered 2022-01-10 – 2022-01-13 (×4): 100 mg via ORAL
  Filled 2022-01-09 (×4): qty 1

## 2022-01-09 MED ORDER — DEXTROSE IN LACTATED RINGERS 5 % IV SOLN: INTRAVENOUS | Status: DC

## 2022-01-09 NOTE — Assessment & Plan Note (Signed)
Continue anticoagulation with apixaban. Oxymetry monitoring

## 2022-01-09 NOTE — Assessment & Plan Note (Signed)
Follow with Pt and Ot  Increased mobility.

## 2022-01-09 NOTE — Progress Notes (Signed)
Progress Note   Patient: Eddie Sanders AOZ:[id number] DOB: [birth year] DOA: 01/08/2022     0 DOS: the patient was seen and examined on 01/09/2022   Brief hospital course: Eddie Sanders was admitted to the hospital with the working diagnosis of sepsis due to urinary tract infection.   88 yo male with the past medical history of post polio syndrome, prostate cancer, T2DM, pulmonary embolism, and dyslipidemia who presented with altered mental status. 01/06/22 had 19 mm UVJ stone with obstruction, with hydronephrosis. Underwent cystoscopy with left retrograde pyelogram, had stone extraction and insertion of left double J stent. He was discharged to SNF.  The night prior to the current hospitalization he had acute change in his mentation, becoming agitated and having hallucinations. He pulled his ureteral stent out. EMS was called and he was transported to the ED. On his initial physical examination his blood pressure was 182/117, HR 86, RR 17 to 24, tep 94.5 and 02 saturation 97%. Patient had difficulty following commands, lungs with no rales or rhonchi, no wheezing, heart with S1 and S2 present and rhythmic, abdomen with no distention and non tender, no lower extremity edema, no rashes.   Na 138, K 3,8 Cl 103 bicarbonate 27, glucose 205 bun 20 cr 0,97  Wbc 5,9 hgb 13,6 plt 184  Urine analysis SG 1,011, 100 protein, 150 glucose, >50 rbc, 21-50 wbc.   Head Ct with no acute changes.   Chest radiograph with hypoinflation, no cardiomegaly and no infiltrates, no effusions.  EKG 79 bpm, normal axis, 1st degree AV block with sinus rhythm with no significant ST segment or T wave changes.   Patient was placed on IV antibiotic therapy and IV fluids.   11/24 mentation is improving.   Assessment and Plan: * Sepsis secondary to UTI ([place]) Severe sepsis with acute metabolic encephalopathy (present on admission).  Patient with hemodynamic stability, with systolic blood pressure 962 to 120 mmHg.  Core  temperature has improved to 98, on admission he was hypothermic.  Wbc is 5.7 and cultures are with no growth.   Plan to continue IV antibiotic therapy with ceftriaxone Continue IV fluids with dextrose solutions and balanced electrolyte solutions.  Advance diet with aspiration precautions.  Urology aware of stent being removed, no further intervention at this point.   Acute metabolic encephalopathy His mentation has improved, he continue to be disorientated.  He has mittens bilaterally. Acute delirium on admission.   Plan to continue supportive medical therapy with IV antibiotics for urine infection. Continue IV fluids with dextrose for hypoglycemia.  Resume quetiapine and add as needed haloperidol for agitation, avoid benzodiazepines.  Advance diet, consult Pt and Ot.  Add thiamine and multivitamins.   Type 2 diabetes mellitus with hyperlipidemia (HCC) Hypoglycemia.   Patient with capillary glucose down to 64 this am. Plan to add dextrose to IV fluids and follow hypoglycemia protocol with PRN D50 IV Hold on insulin therapy for now and continue close capillary glucose monitoring.  As outpatient he is on metformin.   History of pulmonary embolism Continue anticoagulation with apixaban. Oxymetry monitoring   Post-polio syndrome Follow with Pt and Ot  Increased mobility.   Renal stones Patient had a recent stent placed on the left, that patient removed.  Urology was contacted on admission and Eddie Sanders with stent being out. No further intervention at this point.   Prostate cancer ([place]) Follow  up as outpatient   Cognitive impairment Progressive decline in mentation per his family over last 6 months. Continue neuro  checks per unit protocol Resume quetiapine and add as needed haloperidol as needed.    Left heal pressure ulcer not able to stage, present on admission, continue with local wound care.       Subjective: Patient is more calm today, he has mittens in both hands.  Responds to simple questions but continue to be disorientated times place and time. He had hypoglycemia this am.   Physical Exam: Vitals:   01/09/22 0600 01/09/22 0700 01/09/22 0716 01/09/22 0824  BP: (!) 115/43     Pulse: 65 62 65 70  Resp: '18 10 15 10  '$ Temp:   98.7 F (37.1 C)   TempSrc:   Rectal   SpO2: 95% 97% 97% 95%  Weight:      Height:       Neurology awake and alert, bilateral mittens in place, orientated only to person, follows simple commands, no agitation  ENT with mild pallor, dry mucous membranes  Cardiovascular with S1 and S2 present and rhythmic with no gallops, rubs or murmurs Respiratory with no rales, rhonchi or wheezing on anterior auscultation  Abdomen with no distention  No lower extremity edema  Data Reviewed:    Family Communication: I spoke with patient's son in law  at the bedside, we talked in detail about patient's condition, plan of care and prognosis and all questions were addressed.   Disposition: Status is: Observation The patient will require care spanning > 2 midnights and should be moved to inpatient because: IV antibiotics and neuro checks   Planned Discharge Destination: Skilled nursing facility      Author: Tawni Millers, MD 01/09/2022 9:53 AM  For on call review [email or web address].

## 2022-01-09 NOTE — Assessment & Plan Note (Addendum)
Severe sepsis with acute metabolic encephalopathy (present on admission).  Patient with hemodynamic stability, with systolic blood pressure 437 to 120 mmHg.  Core temperature has improved to 98, on admission he was hypothermic.  Wbc is 5.7 and cultures are with no growth.   Plan to continue IV antibiotic therapy with ceftriaxone Continue IV fluids with dextrose solutions and balanced electrolyte solutions.  Advance diet with aspiration precautions.  Urology aware of stent being removed, no further intervention at this point.

## 2022-01-09 NOTE — Progress Notes (Signed)
Initial Nutrition Assessment  DOCUMENTATION CODES:  Non-severe (moderate) malnutrition in context of chronic illness  INTERVENTION:  - Continue Regular diet as medically appropriate.  - Boost Breeze po BID, each supplement provides 250 kcal and 9 grams of protein - Encourage intake at all meals. - Continue daily multivitamin and thiamine.   NUTRITION DIAGNOSIS:  Moderate Malnutrition related to chronic illness as evidenced by mild fat depletion, mild muscle depletion.  GOAL:  Patient will meet greater than or equal to 90% of their needs  MONITOR:  PO intake, Supplement acceptance, Weight trends  REASON FOR ASSESSMENT:  Consult Assessment of nutrition requirement/status  ASSESSMENT:  88 y.o. male with medical history significant of OSA, postpolio syndrome, prostate CA, DM2, HLD, PE who presented with altered mental status    Patient noted to be admitted with AMS, in restraints at time of visit. No family at bedside. Patient responded to a few questions with simple answers. Was unsure of a UBW but denied recent changes in weight. Per EMR, weight has been stable/slightly increased over the past 6 months. Pt reported consuming 3 meals a day PTA with good appetite.  Notes current appetite is "alright". Discussed nutrition supplements to support intake.  Medications reviewed and include: MVI, Thiamine, D5 at 62m/hr,   Labs reviewed:  HA1C 8.2 Blood Glucose 64-187 x24 hours   NUTRITION - FOCUSED PHYSICAL EXAM:  Flowsheet Row Most Recent Value  Orbital Region Mild depletion  Upper Arm Region Mild depletion  Thoracic and Lumbar Region Mild depletion  Buccal Region Moderate depletion  Temple Region Mild depletion  Clavicle Bone Region Moderate depletion  Clavicle and Acromion Bone Region Moderate depletion  Scapular Bone Region Unable to assess  Dorsal Hand Unable to assess  Patellar Region Moderate depletion  Anterior Thigh Region Moderate depletion  Posterior Calf Region  Moderate depletion  Edema (RD Assessment) Moderate  Hair Reviewed  Eyes Unable to assess  Mouth Unable to assess  Skin Reviewed  Nails Reviewed       Diet Order:   Diet Order             Diet regular Room service appropriate? Yes; Fluid consistency: Thin  Diet effective now                   EDUCATION NEEDS:  Not appropriate for education at this time  Skin:  Skin Assessment: Skin Integrity Issues: Skin Integrity Issues:: Unstageable Unstageable: L Heel  Last BM:  PTA  Height:  Ht Readings from Last 1 Encounters:  01/08/22 '5\' 11"'$  (1.803 m)   Weight:  Wt Readings from Last 1 Encounters:  01/08/22 83.6 kg    BMI:  Body mass index is 25.71 kg/m.  Estimated Nutritional Needs:  Kcal:  2100-2250 kcals Protein:  90-110 grams Fluid:  >/= 2.1L    ASamson FredericRD, LDN For contact information, refer to [facility]

## 2022-01-09 NOTE — TOC Initial Note (Signed)
Transition of Care [facility]) - Initial/Assessment Note    Patient Details  Name: Eddie Sanders MRN: [id number] Date of Birth: [birth year]  Transition of Care [facility]) CM/SW Contact:    Leeroy Cha, RN Phone Number: 01/09/2022, 7:20 AM  Clinical Narrative:                 Patient with decrease mentation over the last several months.  May need snf placement or lt memory care.  Expected Discharge Plan: Skilled Nursing Facility Barriers to Discharge: Continued Medical Work up   Patient Goals and CMS Choice Patient states their goals for this hospitalization and ongoing recovery are:: unable to state   Choice offered to / list presented to : Adult Children  Expected Discharge Plan and Services Expected Discharge Plan: [place]   Discharge Planning Services: CM Consult   Living arrangements for the past 2 months: [place]                                      Prior Living Arrangements/Services Living arrangements for the past 2 months: [place] Lives with:: Self Patient language and need for interpreter reviewed:: Yes        Need for Family Participation in Patient Care: Yes (Comment) (adult son)     Criminal Activity/Legal Involvement Pertinent to Current Situation/Hospitalization: No - Comment as needed  Activities of Daily Living Home Assistive Devices/Equipment: Eyeglasses ADL Screening (condition at time of admission) Patient's cognitive ability adequate to safely complete daily activities?: Yes Is the patient deaf or have difficulty hearing?: No Does the patient have difficulty seeing, even when wearing glasses/contacts?: Yes Does the patient have difficulty concentrating, remembering, or making decisions?: Yes Patient able to express need for assistance with ADLs?: Yes Does the patient have difficulty dressing or bathing?: Yes Independently performs ADLs?: No Does the patient have difficulty walking or  climbing stairs?: Yes Weakness of Legs: Both Weakness of Arms/Hands: None  Permission Sought/Granted                  Emotional Assessment Appearance:: Appears stated age Attitude/Demeanor/Rapport: Unable to Assess Affect (typically observed): Unable to Assess Orientation: : Fluctuating Orientation (Suspected and/or reported Sundowners) Alcohol / Substance Use: Tobacco Use, Alcohol Use Psych Involvement: No (comment)  Admission diagnosis:  Delirium [R41.0] Hypothermia [T68.XXXA] Hypothermia, initial encounter [T68.XXXA] Urinary tract infection with hematuria, site unspecified [N39.0, R31.9] Patient Active Problem List   Diagnosis Date Noted   Hypothermia 01/08/2022   History of pulmonary embolism 01/08/2022   HLD (hyperlipidemia) 01/08/2022   Dependence on bilevel positive airway pressure (BiPAP) ventilation due to central sleep apnea 09/18/2021   Acute saddle pulmonary embolism ([place]) 09/18/2021   Acute encephalopathy 06/23/2021   Uncontrolled type 2 diabetes mellitus with hyperglycemia ([place]) 36/64/4034   Metabolic encephalopathy    Post-polio syndrome    Post-poliomyelitis syndrome 03/05/2021   Non-seasonal allergic rhinitis 03/05/2021   Complex sleep apnea syndrome 10/28/2020   Treatment-emergent central sleep apnea 10/28/2020   Leg pain, diffuse, right 07/08/2020   Postpoliomyelitis muscular atrophy 07/08/2020   Excessive daytime sleepiness 07/08/2020   Excessive postexertional fatigue 07/08/2020   PLMD (periodic limb movement disorder) 07/08/2020   Right epiretinal membrane 01/31/2020   Pseudophakia, both eyes 01/31/2020   Posterior capsular opacification visually significant of left eye 01/31/2020   History of vitrectomy 01/31/2020   Posterior vitreous detachment of left eye 01/31/2020  S/P lumbar laminectomy 07/19/2013   PCP:  Crist Infante, MD Pharmacy:   [id number] [place], [place] [address] [place] [address] Phone: [phone] Fax: [phone]     Social Determinants of Health ([place]) Interventions    Readmission Risk Interventions   No data to display

## 2022-01-09 NOTE — Assessment & Plan Note (Signed)
Follow up as outpatient.  

## 2022-01-09 NOTE — Evaluation (Addendum)
SLP Cancellation Note  Patient Details Name: Eddie Sanders MRN: [id number] DOB: February 06, 1929   Cancelled treatment:       Reason Eval/Treat Not Completed: Other (comment);Patient declined, no reason specified (pt refusing all po trial options provided, son in law present and states he is uncertain if pt has had po since admitted; pt with low vocal intensity but not congested, will continue efforts; turned on opera music for pt as he has been agitated.)  Kathleen Lime, MS [place] Office [phone] Pager [phone]  Macario Golds 01/09/2022, 3:44 PM

## 2022-01-09 NOTE — Hospital Course (Addendum)
Eddie Sanders was admitted to the hospital with the working diagnosis of sepsis due to urinary tract infection.   88 yo male with the past medical history of post polio syndrome, prostate cancer, T2DM, pulmonary embolism, and dyslipidemia who presented with altered mental status. 01/06/22 had 19 mm UVJ stone with obstruction, with hydronephrosis. Underwent cystoscopy with left retrograde pyelogram, had stone extraction and insertion of left double J stent. He was discharged to SNF.  The night prior to the current hospitalization he had acute change in his mentation, becoming agitated and having hallucinations. He pulled his ureteral stent out. EMS was called and he was transported to the ED. On his initial physical examination his blood pressure was 182/117, HR 86, RR 17 to 24, tep 94.5 and 02 saturation 97%. Patient had difficulty following commands, lungs with no rales or rhonchi, no wheezing, heart with S1 and S2 present and rhythmic, abdomen with no distention and non tender, no lower extremity edema, no rashes.   Na 138, K 3,8 Cl 103 bicarbonate 27, glucose 205 bun 20 cr 0,97  Wbc 5,9 hgb 13,6 plt 184  Urine analysis SG 1,011, 100 protein, 150 glucose, >50 rbc, 21-50 wbc.   Head Ct with no acute changes.   Chest radiograph with hypoinflation, no cardiomegaly and no infiltrates, no effusions.  EKG 79 bpm, normal axis, 1st degree AV block with sinus rhythm with no significant ST segment or T wave changes.   Patient was placed on IV antibiotic therapy and IV fluids.   11/24 mentation is improving.

## 2022-01-09 NOTE — Progress Notes (Signed)
Patient's AM CBG was 64, notified Dr. Cathlean Sauer, gave 25 mL per protocol, requested IV gtt with dextrose. Per Dr. Beaulah Dinning, IV gtt was changed from NS to [facility] as the patient is not eating/drinking at this time.

## 2022-01-09 NOTE — Progress Notes (Signed)
This nurse attempted to evaluate the patient drinking water. The patient had a difficult time sucking water up through the straw and immediately spit the water on the floor. Patient refuses to eat/drink/take any PO pills. Informed Dr. Cathlean Sauer and requested pharmacy to switch Thiamine to IV.

## 2022-01-09 NOTE — Assessment & Plan Note (Addendum)
His mentation has improved, he continue to be disorientated.  He has mittens bilaterally. Acute delirium on admission.   Plan to continue supportive medical therapy with IV antibiotics for urine infection. Continue IV fluids with dextrose for hypoglycemia.  Resume quetiapine and add as needed haloperidol for agitation, avoid benzodiazepines.  Advance diet, consult Pt and Ot.  Add thiamine and multivitamins.

## 2022-01-09 NOTE — Assessment & Plan Note (Signed)
Progressive decline in mentation per his family over last 6 months. Continue neuro checks per unit protocol Resume quetiapine and add as needed haloperidol as needed.

## 2022-01-09 NOTE — Assessment & Plan Note (Signed)
Patient had a recent stent placed on the left, that patient removed.  Urology was contacted on admission and [place] with stent being out. No further intervention at this point.

## 2022-01-09 NOTE — Assessment & Plan Note (Signed)
Hypoglycemia.   Patient with capillary glucose down to 64 this am. Plan to add dextrose to IV fluids and follow hypoglycemia protocol with PRN D50 IV Hold on insulin therapy for now and continue close capillary glucose monitoring.  As outpatient he is on metformin.

## 2022-01-10 DIAGNOSIS — N390 Urinary tract infection, site not specified: Secondary | ICD-10-CM | POA: Diagnosis not present

## 2022-01-10 DIAGNOSIS — A419 Sepsis, unspecified organism: Secondary | ICD-10-CM | POA: Diagnosis not present

## 2022-01-10 LAB — GLUCOSE, CAPILLARY
Glucose-Capillary: 144 mg/dL — ABNORMAL HIGH (ref 70–99)
Glucose-Capillary: 148 mg/dL — ABNORMAL HIGH (ref 70–99)
Glucose-Capillary: 118 mg/dL — ABNORMAL HIGH (ref 70–99)
Glucose-Capillary: 127 mg/dL — ABNORMAL HIGH (ref 70–99)

## 2022-01-10 LAB — URINE CULTURE: Culture: 7000 — AB

## 2022-01-10 LAB — CBC
HCT: 32.2 % — ABNORMAL LOW (ref 39.0–52.0)
Hemoglobin: 10.7 g/dL — ABNORMAL LOW (ref 13.0–17.0)
MCH: 31.2 pg (ref 26.0–34.0)
MCHC: 33.2 g/dL (ref 30.0–36.0)
MCV: 93.9 fL (ref 80.0–100.0)
Platelets: 145 10*3/uL — ABNORMAL LOW (ref 150–400)
RBC: 3.43 MIL/uL — ABNORMAL LOW (ref 4.22–5.81)
RDW: 14.3 % (ref 11.5–15.5)
WBC: 5.3 10*3/uL (ref 4.0–10.5)
nRBC: 0 % (ref 0.0–0.2)

## 2022-01-10 LAB — BASIC METABOLIC PANEL
Anion gap: 8 (ref 5–15)
BUN: 20 mg/dL (ref 8–23)
CO2: 23 mmol/L (ref 22–32)
Calcium: 8.2 mg/dL — ABNORMAL LOW (ref 8.9–10.3)
Chloride: 111 mmol/L (ref 98–111)
Creatinine, Ser: 0.86 mg/dL (ref 0.61–1.24)
GFR, Estimated: >60 mL/min (ref >60)
Glucose, Bld: 148 mg/dL — ABNORMAL HIGH (ref 70–99)
Potassium: 3.4 mmol/L — ABNORMAL LOW (ref 3.5–5.1)
Sodium: 142 mmol/L (ref 135–145)

## 2022-01-10 LAB — MAGNESIUM: Magnesium: 1.6 mg/dL — ABNORMAL LOW (ref 1.7–2.4)

## 2022-01-10 NOTE — Progress Notes (Signed)
PT Cancellation Note  Patient Details Name: Eddie Sanders MRN: [id number] DOB: 1928/06/10   Cancelled Treatment:     PT order received but eval deferred based on input from nursing.  Per family, pt was only pivoting bed<>chair prior to admit and palliative care meeting is set for Monday.  Will defer PT eval/intervention until palliative consult completed.   Aujanae Mccullum 01/10/2022, 1:52 PM

## 2022-01-10 NOTE — Progress Notes (Signed)
OT Cancellation Note  Patient Details Name: Eddie Sanders MRN: [id number] DOB: 1928/07/19   Cancelled Treatment:    Reason Eval/Treat Not Completed: Patient not medically ready  Patient is currently on Bairhugger at this time. Nurse reporting discussion with family earlier the following. Per family, pt was only pivoting bed<>chair prior to admit and palliative care meeting is set for Monday.  Will defer OT eval/intervention until palliative consult completed.  Rennie Plowman, MS Acute Rehabilitation Department Office# [phone]  01/10/2022, 2:55 PM

## 2022-01-10 NOTE — Evaluation (Addendum)
Clinical/Bedside Swallow Evaluation Patient Details  Name: Eddie Sanders MRN: [id number] Date of Birth: 1928-06-26  Today's Date: 01/10/2022 Time: SLP Start Time (ACUTE ONLY): 90 SLP Stop Time (ACUTE ONLY): 1745 SLP Time Calculation (min) (ACUTE ONLY): 10 min  Past Medical History:  Past Medical History:  Diagnosis Date   Adenomatous colon polyp    Allergic rhinitis    Arthritis    CHF (congestive heart failure) ([place])    Chronic kidney disease    GERD (gastroesophageal reflux disease)    Hyperlipidemia    Hypertension    Polio 02/16/1937   Post-polio syndrome    Prostate cancer ([place])    Saddle pulmonary embolus ([place])    Skin cancer (melanoma) ([place])    Right eye   Past Surgical History:  Past Surgical History:  Procedure Laterality Date   cataract surgery     bilateral   CYSTOSCOPY WITH RETROGRADE PYELOGRAM, URETEROSCOPY AND STENT PLACEMENT Left 01/06/2022   Procedure: CYSTOSCOPY WITH LEFT RETROGRADE PYELOGRAM, URETEROSCOPY WITH  HOLMIUM LASER  AND STENT PLACEMENT;  Surgeon: Irine Seal, MD;  Location: [facility];  Service: Urology;  Laterality: Left;  1 HR FOR CASE   LUMBAR EPIDURAL INJECTION     LUMBAR LAMINECTOMY/DECOMPRESSION MICRODISCECTOMY Right 07/19/2013   Procedure: LUMBAR LAMINECTOMY/DECOMPRESSION MICRODISCECTOMY 2 LEVELS     lumbar  three/four,  four/five;  Surgeon: Eustace Moore, MD;  Location: [facility];  Service: Neurosurgery;  Laterality: Right;   PROSTATECTOMY     sigmoid colectomy     TONSILLECTOMY     HPI:  Per MD note "[name] was admitted to the hospital with the working diagnosis of sepsis due to urinary tract infection.   01/06/22 had 19 mm UVJ stone with obstruction, with hydronephrosis. Underwent cystoscopy with left retrograde pyelogram, had stone extraction and insertion of left double J stent. He was discharged to SNF.   The night prior to the current hospitalization he had acute change in his mentation, becoming agitated and having  hallucinations. He pulled his ureteral stent out."  Swallow eval ordered.  Yesterday son in law present and reported pt did not have h/o dysphagia.  He refused po intake with SLP yesterday - and RN reports family fed him some today.    Assessment / Plan / Recommendation  Clinical Impression  Pt awake, clear speech when he does speak - stating "I was" when SLP asked if he was doing ok.  Oral motor exam revealed mild amount of viscous secretions on midline of tongue - Pt allowed SLP to brush his teeth and tongue.  He followed directions and was able to seal lips on oral suction and spoon.  Pt elicited delayed swallow after oral care - suspect reflexive.  No indication of aspiration and clear voice throughout session.  Pt did not want po despite SLP offering several options.  His full dinner tray at bedside.  He does appear protective of his airway overall and given his speech is clear - oral manipulation is likely adequate.  Pt asked SLP  if he was in the [place] - delayed due to mentation but was clear.    Son In [place] (seen  yesterday) denied pt having bulbar symptoms from polio- leg weakness. He does not appear with post-polio related swallwoing difficulties.    Recommend continue diet as tolerated with precautions  - Anticipate his intake will be poor.  SLP will follow up Monday or Tuesday depending on [place] following palliative consult. SLP Visit Diagnosis: Dysphagia, unspecified (R13.10)  Aspiration Risk  Mild aspiration risk;Risk for inadequate nutrition/hydration    Diet Recommendation Thin liquid (as tolerated)   Medication Administration: Other (Comment) (as tolerated) Supervision: Full supervision/cueing for compensatory strategies Compensations: Slow rate;Small sips/bites Postural Changes: Remain upright for at least 30 minutes after po intake;Seated upright at 90 degrees    Other  Recommendations Oral Care Recommendations: Oral care BID    Recommendations for follow up therapy are  one component of a multi-disciplinary discharge planning process, led by the attending physician.  Recommendations may be updated based on patient status, additional functional criteria and insurance authorization.  Follow up Recommendations No SLP follow up      Assistance Recommended at Discharge  Full   Functional Status Assessment Patient has had a recent decline in their functional status and/or demonstrates limited ability to make significant improvements in function in a reasonable and predictable amount of time  Frequency and Duration min 1 x/week  1 week       Prognosis Prognosis for Safe Diet Advancement: Fair Barriers to Reach Goals: Cognitive deficits      Swallow Study   General Date of Onset: 01/10/22 HPI: Per MD note "[name] was admitted to the hospital with the working diagnosis of sepsis due to urinary tract infection.   01/06/22 had 19 mm UVJ stone with obstruction, with hydronephrosis. Underwent cystoscopy with left retrograde pyelogram, had stone extraction and insertion of left double J stent. He was discharged to SNF.   The night prior to the current hospitalization he had acute change in his mentation, becoming agitated and having hallucinations. He pulled his ureteral stent out."  Swallow eval ordered.  Yesterday son in law present and reported pt did not have h/o dysphagia.  He refused po intake with SLP yesterday - and RN reports family fed him some today. Type of Study: Bedside Swallow Evaluation Previous Swallow Assessment: none in epic Diet Prior to this Study: Regular;Thin liquids Temperature Spikes Noted: No (low temp - on bair hugger earlier) Respiratory Status: Room air History of Recent Intubation: No Behavior/Cognition: Alert;Other (Comment) (pt did not allow po to provide intake but allowed oral care) Oral Care Completed by SLP: Yes Oral Cavity - Dentition: Adequate natural dentition Self-Feeding Abilities: Total assist Patient Positioning:  Upright in bed Baseline Vocal Quality: Low vocal intensity Volitional Cough: Cognitively unable to elicit Volitional Swallow:  (swallow observed after oral care - suspect reflexive - not volitional)    Oral/Motor/Sensory Function Overall Oral Motor/Sensory Function: Generalized oral weakness (pt able to seal lips on oral suction and expectorate secretions and water from mouth after SLP brushed his teeth)   Ice Chips Ice chips: Not tested   Thin Liquid Other Comments: delayed swallow noted after oral care - no s/s of aspiration    Nectar Thick Nectar Thick Liquid: Not tested Other Comments: pt declined   Honey Thick Honey Thick Liquid: Not tested Other Comments: pt declines   Puree Puree: Not tested Other Comments: pt declines   Solid     Solid: Not tested Other Comments: pt declined      Macario Golds 01/10/2022,6:18 PM   Kathleen Lime, MS [facility] SLP Acute Rehab Services Office [phone] Pager [phone]

## 2022-01-10 NOTE — Progress Notes (Addendum)
  Progress Note   Patient: Eddie Sanders LJQ:[id number] DOB: 08-Dec-1928 DOA: 01/08/2022     1 DOS: the patient was seen and examined on 01/10/2022   Brief hospital course: Mr. Chrostowski was admitted to the hospital with the working diagnosis of sepsis due to urinary tract infection.  01/06/22 had 19 mm UVJ stone with obstruction, with hydronephrosis. Underwent cystoscopy with left retrograde pyelogram, had stone extraction and insertion of left double J stent. He was discharged to SNF.  The night prior to the current hospitalization he had acute change in his mentation, becoming agitated and having hallucinations. He pulled his ureteral stent out. Slowly getting better.   Assessment and Plan: Sepsis secondary to UTI ([place]) Severe sepsis with acute metabolic encephalopathy (present on admission).  Patient with hemodynamic stability, with systolic blood pressure 219 to 120 mmHg.  Core temperature has improved to 98, on admission he was hypothermic.  Wbc is 5.7 and cultures are with no growth.  -IV antibiotic therapy with ceftriaxone (culture unrevealing- will treat for 5 days)  Acute metabolic encephalopathy -delirium precautions   Type 2 diabetes mellitus with hyperlipidemia (H[facility]) -SSI  History of pulmonary embolism Continue anticoagulation with apixaban.  Post-polio syndrome PT/OT Increased mobility.   Renal stones Patient had a recent stent placed on the left, that patient removed.  Urology was contacted on admission and [place] with stent being out. No further intervention at this point.   Prostate cancer ([place]) Follow  up as outpatient   Cognitive impairment Progressive decline in mentation per his family over last 6 months. Resume quetiapine and add as needed haloperidol as needed.   OSA -QHS bipap  Left heal pressure ulcer not able to stage, present on admission, continue with local wound care.      Subjective:  On Bipap, no overnight events  Physical  Exam: Vitals:   01/10/22 0323 01/10/22 0400 01/10/22 0800 01/10/22 0841  BP:  (!) 120/51 (!) 133/52   Pulse: 64  64 73  Resp: '15 17 17 20  '$ Temp: (!) 97.5 F (36.4 C) 97.7 F (36.5 C) 98.2 F (36.8 C) 98.2 F (36.8 C)  TempSrc:  Rectal Rectal   SpO2: 97%  100% 97%  Weight:      Height:        General: Appearance:     Overweight male in no acute distress     Lungs:     On bipap, respirations unlabored  Heart:    Normal heart rate. Normal rhythm. No murmurs, rubs, or gallops.   MS:   All extremities are intact.   Neurologic:   Will awaken, answer simple questions       Family Communication: no family at bedside.   Disposition: Status is: inpt   Planned Discharge Destination: Skilled nursing facility    Author: Geradine Girt, DO 01/10/2022 9:18 AM  For on call review [email or web address].

## 2022-01-10 NOTE — Progress Notes (Signed)
Pt has arrived to Room 1506. Alert.

## 2022-01-10 NOTE — Progress Notes (Signed)
RT took patient off CPAP. Patient currently tolerating room air well with no distress. Rt will continue to monitor  Saturation 96% RA

## 2022-01-11 DIAGNOSIS — Z515 Encounter for palliative care: Secondary | ICD-10-CM | POA: Diagnosis not present

## 2022-01-11 DIAGNOSIS — R319 Hematuria, unspecified: Secondary | ICD-10-CM

## 2022-01-11 DIAGNOSIS — Z7189 Other specified counseling: Secondary | ICD-10-CM

## 2022-01-11 DIAGNOSIS — N390 Urinary tract infection, site not specified: Secondary | ICD-10-CM

## 2022-01-11 DIAGNOSIS — L8990 Pressure ulcer of unspecified site, unspecified stage: Secondary | ICD-10-CM | POA: Insufficient documentation

## 2022-01-11 DIAGNOSIS — R410 Disorientation, unspecified: Secondary | ICD-10-CM

## 2022-01-11 DIAGNOSIS — A419 Sepsis, unspecified organism: Secondary | ICD-10-CM | POA: Diagnosis not present

## 2022-01-11 LAB — BASIC METABOLIC PANEL
Anion gap: 14 (ref 5–15)
BUN: 17 mg/dL (ref 8–23)
CO2: 22 mmol/L (ref 22–32)
Calcium: 9 mg/dL (ref 8.9–10.3)
Chloride: 105 mmol/L (ref 98–111)
Creatinine, Ser: 0.86 mg/dL (ref 0.61–1.24)
GFR, Estimated: >60 mL/min (ref >60)
Glucose, Bld: 139 mg/dL — ABNORMAL HIGH (ref 70–99)
Potassium: 3.5 mmol/L (ref 3.5–5.1)
Sodium: 141 mmol/L (ref 135–145)

## 2022-01-11 LAB — CBC
HCT: 38.5 % — ABNORMAL LOW (ref 39.0–52.0)
Hemoglobin: 12.9 g/dL — ABNORMAL LOW (ref 13.0–17.0)
MCH: 30.2 pg (ref 26.0–34.0)
MCHC: 33.5 g/dL (ref 30.0–36.0)
MCV: 90.2 fL (ref 80.0–100.0)
Platelets: 183 10*3/uL (ref 150–400)
RBC: 4.27 MIL/uL (ref 4.22–5.81)
RDW: 13.4 % (ref 11.5–15.5)
WBC: 7.7 10*3/uL (ref 4.0–10.5)
nRBC: 0 % (ref 0.0–0.2)

## 2022-01-11 LAB — GLUCOSE, CAPILLARY
Glucose-Capillary: 125 mg/dL — ABNORMAL HIGH (ref 70–99)
Glucose-Capillary: 187 mg/dL — ABNORMAL HIGH (ref 70–99)
Glucose-Capillary: 162 mg/dL — ABNORMAL HIGH (ref 70–99)
Glucose-Capillary: 148 mg/dL — ABNORMAL HIGH (ref 70–99)

## 2022-01-11 MED ORDER — APIXABAN 5 MG PO TABS
5.0000 mg | ORAL_TABLET | Freq: Two times a day (BID) | ORAL | Status: DC
  Administered 2022-01-11 – 2022-01-13 (×5): 5 mg via ORAL
  Filled 2022-01-11 (×5): qty 1

## 2022-01-11 MED ORDER — SODIUM CHLORIDE 0.9 % IV SOLN: INTRAVENOUS | Status: DC

## 2022-01-11 MED ORDER — MELATONIN 3 MG PO TABS
3.0000 mg | ORAL_TABLET | Freq: Every day | ORAL | Status: DC
  Administered 2022-01-11 – 2022-01-12 (×2): 3 mg via ORAL
  Filled 2022-01-11 (×2): qty 1

## 2022-01-11 MED ORDER — QUETIAPINE FUMARATE 25 MG PO TABS
25.0000 mg | ORAL_TABLET | Freq: Every day | ORAL | Status: DC
  Administered 2022-01-11 – 2022-01-12 (×2): 25 mg via ORAL
  Filled 2022-01-11 (×2): qty 1

## 2022-01-11 MED ORDER — MAGNESIUM SULFATE 2 GM/50ML IV SOLN
2.0000 g | Freq: Once | INTRAVENOUS | Status: AC
  Administered 2022-01-11: 2 g via INTRAVENOUS
  Filled 2022-01-11: qty 50

## 2022-01-11 MED ORDER — SODIUM CHLORIDE 0.9 % IV SOLN: INTRAVENOUS | Status: AC

## 2022-01-11 NOTE — Consult Note (Signed)
Consultation Note Date: 01/11/2022   Patient Name: Eddie Sanders  DOB: Apr 01, 1928  MRN: [id number]  Age / Sex: 88 y.o., male   PCP: Crist Infante, MD Referring Physician: Geradine Girt, DO  Reason for Consultation: Establishing goals of care     Chief Complaint/History of Present Illness:   Patient is a 88 year old male with a past medical history of dementia, OSA, postpolio syndrome, prostate cancer, diabetes mellitus type 2, hyperlipidemia, and PE who was admitted on 01/08/2022 for management of altered mental status.  History has been obtained from family due to patient's altered mental status.  Patient had previously been hospitalized on 01/06/2022 where he underwent cystoscopy with left retrograde pyelogram and had stone extraction with insertion of left double-J stent.  He was discharged to SNF at that point though with worsening mentation pulled ureteral stent out.  Since being admitted patient has received management for sepsis secondary to UTI.  Urology contacted though agreed with stent being out at this time.  Palliative care consulted to assist with complex medical decision making.  Extensive review of EMR prior to seeing patient.  Of note patient has ACP documentation in place regarding his directives for medical care when he is unable to make medical decisions for himself.  Patient expressed desire for a natural death and to withhold/withdrawal life-prolonging measures if: he has an incurable or irreversible condition that will result in his death within a relatively short period of time; becomes unconscious and healthcare providers determined that, to high degree of medical certainty, he will never regain consciousness; if he suffers from advanced dementia or any other condition which will result in the substantial loss of his cognitive abilities and his healthcare providers determined that, to a high degree of medical certainty, this loss is not reversible.  Documents also state  that healthcare providers may take reasonable steps to keep him as clean, comfortable, and free of pain as possible so that his dignity is maintained, even if this care may hasten his death.  These documents were completed in 12/2014.  Discussed care with RN during day.  When visiting patient early in the afternoon, appears agitated though able to answer some simple questions.  Still confused and reaching out for things that are not there.  Presented to bedside later in the day when bedside RN informed that patient's wife and only daughter were present at bedside.  Able to visit with them and introduced myself as a member of the palliative care team and my role in patient's care. Spent time getting to know about the patient.  They were able to describe that prior to patient's urological surgery, he had been anticipating an in-depth conversations and eating very well.  Patient was not delirious though has had issues with delirium in the past related to medications or hospitalizations.  Patient does have baseline dementia though again had fairly been participating with wife and independent living.  Patient's status has been deteriorating and he was using a motorized wheelchair to get around and needed assistance with dressing.  During our conversation, patient was becoming more agitated due to the noise so patient's daughter and I stepped outside to further discuss with patient's wife permission.  Daughter, Charolett Bumpers, did states she is patient's medical healthcare power of attorney.  She plans to bring this documentation in to be scanned on file.  Patient already has ACP documents stating desires for natural death.  Daughter described how patient had been in rehab and they had called to  send him back to the hospital as they could not maintain with his delirium and agitation.  Daughter notes patient cannot return to independent living.  Spent extensive time discussing multiple topics including palliative  versus hospice care, aging in place, and supporting decisions regarding medical management moving forward.  Ultimately decided to complete MOST form at that time.  This document will be scanned into patient's EMR.  Original and copies were provided to daughter.  Discussed this MOST form specifies patient is: DNR, wants to pursue comfort focused measures, antibiotics should not be given unless determined for patient's comfort, no IV fluids, and no feeding tubes.  Willing to continue antibiotics at this time in the hospital as do believe it could help with patient's confusion and provide more quality of life time.  Does not wish for patient to return to the hospital in the future as it is too stressful on the patient and patient's wife.  Counseled daughter on need to follow-up with [facility] for coordination of care.  Inquired daughter ask about multiple avenues of support including skilled nursing facility, preferred hospice providers at facility, if palliative pathway support available if not eligible for hospice, etc. Daughter planning to call [facility] tomorrow to follow-up on these matters.  Daughter inquired what is keeping patient in the hospital at this time.  Discussed patient continues to receive IV antibiotics though will inform hospitalist of desire to get patient back to [facility] once safe discharge plan is determined including transitioning to oral antibiotics.  Patient's daughter and wife have discussed belief that patient would rather be back at facility than here in the hospital.  Noted wanting to support this with a safe discharge plan.  All questions answered at that time.  Noted PMT would continue to follow along with patient's care.  Primary Diagnoses  Present on Admission:  History of pulmonary embolism  Type 2 diabetes mellitus with hyperlipidemia ([place])  Sepsis secondary to UTI [facility])  Prostate cancer ([place])  Cognitive impairment  Acute metabolic encephalopathy  Sepsis  ([place])   Palliative Review of Systems: Unable to obtain from patient's secondary to medical status.  Past Medical History:  Diagnosis Date   Adenomatous colon polyp    Allergic rhinitis    Arthritis    CHF (congestive heart failure) (HCC)    Chronic kidney disease    GERD (gastroesophageal reflux disease)    Hyperlipidemia    Hypertension    Polio 02/16/1937   Post-polio syndrome    Prostate cancer ([place])    Saddle pulmonary embolus (HCC)    Skin cancer (melanoma) ([place])    Right eye   Social History   Socioeconomic History   Marital status: Married    Spouse name: Not on file   Number of children: 1   Years of education: Not on file   Highest education level: Not on file  Occupational History   Occupation: retired  Tobacco Use   Smoking status: Former   Smokeless tobacco: Never   Tobacco comments:    has occasional cigar ;stopped cigarettes in 1965  Vaping Use   Vaping Use: Never used  Substance and Sexual Activity   Alcohol use: Yes    Alcohol/week: 0.0 standard drinks of alcohol    Comment: 2 oz scotch, 4 oz wine   Drug use: No   Sexual activity: Not Currently    Birth control/protection: None  Other Topics Concern   Not on file  Social History Narrative   Not on file   Social  Determinants of Health   Financial Resource Strain: Not on file  Food Insecurity: No Food Insecurity (01/09/2022)   Hunger Vital Sign    Worried About Running Out of Food in the Last Year: Never true    Ran Out of Food in the Last Year: Never true  Transportation Needs: No Transportation Needs (01/09/2022)   PRAPARE - Hydrologist (Medical): No    Lack of Transportation (Non-Medical): No  Physical Activity: Not on file  Stress: Not on file  Social Connections: Not on file   Family History  Problem Relation Age of Onset   Pancreatic cancer Father    Cancer Father    Heart failure Mother    Heart disease Mother    Colon cancer Neg Hx     Scheduled Meds:  apixaban  5 mg Oral BID   Chlorhexidine Gluconate Cloth  6 each Topical Daily   feeding supplement  1 Container Oral BID BM   melatonin  3 mg Oral QHS   multivitamin  15 mL Oral Daily   pantoprazole  40 mg Oral Daily   QUEtiapine  25 mg Oral QHS   simvastatin  20 mg Oral Q M,W,F   thiamine  100 mg Oral Daily   Continuous Infusions:  sodium chloride     cefTRIAXone (ROCEPHIN)  IV Stopped (01/10/22 2201)   magnesium sulfate bolus IVPB     PRN Meds:.acetaminophen **OR** acetaminophen, haloperidol **OR** haloperidol lactate, ondansetron **OR** ondansetron (ZOFRAN) IV Allergies  Allergen Reactions   Gabapentin Other (See Comments)    Hallucinations   Norco [Hydrocodone-Acetaminophen] Other (See Comments)    Family feels this causes "cognitive impairment"   Potassium-Containing Compounds Hives, Diarrhea and Nausea And Vomiting   Benzodiazepines Itching, Swelling and Rash   Penicillins Hives, Swelling and Rash   CBC:    Component Value Date/Time   WBC 7.7 01/11/2022 0558   HGB 12.9 (L) 01/11/2022 0558   HCT 38.5 (L) 01/11/2022 0558   PLT 183 01/11/2022 0558   MCV 90.2 01/11/2022 0558   NEUTROABS 4.4 01/08/2022 0700   LYMPHSABS 0.9 01/08/2022 0700   MONOABS 0.4 01/08/2022 0700   EOSABS 0.1 01/08/2022 0700   BASOSABS 0.1 01/08/2022 0700   Comprehensive Metabolic Panel:    Component Value Date/Time   NA 141 01/11/2022 0558   K 3.5 01/11/2022 0558   CL 105 01/11/2022 0558   CO2 22 01/11/2022 0558   BUN 17 01/11/2022 0558   CREATININE 0.86 01/11/2022 0558   GLUCOSE 139 (H) 01/11/2022 0558   CALCIUM 9.0 01/11/2022 0558   AST 19 01/09/2022 0235   ALT 24 01/09/2022 0235   ALKPHOS 46 01/09/2022 0235   BILITOT 0.5 01/09/2022 0235   PROT 5.6 (L) 01/09/2022 0235   ALBUMIN 3.0 (L) 01/09/2022 0235    Physical Exam: Vital Signs: BP (!) 152/94 (BP Location: Left Arm)   Pulse 84   Temp 98 F (36.7 C) (Oral)   Resp 18   Ht '5\' 11"'$  (1.803 m)   Wt 83.7 kg    SpO2 96%   BMI 25.74 kg/m  SpO2: SpO2: 96 % O2 Device: O2 Device: Room Air O2 Flow Rate: O2 Flow Rate (L/min): 2 L/min Intake/output summary:  Intake/Output Summary (Last 24 hours) at 01/11/2022 1009 Last data filed at 01/11/2022 0842 Gross per 24 hour  Intake 460 ml  Output 1050 ml  Net -590 ml   LBM: Last BM Date : 01/08/22 Baseline Weight: Weight: 79.5 kg Most  recent weight: Weight: 83.7 kg  General: NAD, agitated, able to answer simple questions though not consistently (>6 words vocabulary) Eyes: No discharge noted HENT: Dry mucous membranes Cardiovascular: RRR, no edema in LE b/l Respiratory: no increased work of breathing noted, not in respiratory distress Abdomen: not distended Extremities: Moving all extremities spontaneously Skin: Ecchymoses on upper extremities bilaterally Neuro: Confused           Palliative Performance Scale: 40%              Additional Data Reviewed: Recent Labs    01/10/22 0223 01/11/22 0558  WBC 5.3 7.7  HGB 10.7* 12.9*  PLT 145* 183  NA 142 141  BUN 20 17  CREATININE 0.86 0.86    Imaging: CT Renal Stone Study CLINICAL DATA:  Abdominal/flank pain, stone suspected.  EXAM: CT ABDOMEN AND PELVIS WITHOUT CONTRAST  TECHNIQUE: Multidetector CT imaging of the abdomen and pelvis was performed following the standard protocol without IV contrast.  RADIATION DOSE REDUCTION: This exam was performed according to the departmental dose-optimization program which includes automated exposure control, adjustment of the mA and/or kV according to patient size and/or use of iterative reconstruction technique.  COMPARISON:  CT 12/22/2021 and intraoperative fluoroscopy 01/06/2022  FINDINGS: Lower chest: Again noted are small cysts or blebs along the medial right lower lobe. Patchy densities at lung bases are suggestive for atelectasis. No large pleural effusions.  Hepatobiliary: Normal appearance of the liver and gallbladder.  Pancreas:  Unremarkable. No pancreatic ductal dilatation or surrounding inflammatory changes.  Spleen: Normal in size without focal abnormality.  Adrenals/Urinary Tract: Normal adrenal glands. Stones in the right kidney lower pole, largest measuring 8 mm. Negative for right hydronephrosis. Negative for right ureter stones. Left ureter stent has been placed but the ureter stent has retracted. Proximal aspect of the stent is in the distal left ureter and this stent is extending out through the urethra. Stent appears to be extending out of the penis based on the topogram image. Left hydronephrosis has markedly decompressed. Evidence for parapelvic cysts or residual dilated calices in left kidney upper pole. Question tiny stones in left kidney lower pole. Distal left ureter stones have been removed. Urinary bladder is decompressed with gas within the bladder. Gas is likely related to recent instrumentation.  Stomach/Bowel: Again noted is colon anterior to the liver. Normal appendix without inflammatory changes. No evidence for bowel obstruction and no focal bowel inflammation. Normal appearance of the stomach. Again noted is gas-filled structure near the distal esophagus that measures up to 4.2 cm and likely represents a large distal esophageal diverticulum.  Vascular/Lymphatic: Again noted is a prominent mesenteric lymph node measuring up to 1.6 cm on image 38/2. Overall, there is not significant lymph node enlargement in the abdomen or pelvis. Atherosclerotic calcifications in the abdominal aorta without aneurysm.  Reproductive: Prostatectomy. Surgical clips in the pelvis and inguinal regions bilaterally.  Other: Negative for free fluid.  Negative for free air.  Musculoskeletal: Degenerative changes in both hips appear chronic. Multilevel disc disease in the lumbar spine with retrolisthesis at L3-L4. Facet arthropathy in the lumbar spine. No acute bone abnormality. Calcifications or  postsurgical changes along the anterior abdominal wall.  IMPRESSION: 1. Interval removal of the distal left ureter stones and placement of a left ureter stent. The left ureter stent is partially dislodged with only a small portion remaining in the distal left ureter. 2. Decreased left hydroureteronephrosis. Residual dilated calices in left upper pole and mild dilatation of the left ureter.  3. Nonobstructive right renal calculi. Probable tiny left renal calculi. 4. Prominent distal esophageal diverticulum without acute inflammatory changes. 5. Prostatectomy.  Electronically Signed   By: Markus Daft M.D.   On: 01/08/2022 09:19 CT Head Wo Contrast CLINICAL DATA:  Altered mental status. Patient screaming and yelling at nursing home.  EXAM: CT HEAD WITHOUT CONTRAST  TECHNIQUE: Contiguous axial images were obtained from the base of the skull through the vertex without intravenous contrast.  RADIATION DOSE REDUCTION: This exam was performed according to the departmental dose-optimization program which includes automated exposure control, adjustment of the mA and/or kV according to patient size and/or use of iterative reconstruction technique.  COMPARISON:  CT head without contrast 06/23/2021  FINDINGS: Brain: Mild atrophy and moderate white matter changes are stable. No acute infarct, hemorrhage, or mass lesion is present. Deep brain nuclei are within normal limits.  The ventricles are of normal size. No significant extraaxial fluid collection is present.  The brainstem and cerebellum are within normal limits.  Vascular: No hyperdense vessel or unexpected calcification.  Skull: Calvarium is intact. No focal lytic or blastic lesions are present. No significant extracranial soft tissue lesion is present.  Sinuses/Orbits: The paranasal sinuses and mastoid air cells are clear. Bilateral lens replacements are noted. Globes and orbits are otherwise  unremarkable.  IMPRESSION: 1. No acute intracranial abnormality or significant interval change. 2. Stable atrophy and moderate white matter disease. This likely reflects the sequela of chronic microvascular ischemia.  Electronically Signed   By: San Morelle M.D.   On: 01/08/2022 08:05 DG Chest Port 1 View CLINICAL DATA:  Altered mental status.  EXAM: PORTABLE CHEST 1 VIEW  COMPARISON:  One-view chest x-ray 08/16/2021  FINDINGS: Heart size is exaggerated by low lung volumes. Atherosclerotic calcifications are present at the aortic arch. Lungs are clear. No edema or effusion is present. Degenerative changes are again noted in the shoulders.  IMPRESSION: 1. Low lung volumes. 2. No acute cardiopulmonary disease.  Electronically Signed   By: San Morelle M.D.   On: 01/08/2022 08:00    I personally reviewed recent imaging.   Palliative Care Assessment and Plan Summary of Established Goals of Care and Medical Treatment Preferences   Patient is a 88 year old male with a past medical history of dementia, OSA, postpolio syndrome, prostate cancer, diabetes mellitus type 2, hyperlipidemia, and PE who was admitted on 01/08/2022 for management of altered mental status.  History has been obtained from family due to patient's altered mental status.  Patient had previously been hospitalized on 01/06/2022 where he underwent cystoscopy with left retrograde pyelogram and had stone extraction with insertion of left double-J stent.  He was discharged to SNF at that point though with worsening mentation pulled ureteral stent out.  Since being admitted patient has received management for sepsis secondary to UTI.  Urology contacted though agreed with stent being out at this time.  Palliative care consulted to assist with complex medical decision making.  # Complex medical decision making/goals of care  -Patient unable to participate in complex medical decision making secondary to his  medical status.  -Extensive conversation with patient's wife and daughter as described above in HPI.  Completed MOST form today stating patient is: DNR, wants to pursue comfort focused measures, antibiotics should not be given unless determined for patient's comfort, no IV fluids, and no feeding tubes.  Willing to continue antibiotics at this time in the hospital as do believe it could help with patient's confusion and provide more quality of  life time.  Does not wish for patient to return to the hospital in the future as it is too stressful on the patient and patient's wife.  -Daughter going to follow-up with [facility] about care that can be provided there to assist with safe discharge coordination.  Patient likely to return to [facility] either with palliative VERSUS hospice support (needs to be able to meet medicare guidelines for hospice support based on FAST/dementia criteria- borderline currently).   -  Code Status: DNR   # Symptom management  -Delirium in setting of acute medical illness/multifactorial   -Currently has haldol '1mg'$  q6hrs prn   -Currently receiving Seroquel 25 mg nightly.  May need to consider additional dose of Seroquel 12.5 in a.m. to assist with delirium management during the day.  Non-pharmacologic Delirium Precautions  - Frequent re-orientation; update board w/ date, names of treatment team  - Daytime stimulation: Open curtains, turn lights on, and turn on television as tolerated during waking hours  - Nighttime calm - close curtains, turn lights off, and turn off television at 9pm  with minimal interruptions from treatment team during sleeping hours, including avoiding lab draws if possible  - Encourage continued presence of family/friends  - Occupy w/ distractions - e.g. ask them to fold washcloths, busy-board  - Ensure adequate sensorium, to include providing glasses, dentures, and hearing aids to patient as appropriate  - Ensure adequate nutrition and fluid intake  -  Assess and treat pain (incl nonverbal cues); e.g. consider scheduled tylenol  - Assess and treat constipation and urinary retention  - Review medications (addition, deletion, changes can trigger)   # Psycho-social/Spiritual Support:  - Support System: Patient's wife; patient's daughter/HCPOA  # Discharge Planning: Patient had been in independent living at [facility] though that is no longer maintainable for patient's safety.  Will return to [facility], potentially SNF.  Either palliative VS  hospice follow-up at [facility]  Thank you for allowing the palliative care team to participate in the care [place].  Chelsea Aus, DO Palliative Care Provider PMT # [phone]  If patient remains symptomatic despite maximum doses, please call PMT at [phone] between 0700 and 1900. Outside of these hours, please call attending, as PMT does not have night coverage.

## 2022-01-11 NOTE — Plan of Care (Signed)

## 2022-01-11 NOTE — Progress Notes (Signed)
  Progress Note   Patient: Eddie Sanders BHA:[id number] DOB: [birth year] DOA: 01/08/2022     2 DOS: the patient was seen and examined on 01/11/2022   Brief hospital course: [name] was admitted to the hospital with the working diagnosis of sepsis due to urinary tract infection.  01/06/22 had 19 mm UVJ stone with obstruction, with hydronephrosis. Underwent cystoscopy with left retrograde pyelogram, had stone extraction and insertion of left double J stent. He was discharged to SNF.  The night prior to the current hospitalization he had acute change in his mentation, becoming agitated and having hallucinations. He pulled his ureteral stent out. Waxing and waning.     Assessment and Plan: Sepsis secondary to UTI ([place]) Severe sepsis with acute metabolic encephalopathy (present on admission).  Patient with hemodynamic stability, with systolic blood pressure 973 to 120 mmHg.  Core temperature has improved to 98, on admission he was hypothermic.  Wbc is 5.7 and cultures are with no growth.  -IV antibiotic therapy with ceftriaxone (culture unrevealing- will treat for 5 days)  Acute metabolic encephalopathy -delirium precautions   Type 2 diabetes mellitus with hyperlipidemia ([facility]) -SSI  History of pulmonary embolism Continue anticoagulation with apixaban.  Post-polio syndrome PT/OT Increased mobility.   Renal stones Patient had a recent stent placed on the left, that patient removed.  Urology was contacted on admission and [place] with stent being out. No further intervention at this point.   Prostate cancer ([place]) Follow  up as outpatient   Cognitive impairment Progressive decline in mentation per his family over last 6 months. Resume quetiapine and add as needed haloperidol as needed.   OSA -QHS bipap  Left heal pressure ulcer not able to stage, present on admission, continue with local wound care.      Subjective:  Did not sleep well last night  Physical Exam: Vitals:    01/10/22 1834 01/10/22 1930 01/10/22 2128 01/11/22 0502  BP: (!) 175/79 (!) 178/87  (!) 152/94  Pulse: 77 62 64 84  Resp: '14 18 16 18  '$ Temp: 97.6 F (36.4 C) (!) 97.5 F (36.4 C)  98 F (36.7 C)  TempSrc: Oral Oral  Oral  SpO2: 96% 98% 98% 96%  Weight: 83.7 kg     Height: '5\' 11"'$  (1.803 m)        General: Appearance:     Overweight male in no acute distress     Lungs:     respirations unlabored  Heart:    Normal heart rate. Normal rhythm. No murmurs, rubs, or gallops.   MS:   All extremities are intact.   Neurologic:   Awake, alert, speaking in complete sentences but non-sensical        Family Communication: no family at bedside.   Disposition: Status is: inpt   Planned Discharge Destination: Skilled nursing facility    Author: Geradine Girt, DO 01/11/2022 12:09 PM  For on call review [email or web address].

## 2022-01-11 NOTE — Progress Notes (Signed)
Per RN pt did not tolerate CPAP very well last night, pt seems confused at this time.  RN/RT in agreement to hold CPAP for tonight.

## 2022-01-12 DIAGNOSIS — Z515 Encounter for palliative care: Secondary | ICD-10-CM | POA: Diagnosis not present

## 2022-01-12 DIAGNOSIS — N390 Urinary tract infection, site not specified: Secondary | ICD-10-CM | POA: Diagnosis not present

## 2022-01-12 DIAGNOSIS — R410 Disorientation, unspecified: Secondary | ICD-10-CM | POA: Diagnosis not present

## 2022-01-12 DIAGNOSIS — E440 Moderate protein-calorie malnutrition: Secondary | ICD-10-CM

## 2022-01-12 DIAGNOSIS — A419 Sepsis, unspecified organism: Secondary | ICD-10-CM | POA: Diagnosis not present

## 2022-01-12 DIAGNOSIS — R4589 Other symptoms and signs involving emotional state: Secondary | ICD-10-CM

## 2022-01-12 LAB — GLUCOSE, CAPILLARY
Glucose-Capillary: 94 mg/dL (ref 70–99)
Glucose-Capillary: 100 mg/dL — ABNORMAL HIGH (ref 70–99)
Glucose-Capillary: 84 mg/dL (ref 70–99)
Glucose-Capillary: 72 mg/dL (ref 70–99)

## 2022-01-12 NOTE — Progress Notes (Signed)
Daily Progress Note   Patient Name: Eddie Sanders       Date: 01/12/2022 DOB: [birth year]  Age: 88 y.o. MRN#: [id number] Attending Physician: Cristela Felt, MD Primary Care Physician: Crist Infante, MD Admit Date: 01/08/2022 Length of Stay: 3 days  Reason for Consultation/Follow-up: Establishing goals of care  Subjective:   CC: Patient remains confused. Following up with family regarding complex medical decision making.   Subjective:  As per EMR review prior to seeing patient, patient was agitated overnight.  Patient receiving Seroquel 25 mg nightly.  Do not see where patient was provided with as needed Haldol overnight.  Presented to bedside to check on patient.  Patient laying calmly in bed interacting with his wife.  Patient still remains confused.  Daughter and this provider stepped outside to further discuss patient's care.  Daughter has [facility] up with [facility] about [facility] returning to SNF.  [facility] coordinating room discharge with Education officer, museum.  Daughter also noted that [facility] noted preferred provider of [facility] her care.  Discussed [facility] provides both palliative and hospice support so would allow patient to have support no matter what moving forward.  Again discussed [facility] her care will have to evaluate to determine hospice eligibility versus discharging with palliative support and if quickly deteriorates, transition to hospice support.  Daughter acknowledged hearing this.  Spent time discussing focusing on patient's quality of life moving forward.  Daughter is pursuing comfort focused interventions once patient returns to nursing facility.  Discussed letting patient's body lead this journey including not forcing patient to eat if he does not want to.  Right now continuing antibiotics with hope of patient having more time and facility with wife.  Patient was seen by PT/OT today who recommended rehab.  Daughter does not want patient to be admitted to  rehab because she knows he will remain confused and that is what sent him to the hospital in the first place which she wants to avoid.  Acknowledged this and noted would discontinue PT/OT consult.  Daughter was also able to provide copy of HCPOA documentation today.  Scans documents so that these can be placed in patient's EMR.  Daughter is healthcare power of attorney.  Social worker, Gaffer her care, and hospitalist working to coordinate discharge.  All questions answered at that time.  Noted this palliative provider was going on service though PMT would be available if daughter needed; contact information provided.  Daughter voiced appreciation for the conversations. Review of Systems  Objective:   Vital Signs:  BP (!) 143/47 (BP Location: Right Arm)   Pulse 90   Temp 98.5 F (36.9 C) (Oral)   Resp 20   Ht '5\' 11"'$  (1.803 m)   Wt 83.7 kg   SpO2 97%   BMI 25.74 kg/m   Physical Exam: General: NAD, laying in bed, confused Eyes: No discharge noted HENT: Dry mucous membranes Cardiovascular: RRR, no edema in LE b/l Respiratory: no increased work of breathing noted, not in respiratory distress Abdomen: not distended Extremities: Moving all extremities spontaneously Skin: Ecchymoses on upper extremities bilaterally Neuro: Confused    Assessment & Plan:   Assessment: Patient is a 88 year old male with a past medical history of dementia, OSA, postpolio syndrome, prostate cancer, diabetes mellitus type 2, hyperlipidemia, and PE who was admitted on 01/08/2022 for management of altered mental status.  History has been obtained from family due to patient's altered mental status.  Patient had previously been hospitalized on 01/06/2022 where he underwent cystoscopy with left retrograde  pyelogram and had stone extraction with insertion of left double-J stent.  He was discharged to SNF at that point though with worsening mentation pulled ureteral stent out.  Since being admitted patient has  received management for sepsis secondary to UTI.  Urology contacted though agreed with stent being out at this time.  Palliative care consulted to assist with complex medical decision making.   Recommendations and Plan # Complex medical decision making/goals of care                -Patient unable to participate in complex medical decision making secondary to his medical status.                -Daughter assisting with coordination of care. Plan is for patient to return to [facility] for LTC at [facility]. [facility] has been consulted to evaluated for either providing palliative care or hospice support. If patient does discharge with palliative support, they will need to monitor for deterioration and transition to hospice. Not proceeding with PT/OT or rehab placement.   -Daughter, Eddie Sanders, is HCPOA.  -MOST form completed on 11/26 and provided to daughter. Essentially comfort focused care upon return to facility. No further hospitalizations.                 -  Code Status: DNR    # Symptom management                -Delirium in setting of acute medical illness/multifactorial                               -Currently has haldol '1mg'$  q6hrs prn                               -Currently receiving Seroquel 25 mg nightly.  May need to consider additional dose of Seroquel 12.5 in a.m. to assist with delirium management during the day.   Non-pharmacologic Delirium Precautions  - Frequent re-orientation; update board w/ date, names of treatment team  - Daytime stimulation: Open curtains, turn lights on, and turn on television as tolerated during waking hours  - Nighttime calm - close curtains, turn lights off, and turn off television at 9pm  with minimal interruptions from treatment team during sleeping hours, including avoiding lab draws if possible  - Encourage continued presence of family/friends  - Occupy w/ distractions - e.g. ask them to fold washcloths, busy-board  - Ensure adequate sensorium, to include  providing glasses, dentures, and hearing aids to patient as appropriate  - Ensure adequate nutrition and fluid intake  - Assess and treat pain (incl nonverbal cues); e.g. consider scheduled tylenol  - Assess and treat constipation and urinary retention  - Review medications (addition, deletion, changes can trigger)    # Psycho-social/Spiritual Support:  - Support System: Patient's wife; patient's daughter/HCPOA   # Discharge Planning: Will return to [facility] with either palliative or hospice support via [facility]  Discussed with: family, hospitalist, CM  Thank you for allowing the palliative care team to participate in the care Eddie Sanders. As goals for medical care are currently determined, PMT will sign off. Please reach out if PMT can be of further assistance moving forward.   Chelsea Aus, DO Palliative Care Provider PMT # [phone]  This provider spent a total of 50 minutes providing patient's care.  Includes review of EMR, discussing care with  other staff members involved in patient's medical care, obtaining relevant history and information from patient and/or patient's family, and personal review of imaging and lab work. Greater than 50% of the time was spent counseling and coordinating care related to the above assessment and plan.

## 2022-01-12 NOTE — Progress Notes (Signed)
Patient is very combative and fight when RN perform patient care. Patient pulled IV, dressing and equipment, kicking and throw pillows to RN.Patient pulled his bandage out on his arm and made a skin tear, foam is applied.

## 2022-01-12 NOTE — Evaluation (Signed)
Physical Therapy Evaluation Patient Details Name: Eddie Sanders MRN: [id number] DOB: December 13, 1928 Today's Date: 01/12/2022  History of Present Illness  88 yo male admitted with sepsis 2* UTI. Recent ureteral stent placement 01/06/22. Hx of dementia, psychosis, prostate ca, post polio syndrome, DM, PE  Clinical Impression  Limited bed level eval only on today. Limited by pt confusion/hallucination and agitation. Pt tended to keep his eyes closed during session. We were able to remove his mittens but he did not follow commands. He refused participation despite instructions/cues. No family present during session. Pt will likely require a higher level of care after discharge-may need SNF. Will follow on a trial basis to continue to assess patient's ability to participate with therapies.      Recommendations for follow up therapy are one component of a multi-disciplinary discharge planning process, led by the attending physician.  Recommendations may be updated based on patient status, additional functional criteria and insurance authorization.  Follow Up Recommendations Skilled nursing-short term rehab (<3 hours/day) Can patient physically be transported by private vehicle: No    Assistance Recommended at Discharge Frequent or constant Supervision/Assistance  Patient can return home with the following  Two people to help with walking and/or transfers;A lot of help with bathing/dressing/bathroom;Assistance with feeding;Direct supervision/assist for medications management;Assistance with cooking/housework;Direct supervision/assist for financial management    Equipment Recommendations None recommended by PT  Recommendations for Other Services       Functional Status Assessment Patient has had a recent decline in their functional status and/or demonstrates limited ability to make significant improvements in function in a reasonable and predictable amount of time     Precautions / Restrictions  Precautions Precautions: Fall Precaution Comments: agitated/combative; currently in mittens Restrictions Weight Bearing Restrictions: No      Mobility  Bed Mobility               General bed mobility comments: Total +2 to reposition pt in bed. Pt refused mobility. Did not strongly encourage for safety reasons-easily agitated and can be combative    Transfers                        Ambulation/Gait                  Stairs            Wheelchair Mobility    Modified Rankin (Stroke Patients Only)       Balance                                             Pertinent Vitals/Pain Pain Assessment Pain Assessment: Faces Faces Pain Scale: No hurt    Home Living Family/patient expects to be discharged to:: Assisted living Living Arrangements: Spouse/significant other               Home Equipment: Electric scooter;Grab bars - tub/shower      Prior Function Prior Level of Function : Needs assist;Patient poor historian/Family not available             Mobility Comments: Does not ambulate--only uses scooter to get around in last 2 months (info taken from previous admission) ADLs Comments: wife has to help with pulling pants up and down (info taken from previous admission)     Hand Dominance        Extremity/Trunk Assessment  Upper Extremity Assessment Upper Extremity Assessment: Defer to OT evaluation    Lower Extremity Assessment Lower Extremity Assessment: Generalized weakness;Difficult to assess due to impaired cognition       Communication   Communication:  (difficult to understand speech at times; also currently hallucinating)  Cognition Arousal/Alertness:  (pt verbally responds to verbal and tactile stimuli; tends to keep eyes closed) Behavior During Therapy: Restless, Agitated Overall Cognitive Status: History of cognitive impairments - at baseline                                  General Comments: hallucinating. easily agitated        General Comments      Exercises     Assessment/Plan    PT Assessment Patient needs continued PT services  PT Problem List Decreased strength;Decreased cognition;Decreased balance;Decreased mobility;Decreased knowledge of use of DME;Decreased safety awareness       PT Treatment Interventions DME instruction;Therapeutic exercise;Gait training;Balance training;Functional mobility training;Therapeutic activities;Patient/family education    PT Goals (Current goals can be found in the Care Plan section)  Acute Rehab PT Goals Patient Stated Goal: pt unable to state. per chart review, family prefers for pt to return to [facility] once medically ready PT Goal Formulation: Patient unable to participate in goal setting Time For Goal Achievement: 01/26/22 Potential to Achieve Goals: Fair    Frequency Min 2X/week     Co-evaluation               AM-PAC PT "6 Clicks" Mobility  Outcome Measure Help needed turning from your back to your side while in a flat bed without using bedrails?: Total Help needed moving from lying on your back to sitting on the side of a flat bed without using bedrails?: Total Help needed moving to and from a bed to a chair (including a wheelchair)?: Total Help needed standing up from a chair using your arms (e.g., wheelchair or bedside chair)?: Total Help needed to walk in hospital room?: Total Help needed climbing 3-5 steps with a railing? : Total 6 Click Score: 6    End of Session   Activity Tolerance: Treatment limited secondary to agitation Patient left: in bed;with call bell/phone within reach;with bed alarm set (pt in mittens)   PT Visit Diagnosis: Muscle weakness (generalized) (M62.81)    Time: 8563-1497 PT Time Calculation (min) (ACUTE ONLY): 14 min   Charges:   PT Evaluation $PT Eval Low Complexity: 1 Low             Doreatha Massed, PT Acute Rehabilitation  Office:  [phone]

## 2022-01-12 NOTE — Progress Notes (Signed)
Consultation Progress Note   Patient: Eddie Sanders:[id number] DOB: 25-Jul-1928 DOA: 01/08/2022 DOS: the patient was seen and examined on 01/12/2022 Primary service: Evanie Buckle, Manfred Shirts, MD  Brief hospital course: Eddie Sanders was admitted to the hospital with the working diagnosis of sepsis due to urinary tract infection.   88 yo male with the past medical history of post polio syndrome, prostate cancer, T2DM, pulmonary embolism, and dyslipidemia who presented with altered mental status. 01/06/22 had 19 mm UVJ stone with obstruction, with hydronephrosis. Underwent cystoscopy with left retrograde pyelogram, had stone extraction and insertion of left double J stent. He was discharged to SNF.  The night prior to the current hospitalization he had acute change in his mentation, becoming agitated and having hallucinations. He pulled his ureteral stent out. EMS was called and he was transported to the ED. On his initial physical examination his blood pressure was 182/117, HR 86, RR 17 to 24, tep 94.5 and 02 saturation 97%. Patient had difficulty following commands, lungs with no rales or rhonchi, no wheezing, heart with S1 and S2 present and rhythmic, abdomen with no distention and non tender, no lower extremity edema, no rashes.   Na 138, K 3,8 Cl 103 bicarbonate 27, glucose 205 bun 20 cr 0,97  Wbc 5,9 hgb 13,6 plt 184  Urine analysis SG 1,011, 100 protein, 150 glucose, >50 rbc, 21-50 wbc.   Head Ct with no acute changes.   Chest radiograph with hypoinflation, no cardiomegaly and no infiltrates, no effusions.  EKG 79 bpm, normal axis, 1st degree AV block with sinus rhythm with no significant ST segment or T wave changes.   Patient was placed on IV antibiotic therapy and IV fluids.   Assessment and Plan: * Sepsis secondary to UTI ([place]) Severe sepsis with acute metabolic encephalopathy (present on admission).  Patient with hemodynamic stability, with systolic blood pressure 811 to 120 mmHg.   Core temperature has improved to 98, on admission he was hypothermic.  Wbc is 5.7 and cultures are with no growth.   Plan to continue IV antibiotic therapy with ceftriaxone Continue IV fluids with dextrose solutions and balanced electrolyte solutions.  Advance diet with aspiration precautions.  Urology aware of stent being removed, no further intervention at this point.   Acute metabolic encephalopathy His mentation has improved, he continue to be disorientated.  He has mittens bilaterally. Acute delirium on admission.   Plan to continue supportive medical therapy with IV antibiotics for urine infection. Continue IV fluids with dextrose for hypoglycemia.  Resume quetiapine and add as needed haloperidol for agitation, avoid benzodiazepines.  Advance diet, consult Pt and Ot.  Add thiamine and multivitamins.   Type 2 diabetes mellitus with hyperlipidemia (HCC) Hypoglycemia.   Patient with capillary glucose down to 64 this am. Plan to add dextrose to IV fluids and follow hypoglycemia protocol with PRN D50 IV Hold on insulin therapy for now and continue close capillary glucose monitoring.  As outpatient he is on metformin.   History of pulmonary embolism Continue anticoagulation with apixaban. Oxymetry monitoring   Post-polio syndrome Follow with Pt and Ot  Increased mobility.   Renal stones Patient had a recent stent placed on the left, that patient removed.  Urology was contacted on admission and [place] with stent being out. No further intervention at this point.   Prostate cancer ([place]) Follow  up as outpatient   Cognitive impairment Progressive decline in mentation per his family over last 6 months. Continue neuro checks per unit protocol Resume  quetiapine and add as needed haloperidol as needed.         TRH will continue to follow the patient.  Subjective:  No acute overnight events  Physical Exam: General: Appearance:     Alert but Confused       Lungs:      respirations unlabored  Heart:    Normal heart rate. Normal rhythm. No murmurs, rubs, or gallops.   MS:   All extremities are intact.   Neurologic:   Awake, alert, speaking in complete sentences but non-sensical    Vitals:   01/11/22 0502 01/11/22 1306 01/11/22 2000 01/12/22 0407  BP: (!) 152/94 (!) 167/103 (!) 163/98 (!) 143/47  Pulse: 84 75 79 90  Resp: 18   20  Temp: 98 F (36.7 C) (!) 97.4 F (36.3 C) 97.8 F (36.6 C) 98.5 F (36.9 C)  TempSrc: Oral Oral Oral Oral  SpO2: 96% 95% 95% 97%  Weight:      Height:        Data Reviewed:  There are no new results to review at this time.  Family Communication: None  Time spent: 15 minutes.  Author: Cristela Felt, MD 01/12/2022 11:14 AM  For on call review [email or web address].

## 2022-01-12 NOTE — NC FL2 (Signed)
[place] LEVEL OF CARE SCREENING TOOL     IDENTIFICATION  Patient Name: Eddie Sanders Birthdate: 1928-10-31 Sex: male Admission Date (Current Location): 01/08/2022  [facility] and [place] Number:  Herbalist and Address:  [facility],  [address] [place], [place]      Provider Number: [id number]  Attending Physician Name and Address:  Cristela Felt, MD  Relative Name and Phone Number:  Birdie, Beveridge [phone]    Current Level of Care: Hospital Recommended Level of Care: [place] Prior Approval Number:    Date Approved/Denied:   PASRR Number: [id number]  Discharge Plan: SNF    Current Diagnoses: Patient Active Problem List   Diagnosis Date Noted   Palliative care encounter 01/11/2022   Delirium 01/11/2022   Urinary tract infection with hematuria 01/11/2022   Goals of care, counseling/discussion 01/11/2022   Coordination of complex care 01/11/2022   Pressure injury of skin 01/11/2022   Sepsis secondary to UTI ([place]) 01/09/2022   Renal stones 01/09/2022   Prostate cancer ([place]) 01/09/2022   Cognitive impairment 01/09/2022   Sepsis ([place]) 01/09/2022   Malnutrition of moderate degree 01/09/2022   Hypothermia 01/08/2022   History of pulmonary embolism 01/08/2022   HLD (hyperlipidemia) 01/08/2022   Dependence on bilevel positive airway pressure (BiPAP) ventilation due to central sleep apnea 09/18/2021   Acute saddle pulmonary embolism ([place]) 44/81/8563   Acute metabolic encephalopathy 14/97/0263   Type 2 diabetes mellitus with hyperlipidemia ([place]) 78/58/8502   Metabolic encephalopathy    Post-polio syndrome    Post-poliomyelitis syndrome 03/05/2021   Non-seasonal allergic rhinitis 03/05/2021   Complex sleep apnea syndrome 10/28/2020   Treatment-emergent central sleep apnea 10/28/2020   Leg pain, diffuse, right 07/08/2020   Postpoliomyelitis muscular atrophy 07/08/2020   Excessive daytime  sleepiness 07/08/2020   Excessive postexertional fatigue 07/08/2020   PLMD (periodic limb movement disorder) 07/08/2020   Right epiretinal membrane 01/31/2020   Pseudophakia, both eyes 01/31/2020   Posterior capsular opacification visually significant of left eye 01/31/2020   History of vitrectomy 01/31/2020   Posterior vitreous detachment of left eye 01/31/2020   S/P lumbar laminectomy 07/19/2013    Orientation RESPIRATION BLADDER Height & Weight     Self  Normal Incontinent, External catheter Weight: 184 lb 8.4 oz (83.7 kg) Height:  '5\' 11"'$  (180.3 cm)  BEHAVIORAL SYMPTOMS/MOOD NEUROLOGICAL BOWEL NUTRITION STATUS      Incontinent Diet (Regular)  AMBULATORY STATUS COMMUNICATION OF NEEDS Skin   Total Care Verbally Normal                       Personal Care Assistance Level of Assistance  Bathing, Dressing, Feeding Bathing Assistance: Maximum assistance Feeding assistance: Limited assistance Dressing Assistance: Maximum assistance     Functional Limitations Info  Sight, Hearing, Speech Sight Info: Impaired Hearing Info: Adequate Speech Info: Adequate    SPECIAL CARE FACTORS FREQUENCY  PT (By licensed PT), OT (By licensed OT)     PT Frequency: 5x/wk OT Frequency: 5x/wk            Contractures Contractures Info: Not present    Additional Factors Info  Code Status, Allergies Code Status Info: DNR Allergies Info: Gabapentin, Norco (Hydrocodone-acetaminophen), Potassium-containing Compounds, Benzodiazepines, Penicillins           Current Medications (01/12/2022):  This is the current hospital active medication list Current Facility-Administered Medications  Medication Dose Route Frequency Provider Last Rate Last Admin   acetaminophen (TYLENOL)  tablet 650 mg  650 mg Oral Q6H PRN Marylyn Ishihara, Tyrone A, DO       Or   acetaminophen (TYLENOL) suppository 650 mg  650 mg Rectal Q6H PRN Marylyn Ishihara, Tyrone A, DO   650 mg at 01/08/22 1725   apixaban (ELIQUIS) tablet 5 mg  5 mg  Oral BID Vann, Jessica U, DO   5 mg at 01/12/22 1019   cefTRIAXone (ROCEPHIN) 2 g in sodium chloride 0.9 % 100 mL IVPB  2 g Intravenous Q24H Kyle, Tyrone A, DO   Stopped at 01/11/22 2210   Chlorhexidine Gluconate Cloth 2 % PADS 6 each  6 each Topical Daily Kyle, Tyrone A, DO   6 each at 01/11/22 1058   feeding supplement (BOOST / RESOURCE BREEZE) liquid 1 Container  1 Container Oral BID BM Arrien, Jimmy Picket, MD   1 Container at 01/10/22 [phone]   haloperidol (HALDOL) tablet 1 mg  1 mg Oral Q6H PRN Arrien, Jimmy Picket, MD       Or   haloperidol lactate (HALDOL) injection 1 mg  1 mg Intramuscular Q6H PRN Arrien, Jimmy Picket, MD   1 mg at 01/11/22 0117   melatonin tablet 3 mg  3 mg Oral QHS Vann, Jessica U, DO   3 mg at 01/11/22 2111   multivitamin liquid 15 mL  15 mL Oral Daily Arrien, Jimmy Picket, MD   15 mL at 01/12/22 1019   ondansetron (ZOFRAN) tablet 4 mg  4 mg Oral Q6H PRN Cherylann Ratel A, DO       Or   ondansetron (ZOFRAN) injection 4 mg  4 mg Intravenous Q6H PRN Marylyn Ishihara, Tyrone A, DO       pantoprazole (PROTONIX) EC tablet 40 mg  40 mg Oral Daily Arrien, Jimmy Picket, MD   40 mg at 01/12/22 1019   QUEtiapine (SEROQUEL) tablet 25 mg  25 mg Oral QHS Vann, Jessica U, DO   25 mg at 01/11/22 2111   simvastatin (ZOCOR) tablet 20 mg  20 mg Oral Q M,W,F Arrien, Jimmy Picket, MD   20 mg at 01/12/22 1019   thiamine (VITAMIN B1) tablet 100 mg  100 mg Oral Daily Tawni Millers, MD   100 mg at 01/12/22 1018     Discharge Medications: Please see discharge summary for a list of discharge medications.  Relevant Imaging Results:  Relevant Lab Results:   Additional Information SS#: [id number]  Vassie Moselle, LCSW

## 2022-01-12 NOTE — Progress Notes (Signed)
Pt unable to tolerate CPAP due to AMS.

## 2022-01-12 NOTE — Evaluation (Signed)
Occupational Therapy Evaluation Patient Details Name: Eddie Sanders MRN: [id number] DOB: 16-Dec-1928 Today's Date: 01/12/2022   History of Present Illness 88 yo male admitted with sepsis 2* UTI. Recent ureteral stent placement 01/06/22. Hx of dementia, psychosis, prostate ca, post polio syndrome, DM, PE   Clinical Impression   This 88 yo male admitted with above presents to acute OT with PLOF of getting around with a motorized scooter and some A from wife for basic ADLs recently due to declining cognition. He currently is total A for all basic ADLs due to cognition including hallucinations. He may continue to benefit from a trial of OT to see if progress can be made.      Recommendations for follow up therapy are one component of a multi-disciplinary discharge planning process, led by the attending physician.  Recommendations may be updated based on patient status, additional functional criteria and insurance authorization.   Follow Up Recommendations  Skilled nursing-short term rehab (<3 hours/day)     Assistance Recommended at Discharge Frequent or constant Supervision/Assistance  Patient can return home with the following Two people to help with walking and/or transfers;Two people to help with bathing/dressing/bathroom;Assistance with feeding;Direct supervision/assist for medications management;Direct supervision/assist for financial management;Assistance with cooking/housework;Assist for transportation;Help with stairs or ramp for entrance    Functional Status Assessment  Patient has had a recent decline in their functional status and/or demonstrates limited ability to make significant improvements in function in a reasonable and predictable amount of time  Equipment Recommendations  None recommended by OT       Precautions / Restrictions Precautions Precautions: Fall Precaution Comments: agitated/combative; currently in mittens Restrictions Weight Bearing Restrictions: No       Mobility Bed Mobility Overal bed mobility: Needs Assistance             General bed mobility comments: Total +2 to reposition pt in bed. Pt refused mobility. Did not strongly encourage for safety reasons-easily agitated and can be combative; pt would not follow commands for any bed mobiltiy and when told we were going to work with him on sitting up on edge of bed he said "no, you are not"            ADL either performed or assessed with clinical judgement   ADL                                         General ADL Comments: total A; attempted to get pt to wash face but he would not attempt to grab washcloth and once placed in his hand he did not attempt to bring to face     Vision   Additional Comments: Kept eyes closed much of session            Pertinent Vitals/Pain Pain Assessment Pain Assessment: Faces Faces Pain Scale: No hurt     Hand Dominance Right   Extremity/Trunk Assessment Upper Extremity Assessment Upper Extremity Assessment:  (moves arms in functional manner spontaneously but not on command)   Lower Extremity Assessment Lower Extremity Assessment: Generalized weakness;Difficult to assess due to impaired cognition       Communication Communication Communication:  (difficult to understand speech at times; also currently hallucinating)   Cognition Arousal/Alertness:  (pt verbally responds to verbal and tactile stimuli; tends to keep eyes closed) Behavior During Therapy: Restless, Agitated Overall Cognitive Status: Impaired/Different from baseline  General Comments: hallucinating. easily agitated; does have impaired cognition pta--but worse now; not oriented to place,did respond to name                Home Living Family/patient expects to be discharged to::  (independent living with wife) Living Arrangements: Spouse/significant other                           Home  Equipment: Electric scooter;Grab bars - tub/shower          Prior Functioning/Environment Prior Level of Function : Needs assist;Patient poor historian/Family not available             Mobility Comments: Does not ambulate--only uses scooter to get around in last 2 months (info taken from previous admission) ADLs Comments: wife has to help with pulling pants up and down (info taken from previous admission)        OT Problem List: Impaired balance (sitting and/or standing);Decreased cognition;Decreased safety awareness      OT Treatment/Interventions: Self-care/ADL training;Balance training;DME and/or AE instruction;Patient/family education    OT Goals(Current goals can be found in the care plan section) Acute Rehab OT Goals OT Goal Formulation: Patient unable to participate in goal setting Time For Goal Achievement: 01/26/22 Potential to Achieve Goals: Fair  OT Frequency: Min 2X/week    Co-evaluation PT/OT/SLP Co-Evaluation/Treatment: Yes Reason for Co-Treatment: Necessary to address cognition/behavior during functional activity PT goals addressed during session: Strengthening/ROM;Mobility/safety with mobility OT goals addressed during session: Strengthening/ROM;ADL's and self-care      AM-PAC OT "6 Clicks" Daily Activity     Outcome Measure Help from another person eating meals?: Total Help from another person taking care of personal grooming?: Total Help from another person toileting, which includes using toliet, bedpan, or urinal?: Total Help from another person bathing (including washing, rinsing, drying)?: Total Help from another person to put on and taking off regular upper body clothing?: Total Help from another person to put on and taking off regular lower body clothing?: Total 6 Click Score: 6   End of Session    Activity Tolerance: Treatment limited secondary to agitation Patient left: in bed;with call bell/phone within reach;with bed alarm set;with  restraints reapplied (bil mitts)  OT Visit Diagnosis: Other abnormalities of gait and mobility (R26.89);Cognitive communication deficit (R41.841);Other symptoms and signs involving cognitive function                Time: 6440-3474 OT Time Calculation (min): 15 min Charges:  OT General Charges $OT Visit: 1 Visit OT Evaluation $OT Eval Low Complexity: [place], OTR/L Acute Rehab Services [facility] [phone] Office [phone]    Almon Register 01/12/2022, 12:30 PM

## 2022-01-12 NOTE — TOC Initial Note (Addendum)
Transition of Care [facility]) - Initial/Assessment Note    Patient Details  Name: Eddie Sanders MRN: [id number] Date of Birth: 10/12/1928  [facility]) CM/SW Contact:    Vassie Moselle, LCSW Phone Number: 01/12/2022, 2:40 PM  Clinical Narrative:                 Spoke with pt's daughter over the phone to discuss discharge plans. Current plan is for pt to return to [facility] for SNF with Palliative care/hospice. Per pt's daughter she has already spoken to [facility] and they have a SNF bed available for this pt. Pt's daughter agreeable to referral to [place] for palliative care/hospice services.  CSW left voicemail for [place] at [facility] to confirm pt transferring to their SNF.  CSW sent referral to [place] for palliative care/hospice services.   Update 2:45pm- Received return call form [place] at [facility] who confirms pt is able to transfer to their SNF at discharge. She shares he will be going to room 107. RN to call report to [phone]. Signed FL-2 and DC summary will need to be sent prior to discharge.   Expected Discharge Plan: [place] (w/ Palliative care) Barriers to Discharge: No Barriers Identified   Patient Goals and CMS Choice Patient states their goals for this hospitalization and ongoing recovery are:: For pt to return to [facility] CMS Medicare.gov Compare Post Acute Care list provided to:: Patient Represenative (must comment) (Daughter) Choice offered to / list presented to : Spouse, Adult Children  Expected Discharge Plan and Services Expected Discharge Plan: [place] (w/ Palliative care) In-house Referral: Hospice / Palliative Care Discharge Planning Services: CM Consult Post Acute Care Choice: [place] arrangements for the past 2 months: [place]                 DME Arranged: N/A DME Agency: NA                  Prior Living  Arrangements/Services Living arrangements for the past 2 months: [place] Lives with:: Spouse Patient language and need for interpreter reviewed:: Yes Do you feel safe going back to the place where you live?: Yes      Need for Family Participation in Patient Care: Yes (Comment) Care giver support system in place?: No (comment) Current home services: DME Criminal Activity/Legal Involvement Pertinent to Current Situation/Hospitalization: No - Comment as needed  Activities of Daily Living Home Assistive Devices/Equipment: Eyeglasses ADL Screening (condition at time of admission) Patient's cognitive ability adequate to safely complete daily activities?: Yes Is the patient deaf or have difficulty hearing?: No Does the patient have difficulty seeing, even when wearing glasses/contacts?: Yes Does the patient have difficulty concentrating, remembering, or making decisions?: Yes Patient able to express need for assistance with ADLs?: Yes Does the patient have difficulty dressing or bathing?: Yes Independently performs ADLs?: No Does the patient have difficulty walking or climbing stairs?: Yes Weakness of Legs: Both Weakness of Arms/Hands: None  Permission Sought/Granted Permission sought to share information with : Facility Sport and exercise psychologist, Family Supports Permission granted to share information with : No              Emotional Assessment Appearance::  (Spoke with daughter over the phone) Attitude/Demeanor/Rapport: Unable to Assess Affect (typically observed): Unable to Assess Orientation: : Oriented to Self Alcohol / Substance Use: Not Applicable Psych Involvement: No (comment)  Admission diagnosis:  Delirium [R41.0] Hypothermia [T68.XXXA] Hypothermia, initial encounter [T68.XXXA] Urinary tract  infection with hematuria, site unspecified [N39.0, R31.9] Sepsis ([place]) [A41.9] Patient Active Problem List   Diagnosis Date Noted   Palliative care encounter  01/11/2022   Delirium 01/11/2022   Urinary tract infection with hematuria 01/11/2022   Goals of care, counseling/discussion 01/11/2022   Coordination of complex care 01/11/2022   Pressure injury of skin 01/11/2022   Sepsis secondary to UTI ([place]) 01/09/2022   Renal stones 01/09/2022   Prostate cancer ([place]) 01/09/2022   Cognitive impairment 01/09/2022   Sepsis ([place]) 01/09/2022   Malnutrition of moderate degree 01/09/2022   Hypothermia 01/08/2022   History of pulmonary embolism 01/08/2022   HLD (hyperlipidemia) 01/08/2022   Dependence on bilevel positive airway pressure (BiPAP) ventilation due to central sleep apnea 09/18/2021   Acute saddle pulmonary embolism ([place]) 88/50/2774   Acute metabolic encephalopathy 12/87/8676   Type 2 diabetes mellitus with hyperlipidemia ([place]) 72/10/4707   Metabolic encephalopathy    Post-polio syndrome    Post-poliomyelitis syndrome 03/05/2021   Non-seasonal allergic rhinitis 03/05/2021   Complex sleep apnea syndrome 10/28/2020   Treatment-emergent central sleep apnea 10/28/2020   Leg pain, diffuse, right 07/08/2020   Postpoliomyelitis muscular atrophy 07/08/2020   Excessive daytime sleepiness 07/08/2020   Excessive postexertional fatigue 07/08/2020   PLMD (periodic limb movement disorder) 07/08/2020   Right epiretinal membrane 01/31/2020   Pseudophakia, both eyes 01/31/2020   Posterior capsular opacification visually significant of left eye 01/31/2020   History of vitrectomy 01/31/2020   Posterior vitreous detachment of left eye 01/31/2020   S/P lumbar laminectomy 07/19/2013   PCP:  Crist Infante, MD Pharmacy:   [facility] [phone] [place], [place] - [place] [address] Phone: [phone] Fax: [phone]     Social Determinants of Health ([place]) Interventions    Readmission Risk Interventions    01/12/2022   12:52 PM  Readmission Risk Prevention Plan  Transportation  Screening Complete  PCP or Specialist Appt within 5-7 Days Complete  Home Care Screening Complete  Medication Review (RN CM) Complete

## 2022-01-12 NOTE — Plan of Care (Signed)

## 2022-01-13 LAB — CULTURE, BLOOD (ROUTINE X 2)
Culture: NO GROWTH
Culture: NO GROWTH
Special Requests: ADEQUATE
Special Requests: ADEQUATE

## 2022-01-13 LAB — GLUCOSE, CAPILLARY
Glucose-Capillary: 81 mg/dL (ref 70–99)
Glucose-Capillary: 90 mg/dL (ref 70–99)

## 2022-01-13 MED ORDER — SODIUM CHLORIDE 0.9 % IV SOLN: INTRAVENOUS | Status: DC | PRN

## 2022-01-13 NOTE — Progress Notes (Signed)
[facility]) Hospital Liaison Note   Received request from [facility], for family interest in hospice services at discharge instead of Palliative services.    Spoke with Daughter, Eddie Sanders to initiate education related to hospice philosophy, services, and team approach to care. Eddie Sanders verbalized understanding of information given. Per discussion, the plan is for patient to discharge to [facility] SNF today.    No DME needs.    Please send signed and completed DNR home with patient/family. Please provide prescriptions at discharge as needed to ensure ongoing symptom management.    [facility] information and contact numbers given to family & above information shared with TOC.   Please call with any questions/concerns.    Thank you for the opportunity to participate in this patient's care.   Zigmund Gottron  [facility] Liaison  [phone]

## 2022-01-13 NOTE — TOC Transition Note (Addendum)
Transition of Care [facility]) - CM/SW Discharge Note   Patient Details  Name: Eddie Sanders MRN: [id number] Date of Birth: Dec 23, 1928  Transition of Care [facility]) CM/SW Contact:  Vassie Moselle, LCSW Phone Number: 01/13/2022, 1:37 PM   Clinical Narrative:    Pt is to transfer to [facility] for SNF. Pt will be going to room 107. RN to call report to [phone]. Pt will be receiving hospice services through [facility]. Spoke with pt's daughter who is agreeable to this plan. PTAR to be arranged for transportation.    Final next level of care: [place] (w/ Palliative care) Barriers to Discharge: No Barriers Identified   Patient Goals and CMS Choice Patient states their goals for this hospitalization and ongoing recovery are:: For pt to return to [facility] CMS Medicare.gov Compare Post Acute Care list provided to:: Patient Represenative (must comment) (Daughter) Choice offered to / list presented to : Spouse, Adult Children  Discharge Placement PASRR number recieved: 01/12/22            Patient chooses bed at: [facility] Patient to be transferred to facility by: [place] Name of family member notified: Charolett Bumpers Patient and family notified of of transfer: 01/13/22  Discharge Plan and Services In-house Referral: Hospice / Palliative Care Discharge Planning Services: CM Consult Post Acute Care Choice: [place], Hospice          DME Arranged: N/A DME Agency: NA                  Social Determinants of Health ([place]) Interventions     Readmission Risk Interventions    01/13/2022    1:37 PM 01/12/2022   12:52 PM  Readmission Risk Prevention Plan  Transportation Screening Complete Complete  PCP or Specialist Appt within 5-7 Days Complete Complete  Home Care Screening Complete Complete  Medication Review (RN CM) Complete Complete

## 2022-01-13 NOTE — Progress Notes (Signed)
[facility]) Hospital liaison note  Notified by [facility] manager of patient/family request for [facility] Palliative services at [facility] after discharge.  [facility] hospital liaison will follow patient for discharge disposition.  Please call with any hospice or outpatient palliative care related questions.   Thank you for the opportunity to participate in this patient's care.  [place]  [facility] liaison  [phone]

## 2022-01-13 NOTE — Discharge Summary (Addendum)
Physician Discharge Summary   Patient: Eddie Sanders MRN: [id number] DOB: [birth year]  Admit date:     01/08/2022  Discharge date: 01/13/22  Discharge Physician: Manfred Shirts Jacen Carlini   PCP: Crist Infante, MD   Discharge Diagnoses: Principal Problem:   Sepsis secondary to UTI [facility]) Active Problems:   Acute metabolic encephalopathy   Type 2 diabetes mellitus with hyperlipidemia ([place])   History of pulmonary embolism   Post-polio syndrome   Renal stones   Prostate cancer ([place])   Cognitive impairment   Sepsis ([place])   Malnutrition of moderate degree   Palliative care encounter   Delirium   Urinary tract infection with hematuria   Goals of care, counseling/discussion   Coordination of complex care   Pressure injury of skin   Need for emotional support  Resolved Problems:   * No resolved hospital problems. [facility] Course: Mr. Cowger was admitted to the hospital with the working diagnosis of sepsis due to urinary tract infection.   88 yo male with the past medical history of post polio syndrome, prostate cancer, T2DM, pulmonary embolism, and dyslipidemia who presented with altered mental status. 01/06/22 had 19 mm UVJ stone with obstruction, with hydronephrosis. Underwent cystoscopy with left retrograde pyelogram, had stone extraction and insertion of left double J stent. He was discharged to SNF.  The night prior to the current hospitalization he had acute change in his mentation, becoming agitated and having hallucinations. He pulled his ureteral stent out. EMS was called and he was transported to the ED. On his initial physical examination his blood pressure was 182/117, HR 86, RR 17 to 24, tep 94.5 and 02 saturation 97%. Patient had difficulty following commands, lungs with no rales or rhonchi, no wheezing, heart with S1 and S2 present and rhythmic, abdomen with no distention and non tender, no lower extremity edema, no rashes.   Na 138, K 3,8 Cl 103 bicarbonate 27, glucose 205 bun  20 cr 0,97  Wbc 5,9 hgb 13,6 plt 184  Urine analysis SG 1,011, 100 protein, 150 glucose, >50 rbc, 21-50 wbc.   Head Ct with no acute changes.   Chest radiograph with hypoinflation, no cardiomegaly and no infiltrates, no effusions.  EKG 79 bpm, normal axis, 1st degree AV block with sinus rhythm with no significant ST segment or T wave changes.   Patient was placed on IV antibiotic therapy and IV fluids.   Urine cultures grew Enterococcus faecalis he completed course of IV antibiotics.  And has ongoing cognitive impairment which has progressed over the past 6 months per family.  He is on Seroquel and as needed Haldol.  Family has opted to send patient to a skilled nursing facility where he will possibly get palliative/hospice services.  See Below for details of hospitalization..    Assessment and Plan: * Sepsis secondary to UTI ([place]) Severe sepsis with acute metabolic encephalopathy (present on admission).  Patient with hemodynamic stability, with systolic blood pressure 751 to 120 mmHg.  Core temperature has improved to 98, on admission he was hypothermic.  Wbc is 5.7 and cultures are with no growth.  Completed IV abx Urology aware of stent being removed, no further intervention at this point.   Acute metabolic encephalopathy His mentation has improved, he continue to be disorientated.  He has mittens bilaterally. Acute delirium on admission.   Plan to continue supportive medical therapy with IV antibiotics for urine infection. Continue IV fluids with dextrose for hypoglycemia.  Resume quetiapine and add as needed haloperidol  for agitation, avoid benzodiazepines.  Advance diet, consult Pt and Ot.  Add thiamine and multivitamins.   Type 2 diabetes mellitus with hyperlipidemia ([place]) Managed on SSI, could resume Home medications  History of pulmonary embolism Continue anticoagulation with apixaban. Oxymetry monitoring   Post-polio syndrome Follow with Pt and Ot   Increased mobility.   Renal stones Patient had a recent stent placed on the left, that patient removed.  Urology was contacted on admission and [place] with stent being out. No further intervention at this point.   Prostate cancer ([place]) Follow  up as outpatient   Cognitive impairment Progressive decline in mentation per his family over last 6 months. Continue neuro checks per unit protocol Resume quetiapine and add as needed haloperidol as needed.   Suspect pyonephrosis due to ureteral stent         Pain control - [facility] Controlled Substance Reporting System database was reviewed. and patient was instructed, not to drive, operate heavy machinery, perform activities at heights, swimming or participation in water activities or provide baby-sitting services while on Pain, Sleep and Anxiety Medications; until their outpatient Physician has advised to do so again. Also recommended to not to take more than prescribed Pain, Sleep and Anxiety Medications.  Consultants: Palliative care Procedures performed:   Disposition: Skilled nursing facility Diet recommendation:  Cardiac diet DISCHARGE MEDICATION: Allergies as of 01/13/2022       Reactions   Gabapentin Other (See Comments)   Hallucinations   Norco [hydrocodone-acetaminophen] Other (See Comments)   Family feels this causes "cognitive impairment"   Potassium-containing Compounds Hives, Diarrhea, Nausea And Vomiting   Benzodiazepines Itching, Swelling, Rash   Penicillins Hives, Swelling, Rash        Medication List     TAKE these medications    acetaminophen 500 MG tablet Commonly known as: TYLENOL Take 1,000 mg by mouth at bedtime.   Eliquis 5 MG Tabs tablet Generic drug: apixaban Take 5 mg by mouth 2 (two) times daily.   HYDROcodone-acetaminophen 5-325 MG tablet Commonly known as: NORCO/VICODIN Take 1 tablet by mouth every 6 (six) hours as needed for moderate pain.   metFORMIN 500 MG 24 hr tablet Commonly  known as: GLUCOPHAGE-XR Take 500 mg by mouth in the morning and at bedtime.   Prolia 60 MG/ML Sosy injection Generic drug: denosumab Inject 60 mg into the skin every 6 (six) months.   QUEtiapine 12.5 mg Tabs tablet Commonly known as: SEROQUEL Take 12.5 mg by mouth at bedtime.   simvastatin 20 MG tablet Commonly known as: ZOCOR Take 20 mg by mouth every Monday, Wednesday, and Friday.   Valtrex 1000 MG tablet Generic drug: valACYclovir Take 1,000 mg by mouth daily as needed (as directed for the onset of a fever blister).        Discharge Exam: Filed Weights   01/08/22 0549 01/08/22 1300 01/10/22 1834  Weight: 79.5 kg 83.6 kg 83.7 kg  Neurology awake and alert, bilateral mittens in place, orientated only to person, follows simple commands, no agitation  ENT with mild pallor, dry mucous membranes  Cardiovascular with S1 and S2 present and rhythmic with no gallops, rubs or murmurs Respiratory with no rales, rhonchi or wheezing on anterior auscultation  Abdomen with no distention  No lower extremity edema  Data Reviewed:  Condition at discharge: good  The results of significant diagnostics from this hospitalization (including imaging, microbiology, ancillary and laboratory) are listed below for reference.   Imaging Studies: CT Renal Stone Study  Result Date:  01/08/2022 CLINICAL DATA:  Abdominal/flank pain, stone suspected. EXAM: CT ABDOMEN AND PELVIS WITHOUT CONTRAST TECHNIQUE: Multidetector CT imaging of the abdomen and pelvis was performed following the standard protocol without IV contrast. RADIATION DOSE REDUCTION: This exam was performed according to the departmental dose-optimization program which includes automated exposure control, adjustment of the mA and/or kV according to patient size and/or use of iterative reconstruction technique. COMPARISON:  CT 12/22/2021 and intraoperative fluoroscopy 01/06/2022 FINDINGS: Lower chest: Again noted are small cysts or blebs along  the medial right lower lobe. Patchy densities at lung bases are suggestive for atelectasis. No large pleural effusions. Hepatobiliary: Normal appearance of the liver and gallbladder. Pancreas: Unremarkable. No pancreatic ductal dilatation or surrounding inflammatory changes. Spleen: Normal in size without focal abnormality. Adrenals/Urinary Tract: Normal adrenal glands. Stones in the right kidney lower pole, largest measuring 8 mm. Negative for right hydronephrosis. Negative for right ureter stones. Left ureter stent has been placed but the ureter stent has retracted. Proximal aspect of the stent is in the distal left ureter and this stent is extending out through the urethra. Stent appears to be extending out of the penis based on the topogram image. Left hydronephrosis has markedly decompressed. Evidence for parapelvic cysts or residual dilated calices in left kidney upper pole. Question tiny stones in left kidney lower pole. Distal left ureter stones have been removed. Urinary bladder is decompressed with gas within the bladder. Gas is likely related to recent instrumentation. Stomach/Bowel: Again noted is colon anterior to the liver. Normal appendix without inflammatory changes. No evidence for bowel obstruction and no focal bowel inflammation. Normal appearance of the stomach. Again noted is gas-filled structure near the distal esophagus that measures up to 4.2 cm and likely represents a large distal esophageal diverticulum. Vascular/Lymphatic: Again noted is a prominent mesenteric lymph node measuring up to 1.6 cm on image 38/2. Overall, there is not significant lymph node enlargement in the abdomen or pelvis. Atherosclerotic calcifications in the abdominal aorta without aneurysm. Reproductive: Prostatectomy. Surgical clips in the pelvis and inguinal regions bilaterally. Other: Negative for free fluid.  Negative for free air. Musculoskeletal: Degenerative changes in both hips appear chronic. Multilevel disc  disease in the lumbar spine with retrolisthesis at L3-L4. Facet arthropathy in the lumbar spine. No acute bone abnormality. Calcifications or postsurgical changes along the anterior abdominal wall. IMPRESSION: 1. Interval removal of the distal left ureter stones and placement of a left ureter stent. The left ureter stent is partially dislodged with only a small portion remaining in the distal left ureter. 2. Decreased left hydroureteronephrosis. Residual dilated calices in left upper pole and mild dilatation of the left ureter. 3. Nonobstructive right renal calculi. Probable tiny left renal calculi. 4. Prominent distal esophageal diverticulum without acute inflammatory changes. 5. Prostatectomy. Electronically Signed   By: Markus Daft M.D.   On: 01/08/2022 09:19   CT Head Wo Contrast  Result Date: 01/08/2022 CLINICAL DATA:  Altered mental status. Patient screaming and yelling at nursing home. EXAM: CT HEAD WITHOUT CONTRAST TECHNIQUE: Contiguous axial images were obtained from the base of the skull through the vertex without intravenous contrast. RADIATION DOSE REDUCTION: This exam was performed according to the departmental dose-optimization program which includes automated exposure control, adjustment of the mA and/or kV according to patient size and/or use of iterative reconstruction technique. COMPARISON:  CT head without contrast 06/23/2021 FINDINGS: Brain: Mild atrophy and moderate white matter changes are stable. No acute infarct, hemorrhage, or mass lesion is present. Deep brain nuclei are within  normal limits. The ventricles are of normal size. No significant extraaxial fluid collection is present. The brainstem and cerebellum are within normal limits. Vascular: No hyperdense vessel or unexpected calcification. Skull: Calvarium is intact. No focal lytic or blastic lesions are present. No significant extracranial soft tissue lesion is present. Sinuses/Orbits: The paranasal sinuses and mastoid air cells  are clear. Bilateral lens replacements are noted. Globes and orbits are otherwise unremarkable. IMPRESSION: 1. No acute intracranial abnormality or significant interval change. 2. Stable atrophy and moderate white matter disease. This likely reflects the sequela of chronic microvascular ischemia. Electronically Signed   By: San Morelle M.D.   On: 01/08/2022 08:05   DG Chest Port 1 View  Result Date: 01/08/2022 CLINICAL DATA:  Altered mental status. EXAM: PORTABLE CHEST 1 VIEW COMPARISON:  One-view chest x-ray 08/16/2021 FINDINGS: Heart size is exaggerated by low lung volumes. Atherosclerotic calcifications are present at the aortic arch. Lungs are clear. No edema or effusion is present. Degenerative changes are again noted in the shoulders. IMPRESSION: 1. Low lung volumes. 2. No acute cardiopulmonary disease. Electronically Signed   By: San Morelle M.D.   On: 01/08/2022 08:00   DG C-Arm 1-60 Min-No Report  Result Date: 01/06/2022 Fluoroscopy was utilized by the requesting physician.  No radiographic interpretation.   DG C-Arm 1-60 Min-No Report  Result Date: 01/06/2022 Fluoroscopy was utilized by the requesting physician.  No radiographic interpretation.    Microbiology: Results for orders placed or performed during the hospital encounter of 01/08/22  Urine Culture     Status: Abnormal   Collection Time: 01/08/22  7:24 AM   Specimen: In/Out Cath Urine  Result Value Ref Range Status   Specimen Description   Final    IN/OUT CATH URINE Performed at [facility] [address], [place], [place] [address]    Special Requests   Final    NONE Performed at [facility], [place] [address], [place], [place] [address]    Culture 7,000 COLONIES/mL ENTEROCOCCUS FAECALIS (A)  Final   Report Status 01/10/2022 FINAL  Final   Organism ID, Bacteria ENTEROCOCCUS FAECALIS (A)  Final      Susceptibility   Enterococcus faecalis - MIC*    AMPICILLIN  <=2 SENSITIVE Sensitive     NITROFURANTOIN <=16 SENSITIVE Sensitive     VANCOMYCIN 2 SENSITIVE Sensitive     * 7,000 COLONIES/mL ENTEROCOCCUS FAECALIS  Culture, blood (routine x 2)     Status: None   Collection Time: 01/08/22  7:30 AM   Specimen: BLOOD  Result Value Ref Range Status   Specimen Description   Final    BLOOD BLOOD LEFT FOREARM Performed at [facility] [address], [place], [place] [address]    Special Requests   Final    BOTTLES DRAWN AEROBIC AND ANAEROBIC Blood Culture adequate volume Performed at [facility] [address] [place], [place] [address]    Culture   Final    NO GROWTH 5 DAYS Performed at [facility], [place] [address], [place], [place] [address]    Report Status 01/13/2022 FINAL  Final  Resp Panel by RT-PCR (Flu A&B, Covid) Anterior Nasal Swab     Status: None   Collection Time: 01/08/22  8:06 AM   Specimen: Anterior Nasal Swab  Result Value Ref Range Status   SARS Coronavirus 2 by RT PCR NEGATIVE NEGATIVE Final    Comment: (NOTE) SARS-CoV-2 target nucleic acids are NOT DETECTED.  The SARS-CoV-2 RNA is generally detectable in upper respiratory specimens during the acute phase of infection. The lowest concentration of SARS-CoV-2 viral copies this assay can detect is 138 copies/mL. A negative result does not preclude SARS-Cov-2 infection and should not be used as the sole basis for treatment or other patient management decisions. A negative result may occur with  improper specimen collection/handling, submission of specimen other than nasopharyngeal swab, presence of viral mutation(s) within the areas targeted by this assay, and inadequate number of viral copies(<138 copies/mL). A negative result must be combined with clinical observations, patient history, and epidemiological information. The expected result is Negative.  Fact Sheet for Patients:  EntrepreneurPulse.com.au  Fact  Sheet for Healthcare Providers:  IncredibleEmployment.be  This test is no t yet approved or cleared by the [place] FDA and  has been authorized for detection and/or diagnosis of SARS-CoV-2 by FDA under an Emergency Use Authorization (EUA). This EUA will remain  in effect (meaning this test can be used) for the duration of the COVID-19 declaration under Section 564(b)(1) of the Act, 21 U.S.C.section 360bbb-3(b)(1), unless the authorization is terminated  or revoked sooner.       Influenza A by PCR NEGATIVE NEGATIVE Final   Influenza B by PCR NEGATIVE NEGATIVE Final    Comment: (NOTE) The Xpert Xpress SARS-CoV-2/FLU/RSV plus assay is intended as an aid in the diagnosis of influenza from Nasopharyngeal swab specimens and should not be used as a sole basis for treatment. Nasal washings and aspirates are unacceptable for Xpert Xpress SARS-CoV-2/FLU/RSV testing.  Fact Sheet for Patients: EntrepreneurPulse.com.au  Fact Sheet for Healthcare Providers: IncredibleEmployment.be  This test is not yet approved or cleared by the [place] FDA and has been authorized for detection and/or diagnosis of SARS-CoV-2 by FDA under an Emergency Use Authorization (EUA). This EUA will remain in effect (meaning this test can be used) for the duration of the COVID-19 declaration under Section 564(b)(1) of the Act, 21 U.S.C. section 360bbb-3(b)(1), unless the authorization is terminated or revoked.  Performed at [facility], [place] [address], [place], [place] [address]   Culture, blood (routine x 2)     Status: None   Collection Time: 01/08/22  1:10 PM   Specimen: BLOOD  Result Value Ref Range Status   Specimen Description   Final    BLOOD SITE NOT SPECIFIED Performed at [facility] [address], [place], [place] [address]    Special Requests   Final    BOTTLES DRAWN AEROBIC AND ANAEROBIC Blood  Culture adequate volume Performed at [facility] [address] [place], [place] [address]    Culture   Final    NO GROWTH 5 DAYS Performed at [facility], [place] [address] [place], [place] [address]    Report Status 01/13/2022 FINAL  Final  MRSA Next Gen by PCR, Nasal     Status: None   Collection Time: 01/08/22  1:52 PM   Specimen: Nasal Mucosa; Nasal Swab  Result Value Ref Range Status   MRSA by PCR Next Gen NOT DETECTED NOT DETECTED Final    Comment: (NOTE) The GeneXpert MRSA Assay (FDA approved for NASAL specimens only), is one component of a comprehensive MRSA colonization surveillance program. It is not intended to diagnose MRSA infection nor to guide or monitor treatment for MRSA infections. Test performance is not FDA approved in patients less than 102 years old. Performed at [facility], [place] [address] [place],  [place] [address]     Labs: CBC: Recent Labs  Lab 01/08/22 0700 01/09/22 0235 01/10/22 0223 01/11/22 0558  WBC 5.9 5.7 5.3 7.7  NEUTROABS 4.4  --   --   --   HGB 13.6 10.9* 10.7* 12.9*  HCT 40.8 34.4* 32.2* 38.5*  MCV 90.1 95.0 93.9 90.2  PLT 184 163 145* 563   Basic Metabolic Panel: Recent Labs  Lab 01/08/22 0700 01/09/22 0235 01/10/22 0223 01/10/22 2158 01/11/22 0558  NA 138 143 142  --  141  K 3.8 3.9 3.4*  --  3.5  CL 103 111 111  --  105  CO2 '27 23 23  '$ --  22  GLUCOSE 205* 84 148*  --  139*  BUN '20 20 20  '$ --  17  CREATININE 0.97 1.12 0.86  --  0.86  CALCIUM 9.6 8.2* 8.2*  --  9.0  MG  --   --   --  1.6*  --    Liver Function Tests: Recent Labs  Lab 01/08/22 0700 01/09/22 0235  AST 23 19  ALT 30 24  ALKPHOS 65 46  BILITOT 0.7 0.5  PROT 7.4 5.6*  ALBUMIN 4.0 3.0*   CBG: Recent Labs  Lab 01/12/22 1154 01/12/22 1724 01/12/22 2128 01/13/22 0737 01/13/22 1128  GLUCAP 100* 84 72 81 90    Discharge time spent: greater than 30 minutes.  Signed: Cristela Felt, MD [facility] 01/13/2022

## 2022-01-14 ENCOUNTER — Encounter: Payer: Self-pay | Admitting: Orthopedic Surgery

## 2022-01-14 ENCOUNTER — Non-Acute Institutional Stay (SKILLED_NURSING_FACILITY): Payer: Medicare PPO | Admitting: Orthopedic Surgery

## 2022-01-14 DIAGNOSIS — Z86711 Personal history of pulmonary embolism: Secondary | ICD-10-CM

## 2022-01-14 DIAGNOSIS — G14 Postpolio syndrome: Secondary | ICD-10-CM | POA: Diagnosis not present

## 2022-01-14 DIAGNOSIS — A419 Sepsis, unspecified organism: Secondary | ICD-10-CM

## 2022-01-14 DIAGNOSIS — G9340 Encephalopathy, unspecified: Secondary | ICD-10-CM

## 2022-01-14 DIAGNOSIS — R4189 Other symptoms and signs involving cognitive functions and awareness: Secondary | ICD-10-CM

## 2022-01-14 DIAGNOSIS — E1165 Type 2 diabetes mellitus with hyperglycemia: Secondary | ICD-10-CM | POA: Diagnosis not present

## 2022-01-14 DIAGNOSIS — N368 Other specified disorders of urethra: Secondary | ICD-10-CM | POA: Diagnosis not present

## 2022-01-14 DIAGNOSIS — N390 Urinary tract infection, site not specified: Secondary | ICD-10-CM

## 2022-01-14 DIAGNOSIS — E785 Hyperlipidemia, unspecified: Secondary | ICD-10-CM | POA: Diagnosis not present

## 2022-01-14 NOTE — Progress Notes (Signed)
Location:  [facility] Room Number: 107A Place of Service:  SNF [phone]) Provider: Windell Moulding, NP   Patient Care Team: Crist Infante, MD as PCP - General (Internal Medicine)  Extended Emergency Contact Information Primary Emergency Contact: Denzil Hughes Address: [address]          [place],  [place] Phone: [phone] Mobile Phone: [phone] Relation: Spouse Secondary Emergency Contact: [place] Mobile Phone: [phone] Relation: Daughter  Code Status:  DNR Goals of care: Advanced Directive information    01/14/2022   10:13 AM  Advanced Directives  Does Patient Have a Medical Advance Directive? Yes  Type of Advance Directive Living will;Out of facility DNR (pink MOST or yellow form)  Does patient want to make changes to medical advance directive? No - Patient declined     Chief Complaint  Patient presents with   Hospitalization Follow-up    Nursing Home Visit with provider on site at the facility. Patient is being seen today for several condition from the hospital.    Quality Metric Gaps    To Discuss the following as listed to the Care Gaps as listed below.     HPI:  Pt is a 88 y.o. male seen today for a f/u s/p hospitalization 11/23-11/28.   He currently resides on the skilled nursing unit at [facility] PMH: PE, OSA, HLD,T2DM, post polio syndrome, prostate cancer s/p prostatectomy 1995, PSA recurrence with adjuvant radiation 2005, renal stones 2016, and cognitive impairment.    H/o 19 mm stone to left distal ureter with hydronephrosis since 08/2021. 01/06/2022 he underwent cystoscopy with left retrograde pyelogram and ureteroscopy with stent placement. He tolerated procedure well and was discharged to [facility] rehabilitation unit. While in rehab he was noted to have increased agitation. Ativan 0.5 mg po BID prn ordered for behaviors. UOP sluggish at first, but he was able to void on his own. He had 2  strings at the opening of his penis associated with his urethral stent. Orders from urology to pull stings 11/27. 11/23 he was hallucinating and confused. He also pulled strings out. He was sent to [facility] ED for evaluation. Hypothermic with elevated blood pressure on admission. He was diagnosed with sepsis secondary to UTI and acute metabolic encephalopathy. Na 138, K 3,8 Cl 103, bicarbonate 27, glucose 205, bun/creat 20/.097, Wbc 5,9 hgb 13,6 plt 184. Urine analysis SG 1,011, 100 protein,150 glucose, >50 rbc, 21-50 wbc. CXR unremarkable. EKG 1st degree AV block with sinus rhythm. CT head did not indicate acute changes, moderate white matter disease present. Urine cultures grew Enterococcus faecalis, given IV ceftriaxone. Blood cultures negative for growth. Urology consulted due to stent being removed premature, no further intervention recommended. Delirium was treated with Seroquel and haldol prn. Advised to avoid benzodiazepines in future. Palliative services were consulted due to progressive decline in cognition within last 6 months. He was discharged back to [facility] skilled nursing unit.   Today, he continues to have hallucinations, agitation has subsided. He has trouble answering simple questions at times. Family has provided sitter services. He is eating and drinking well. UOP stable. Family has decided on comfort care, hospice consult scheduled later on this afternoon.   Past Surgical History:  Procedure Laterality Date   cataract surgery     bilateral   CYSTOSCOPY WITH RETROGRADE PYELOGRAM, URETEROSCOPY AND STENT PLACEMENT Left 01/06/2022   Procedure: CYSTOSCOPY WITH LEFT RETROGRADE PYELOGRAM, URETEROSCOPY WITH  HOLMIUM LASER  AND STENT PLACEMENT;  Surgeon: Irine Seal, MD;  Location: [facility] ORS;  Service: Urology;  Laterality: Left;  1 HR FOR CASE   LUMBAR EPIDURAL INJECTION     LUMBAR LAMINECTOMY/DECOMPRESSION MICRODISCECTOMY Right 07/19/2013   Procedure: LUMBAR LAMINECTOMY/DECOMPRESSION  MICRODISCECTOMY 2 LEVELS     lumbar  three/four,  four/five;  Surgeon: Eustace Moore, MD;  Location: [facility];  Service: Neurosurgery;  Laterality: Right;   PROSTATECTOMY     sigmoid colectomy     TONSILLECTOMY      Allergies  Allergen Reactions   Gabapentin Other (See Comments)    Hallucinations   Norco [Hydrocodone-Acetaminophen] Other (See Comments)    Family feels this causes "cognitive impairment"   Potassium-Containing Compounds Hives, Diarrhea and Nausea And Vomiting   Benzodiazepines Itching, Swelling and Rash   Penicillins Hives, Swelling and Rash    Outpatient Encounter Medications as of 01/14/2022  Medication Sig   acetaminophen (TYLENOL) 500 MG tablet Take 1,000 mg by mouth at bedtime.   acetaminophen (TYLENOL) 500 MG tablet Take 1,000 mg by mouth as needed for moderate pain (TID prn for pain).   denosumab (PROLIA) 60 MG/ML SOSY injection Inject 60 mg into the skin every 6 (six) months.   ELIQUIS 5 MG TABS tablet Take 5 mg by mouth 2 (two) times daily.   metFORMIN (GLUCOPHAGE-XR) 500 MG 24 hr tablet Take 500 mg by mouth in the morning and at bedtime.   QUEtiapine (SEROQUEL) 12.5 mg TABS tablet Take 12.5 mg by mouth at bedtime.   simvastatin (ZOCOR) 20 MG tablet Take 20 mg by mouth every Monday, Wednesday, and Friday.   valACYclovir (VALTREX) 1000 MG tablet Take 1,000 mg by mouth daily as needed (as directed for the onset of a fever blister).   [DISCONTINUED] HYDROcodone-acetaminophen (NORCO/VICODIN) 5-325 MG tablet Take 1 tablet by mouth every 6 (six) hours as needed for moderate pain. (Patient not taking: Reported on 01/08/2022)   No facility-administered encounter medications on file as of 01/14/2022.    Review of Systems  Unable to perform ROS: Dementia    Immunization History  Administered Date(s) Administered   Influenza Split 12/03/2009, 11/25/2010, 03/03/2011, 11/10/2011, 12/09/2012, 11/17/2013   Influenza, Quadrivalent, Recombinant, Inj, Pf 10/30/2017,  11/04/2018, 11/29/2019, 12/07/2020   Influenza,inj,Quad PF,6+ Mos 11/17/2013, 11/03/2014   Influenza-Unspecified 11/02/2015, 11/28/2016   Moderna Covid-19 Vaccine Bivalent Booster 36yr & up 11/14/2020   Moderna SARS-COV2 Booster Vaccination 07/16/2020, 12/26/2021   Pneumococcal Polysaccharide-23 03/03/2011   Pneumococcal-Unspecified 05/10/2012   Td 03/03/2011   Tdap 01/19/2013, 02/13/2019   Zoster Recombinat (Shingrix) 04/10/2018   Zoster, Live 12/03/2009, 03/03/2011, 10/05/2017   Pertinent  Health Maintenance Due  Topic Date Due   FOOT EXAM  Never done   OPHTHALMOLOGY EXAM  Never done   INFLUENZA VACCINE  09/16/2021   HEMOGLOBIN A1C  07/01/2022      01/12/2022    8:00 AM 01/12/2022    7:15 PM 01/13/2022    1:00 AM 01/13/2022    8:00 AM 01/14/2022   10:12 AM  Fall Risk  Falls in the past year?     0  Was there an injury with Fall?     0  Fall Risk Category Calculator     0  Fall Risk Category     Low  Patient Fall Risk Level High fall risk High fall risk High fall risk High fall risk High fall risk  Patient at Risk for Falls Due to     History of fall(s)  Fall risk Follow up     Falls evaluation completed  Functional Status Survey:    Vitals:   01/14/22 1014  BP: (!) 170/96  Pulse: 66  Resp: 20  Temp: (!) 96.5 F (35.8 C)  SpO2: 99%  Weight: 175 lb 6 oz (79.5 kg)  Height: '5\' 11"'$  (1.803 m)   Body mass index is 24.46 kg/m. Physical Exam Vitals reviewed.  Constitutional:      General: He is not in acute distress. HENT:     Head: Normocephalic.  Eyes:     General:        Right eye: No discharge.        Left eye: No discharge.  Cardiovascular:     Rate and Rhythm: Normal rate and regular rhythm.     Pulses: Normal pulses.     Heart sounds: Normal heart sounds.  Pulmonary:     Effort: Pulmonary effort is normal. No respiratory distress.     Breath sounds: Normal breath sounds. No wheezing.  Abdominal:     General: Bowel sounds are normal. There is no  distension.     Palpations: Abdomen is soft.     Tenderness: There is no abdominal tenderness.  Musculoskeletal:     Cervical back: Neck supple.     Right lower leg: No edema.     Left lower leg: No edema.  Skin:    General: Skin is warm.     Capillary Refill: Capillary refill takes less than 2 seconds.     Comments: Small skin tear to right anterior wrist, CDI.   Neurological:     General: No focal deficit present.     Mental Status: He is alert. Mental status is at baseline.     Motor: Weakness present.     Gait: Gait abnormal.     Comments: wheelchair  Psychiatric:        Mood and Affect: Mood normal.     Comments: Unable to follow commands, aphasia, alert to self.      Labs reviewed: Recent Labs    01/09/22 0235 01/10/22 0223 01/10/22 2158 01/11/22 0558  NA 143 142  --  141  K 3.9 3.4*  --  3.5  CL 111 111  --  105  CO2 23 23  --  22  GLUCOSE 84 148*  --  139*  BUN 20 20  --  17  CREATININE 1.12 0.86  --  0.86  CALCIUM 8.2* 8.2*  --  9.0  MG  --   --  1.6*  --    Recent Labs    08/16/21 1443 01/08/22 0700 01/09/22 0235  AST '20 23 19  '$ ALT '24 30 24  '$ ALKPHOS 62 65 46  BILITOT 0.7 0.7 0.5  PROT 6.4* 7.4 5.6*  ALBUMIN 3.5 4.0 3.0*   Recent Labs    06/23/21 0255 06/23/21 0326 08/16/21 1443 12/31/21 1043 01/08/22 0700 01/09/22 0235 01/10/22 0223 01/11/22 0558  WBC 11.1*   < > 7.0   < > 5.9 5.7 5.3 7.7  NEUTROABS 8.4*  --  5.2  --  4.4  --   --   --   HGB 14.0   < > 13.1   < > 13.6 10.9* 10.7* 12.9*  HCT 40.1   < > 38.5*   < > 40.8 34.4* 32.2* 38.5*  MCV 90.1   < > 92.3   < > 90.1 95.0 93.9 90.2  PLT 213   < > 222   < > 184 163 145* 183   < > = values  in this interval not displayed.   Lab Results  Component Value Date   TSH 3.123 01/08/2022   Lab Results  Component Value Date   HGBA1C 8.2 (H) 12/31/2021   No results found for: "CHOL", "HDL", "LDLCALC", "LDLDIRECT", "TRIG", "CHOLHDL"  Significant Diagnostic Results in last 30 days:  CT Renal  Stone Study  Result Date: 01/08/2022 CLINICAL DATA:  Abdominal/flank pain, stone suspected. EXAM: CT ABDOMEN AND PELVIS WITHOUT CONTRAST TECHNIQUE: Multidetector CT imaging of the abdomen and pelvis was performed following the standard protocol without IV contrast. RADIATION DOSE REDUCTION: This exam was performed according to the departmental dose-optimization program which includes automated exposure control, adjustment of the mA and/or kV according to patient size and/or use of iterative reconstruction technique. COMPARISON:  CT 12/22/2021 and intraoperative fluoroscopy 01/06/2022 FINDINGS: Lower chest: Again noted are small cysts or blebs along the medial right lower lobe. Patchy densities at lung bases are suggestive for atelectasis. No large pleural effusions. Hepatobiliary: Normal appearance of the liver and gallbladder. Pancreas: Unremarkable. No pancreatic ductal dilatation or surrounding inflammatory changes. Spleen: Normal in size without focal abnormality. Adrenals/Urinary Tract: Normal adrenal glands. Stones in the right kidney lower pole, largest measuring 8 mm. Negative for right hydronephrosis. Negative for right ureter stones. Left ureter stent has been placed but the ureter stent has retracted. Proximal aspect of the stent is in the distal left ureter and this stent is extending out through the urethra. Stent appears to be extending out of the penis based on the topogram image. Left hydronephrosis has markedly decompressed. Evidence for parapelvic cysts or residual dilated calices in left kidney upper pole. Question tiny stones in left kidney lower pole. Distal left ureter stones have been removed. Urinary bladder is decompressed with gas within the bladder. Gas is likely related to recent instrumentation. Stomach/Bowel: Again noted is colon anterior to the liver. Normal appendix without inflammatory changes. No evidence for bowel obstruction and no focal bowel inflammation. Normal appearance of  the stomach. Again noted is gas-filled structure near the distal esophagus that measures up to 4.2 cm and likely represents a large distal esophageal diverticulum. Vascular/Lymphatic: Again noted is a prominent mesenteric lymph node measuring up to 1.6 cm on image 38/2. Overall, there is not significant lymph node enlargement in the abdomen or pelvis. Atherosclerotic calcifications in the abdominal aorta without aneurysm. Reproductive: Prostatectomy. Surgical clips in the pelvis and inguinal regions bilaterally. Other: Negative for free fluid.  Negative for free air. Musculoskeletal: Degenerative changes in both hips appear chronic. Multilevel disc disease in the lumbar spine with retrolisthesis at L3-L4. Facet arthropathy in the lumbar spine. No acute bone abnormality. Calcifications or postsurgical changes along the anterior abdominal wall. IMPRESSION: 1. Interval removal of the distal left ureter stones and placement of a left ureter stent. The left ureter stent is partially dislodged with only a small portion remaining in the distal left ureter. 2. Decreased left hydroureteronephrosis. Residual dilated calices in left upper pole and mild dilatation of the left ureter. 3. Nonobstructive right renal calculi. Probable tiny left renal calculi. 4. Prominent distal esophageal diverticulum without acute inflammatory changes. 5. Prostatectomy. Electronically Signed   By: Markus Daft M.D.   On: 01/08/2022 09:19   CT Head Wo Contrast  Result Date: 01/08/2022 CLINICAL DATA:  Altered mental status. Patient screaming and yelling at nursing home. EXAM: CT HEAD WITHOUT CONTRAST TECHNIQUE: Contiguous axial images were obtained from the base of the skull through the vertex without intravenous contrast. RADIATION DOSE REDUCTION: This exam was  performed according to the departmental dose-optimization program which includes automated exposure control, adjustment of the mA and/or kV according to patient size and/or use of  iterative reconstruction technique. COMPARISON:  CT head without contrast 06/23/2021 FINDINGS: Brain: Mild atrophy and moderate white matter changes are stable. No acute infarct, hemorrhage, or mass lesion is present. Deep brain nuclei are within normal limits. The ventricles are of normal size. No significant extraaxial fluid collection is present. The brainstem and cerebellum are within normal limits. Vascular: No hyperdense vessel or unexpected calcification. Skull: Calvarium is intact. No focal lytic or blastic lesions are present. No significant extracranial soft tissue lesion is present. Sinuses/Orbits: The paranasal sinuses and mastoid air cells are clear. Bilateral lens replacements are noted. Globes and orbits are otherwise unremarkable. IMPRESSION: 1. No acute intracranial abnormality or significant interval change. 2. Stable atrophy and moderate white matter disease. This likely reflects the sequela of chronic microvascular ischemia. Electronically Signed   By: San Morelle M.D.   On: 01/08/2022 08:05   DG Chest Port 1 View  Result Date: 01/08/2022 CLINICAL DATA:  Altered mental status. EXAM: PORTABLE CHEST 1 VIEW COMPARISON:  One-view chest x-ray 08/16/2021 FINDINGS: Heart size is exaggerated by low lung volumes. Atherosclerotic calcifications are present at the aortic arch. Lungs are clear. No edema or effusion is present. Degenerative changes are again noted in the shoulders. IMPRESSION: 1. Low lung volumes. 2. No acute cardiopulmonary disease. Electronically Signed   By: San Morelle M.D.   On: 01/08/2022 08:00    Assessment/Plan 1. Acute encephalopathy - hospitalized 11/23-11/28 - haldol prn and mittens used during hospitalization - advised to stop future use of benzodiazepines - cont Seroquel qhs - will start Seroquel BID prn for agitation  2. Sepsis secondary to UTI - urine culture grew Enterococcus faecalis, given IV ceftriaxone - blood cultures negative   3.  Cognitive impairment - continues to have hallucinations, no agitation - CT head indicates white matter disease, microvascular ischemia - no recent MMSE - hospice consult today- goals of care comfort based per family - cont skilled nursing  4. Urethral obstruction -  08/2021 CT chest detected 19 mm stone to left distal ureter with hydronephrosis  11/21 he underwent cystoscopy with left retrograde pyelogram and ureteroscopy with sten placement- Dr. Jeffie Pollock - he pulled strings attached to stent prematurely- no further intervention per urology - UOP stable - f/u [facility] 12/06  5. History of pulmonary embolism - diagnosed 07/2021 - cont Eliquis  6. Uncontrolled type 2 diabetes mellitus with hyperglycemia (HCC) - SSI during hospitalization - A1c 8.2 12/31/2021 - cont metformin - cont diabetic diet  7. Hyperlipidemia, unspecified hyperlipidemia type - cont statin  8. Post-polio syndrome - h/o right leg weakness - ambulates with w/c - cont skilled nursing     Family/ staff Communication: plan discussed with patient and nurse  Labs/tests ordered: none

## 2022-01-15 ENCOUNTER — Non-Acute Institutional Stay (SKILLED_NURSING_FACILITY): Payer: Medicare PPO | Admitting: Adult Health

## 2022-01-15 ENCOUNTER — Encounter: Payer: Self-pay | Admitting: Adult Health

## 2022-01-15 DIAGNOSIS — R339 Retention of urine, unspecified: Secondary | ICD-10-CM

## 2022-01-15 NOTE — Progress Notes (Signed)
Location:  Occupational psychologist of Service:  SNF (31) Provider:   Cindi Carbon, ANP [facility] [phone]   Crist Infante, MD  Patient Care Team: Crist Infante, MD as PCP - General (Internal Medicine)  Extended Emergency Contact Information Primary Emergency Contact: CRESTON, KLAS Address: [address] [place] [address]          [place],  [place] Phone: [phone] Mobile Phone: [phone] Relation: Spouse Secondary Emergency Contact: [place] Mobile Phone: [phone] Relation: Daughter  Code Status:  DNR Goals of care: Advanced Directive information    01/14/2022   10:13 AM  Advanced Directives  Does Patient Have a Medical Advance Directive? Yes  Type of Advance Directive Living will;Out of facility DNR (pink MOST or yellow form)  Does patient want to make changes to medical advance directive? No - Patient declined     Chief Complaint  Patient presents with   Acute Visit    Urinary retention     HPI:  Pt is a 88 y.o. male seen today for an acute visit for urinary retention.   He currently resides on the skilled nursing unit at [facility] PMH: PE, OSA, HLD,T2DM, post polio syndrome, prostate cancer s/p prostatectomy 1995, PSA recurrence with adjuvant radiation 2005, renal stones 2016, and cognitive impairment.     01/06/2022 he underwent cystoscopy with left retrograde pyelogram and ureteroscopy with stent placement. He was discharged to [facility] and became agitated and delirious. He had two strings at the penis he was pulling on during that time. He was sent back to the ER 11/23. He was hypothermic and diagnosis with sepsis from UTI and acute metabolic encephalopathy. He was treated but continued to have delirium and need seroquel. Discharged back to [facility] with significant decline in cognition and admitted to hospice today.  He is not eating and drinking well. No urine out put since last night. No fever  or bladder pain. Appears comfortable. Mostly sleeping. States he needs to have a BM. Family at bedside wanting comfort care.   Past Medical History:  Diagnosis Date   Adenomatous colon polyp    Allergic rhinitis    Arthritis    CHF (congestive heart failure) (HCC)    Chronic kidney disease    GERD (gastroesophageal reflux disease)    Hyperlipidemia    Hypertension    Polio 02/16/1937   Post-polio syndrome    Prostate cancer ([place])    Saddle pulmonary embolus (HCC)    Skin cancer (melanoma) ([place])    Right eye   Past Surgical History:  Procedure Laterality Date   cataract surgery     bilateral   CYSTOSCOPY WITH RETROGRADE PYELOGRAM, URETEROSCOPY AND STENT PLACEMENT Left 01/06/2022   Procedure: CYSTOSCOPY WITH LEFT RETROGRADE PYELOGRAM, URETEROSCOPY WITH  HOLMIUM LASER  AND STENT PLACEMENT;  Surgeon: Irine Seal, MD;  Location: [facility];  Service: Urology;  Laterality: Left;  1 HR FOR CASE   LUMBAR EPIDURAL INJECTION     LUMBAR LAMINECTOMY/DECOMPRESSION MICRODISCECTOMY Right 07/19/2013   Procedure: LUMBAR LAMINECTOMY/DECOMPRESSION MICRODISCECTOMY 2 LEVELS     lumbar  three/four,  four/five;  Surgeon: Eustace Moore, MD;  Location: [facility];  Service: Neurosurgery;  Laterality: Right;   PROSTATECTOMY     sigmoid colectomy     TONSILLECTOMY      Allergies  Allergen Reactions   Gabapentin Other (See Comments)    Hallucinations   Norco [Hydrocodone-Acetaminophen] Other (See Comments)    Family feels this causes "cognitive impairment"  Potassium-Containing Compounds Hives, Diarrhea and Nausea And Vomiting   Benzodiazepines Itching, Swelling and Rash   Penicillins Hives, Swelling and Rash    Outpatient Encounter Medications as of 01/15/2022  Medication Sig   acetaminophen (TYLENOL) 500 MG tablet Take 1,000 mg by mouth at bedtime.   acetaminophen (TYLENOL) 500 MG tablet Take 1,000 mg by mouth as needed for moderate pain (TID prn for pain).   denosumab (PROLIA) 60 MG/ML SOSY  injection Inject 60 mg into the skin every 6 (six) months.   ELIQUIS 5 MG TABS tablet Take 5 mg by mouth 2 (two) times daily.   metFORMIN (GLUCOPHAGE-XR) 500 MG 24 hr tablet Take 500 mg by mouth in the morning and at bedtime.   QUEtiapine (SEROQUEL) 12.5 mg TABS tablet Take 12.5 mg by mouth at bedtime.   simvastatin (ZOCOR) 20 MG tablet Take 20 mg by mouth every Monday, Wednesday, and Friday.   valACYclovir (VALTREX) 1000 MG tablet Take 1,000 mg by mouth daily as needed (as directed for the onset of a fever blister).   No facility-administered encounter medications on file as of 01/15/2022.    Review of Systems  Unable to perform ROS: Acuity of condition    Immunization History  Administered Date(s) Administered   Influenza Split 12/03/2009, 11/25/2010, 03/03/2011, 11/10/2011, 12/09/2012, 11/17/2013   Influenza, Quadrivalent, Recombinant, Inj, Pf 10/30/2017, 11/04/2018, 11/29/2019, 12/07/2020   Influenza,inj,Quad PF,6+ Mos 11/17/2013, 11/03/2014   Influenza-Unspecified 11/02/2015, 11/28/2016   Moderna Covid-19 Vaccine Bivalent Booster 72yr & up 11/14/2020   Moderna SARS-COV2 Booster Vaccination 07/16/2020, 12/26/2021   Pneumococcal Polysaccharide-23 03/03/2011   Pneumococcal-Unspecified 05/10/2012   Td 03/03/2011   Tdap 01/19/2013, 02/13/2019   Zoster Recombinat (Shingrix) 04/10/2018   Zoster, Live 12/03/2009, 03/03/2011, 10/05/2017   Pertinent  Health Maintenance Due  Topic Date Due   FOOT EXAM  Never done   OPHTHALMOLOGY EXAM  Never done   INFLUENZA VACCINE  09/16/2021   HEMOGLOBIN A1C  07/01/2022      01/12/2022    8:00 AM 01/12/2022    7:15 PM 01/13/2022    1:00 AM 01/13/2022    8:00 AM 01/14/2022   10:12 AM  Fall Risk  Falls in the past year?     0  Was there an injury with Fall?     0  Fall Risk Category Calculator     0  Fall Risk Category     Low  Patient Fall Risk Level High fall risk High fall risk High fall risk High fall risk High fall risk  Patient at  Risk for Falls Due to     History of fall(s)  Fall risk Follow up     Falls evaluation completed   Functional Status Survey:    Vitals:   01/15/22 1822  BP: (!) 168/84  Pulse: 65  Resp: 19  Temp: (!) 96.7 F (35.9 C)  SpO2: 96%   There is no height or weight on file to calculate BMI. Physical Exam Vitals and nursing note reviewed.  Constitutional:      General: He is not in acute distress.    Appearance: He is not diaphoretic.  HENT:     Head: Normocephalic and atraumatic.  Neck:     Thyroid: No thyromegaly.     Vascular: No JVD.     Trachea: No tracheal deviation.  Cardiovascular:     Rate and Rhythm: Normal rate. Rhythm irregular.     Heart sounds: No murmur heard. Pulmonary:     Effort: Pulmonary effort  is normal. No respiratory distress.     Breath sounds: Normal breath sounds. No wheezing.  Abdominal:     Comments: Hypoactive bowel sounds x 4 Slight distention noted. NO tenderness noted  Lymphadenopathy:     Cervical: No cervical adenopathy.  Skin:    General: Skin is warm and dry.  Neurological:     Mental Status: He is alert.     Comments: Asleep, able to answer questions periodically.      Labs reviewed: Recent Labs    01/09/22 0235 01/10/22 0223 01/10/22 2158 01/11/22 0558  NA 143 142  --  141  K 3.9 3.4*  --  3.5  CL 111 111  --  105  CO2 23 23  --  22  GLUCOSE 84 148*  --  139*  BUN 20 20  --  17  CREATININE 1.12 0.86  --  0.86  CALCIUM 8.2* 8.2*  --  9.0  MG  --   --  1.6*  --    Recent Labs    08/16/21 1443 01/08/22 0700 01/09/22 0235  AST '20 23 19  '$ ALT '24 30 24  '$ ALKPHOS 62 65 46  BILITOT 0.7 0.7 0.5  PROT 6.4* 7.4 5.6*  ALBUMIN 3.5 4.0 3.0*   Recent Labs    06/23/21 0255 06/23/21 0326 08/16/21 1443 12/31/21 1043 01/08/22 0700 01/09/22 0235 01/10/22 0223 01/11/22 0558  WBC 11.1*   < > 7.0   < > 5.9 5.7 5.3 7.7  NEUTROABS 8.4*  --  5.2  --  4.4  --   --   --   HGB 14.0   < > 13.1   < > 13.6 10.9* 10.7* 12.9*  HCT 40.1    < > 38.5*   < > 40.8 34.4* 32.2* 38.5*  MCV 90.1   < > 92.3   < > 90.1 95.0 93.9 90.2  PLT 213   < > 222   < > 184 163 145* 183   < > = values in this interval not displayed.   Lab Results  Component Value Date   TSH 3.123 01/08/2022   Lab Results  Component Value Date   HGBA1C 8.2 (H) 12/31/2021   No results found for: "CHOL", "HDL", "LDLCALC", "LDLDIRECT", "TRIG", "CHOLHDL"  Significant Diagnostic Results in last 30 days:  CT Renal Stone Study  Result Date: 01/08/2022 CLINICAL DATA:  Abdominal/flank pain, stone suspected. EXAM: CT ABDOMEN AND PELVIS WITHOUT CONTRAST TECHNIQUE: Multidetector CT imaging of the abdomen and pelvis was performed following the standard protocol without IV contrast. RADIATION DOSE REDUCTION: This exam was performed according to the departmental dose-optimization program which includes automated exposure control, adjustment of the mA and/or kV according to patient size and/or use of iterative reconstruction technique. COMPARISON:  CT 12/22/2021 and intraoperative fluoroscopy 01/06/2022 FINDINGS: Lower chest: Again noted are small cysts or blebs along the medial right lower lobe. Patchy densities at lung bases are suggestive for atelectasis. No large pleural effusions. Hepatobiliary: Normal appearance of the liver and gallbladder. Pancreas: Unremarkable. No pancreatic ductal dilatation or surrounding inflammatory changes. Spleen: Normal in size without focal abnormality. Adrenals/Urinary Tract: Normal adrenal glands. Stones in the right kidney lower pole, largest measuring 8 mm. Negative for right hydronephrosis. Negative for right ureter stones. Left ureter stent has been placed but the ureter stent has retracted. Proximal aspect of the stent is in the distal left ureter and this stent is extending out through the urethra. Stent appears to be extending out of the  penis based on the topogram image. Left hydronephrosis has markedly decompressed. Evidence for parapelvic  cysts or residual dilated calices in left kidney upper pole. Question tiny stones in left kidney lower pole. Distal left ureter stones have been removed. Urinary bladder is decompressed with gas within the bladder. Gas is likely related to recent instrumentation. Stomach/Bowel: Again noted is colon anterior to the liver. Normal appendix without inflammatory changes. No evidence for bowel obstruction and no focal bowel inflammation. Normal appearance of the stomach. Again noted is gas-filled structure near the distal esophagus that measures up to 4.2 cm and likely represents a large distal esophageal diverticulum. Vascular/Lymphatic: Again noted is a prominent mesenteric lymph node measuring up to 1.6 cm on image 38/2. Overall, there is not significant lymph node enlargement in the abdomen or pelvis. Atherosclerotic calcifications in the abdominal aorta without aneurysm. Reproductive: Prostatectomy. Surgical clips in the pelvis and inguinal regions bilaterally. Other: Negative for free fluid.  Negative for free air. Musculoskeletal: Degenerative changes in both hips appear chronic. Multilevel disc disease in the lumbar spine with retrolisthesis at L3-L4. Facet arthropathy in the lumbar spine. No acute bone abnormality. Calcifications or postsurgical changes along the anterior abdominal wall. IMPRESSION: 1. Interval removal of the distal left ureter stones and placement of a left ureter stent. The left ureter stent is partially dislodged with only a small portion remaining in the distal left ureter. 2. Decreased left hydroureteronephrosis. Residual dilated calices in left upper pole and mild dilatation of the left ureter. 3. Nonobstructive right renal calculi. Probable tiny left renal calculi. 4. Prominent distal esophageal diverticulum without acute inflammatory changes. 5. Prostatectomy. Electronically Signed   By: Markus Daft M.D.   On: 01/08/2022 09:19   CT Head Wo Contrast  Result Date: 01/08/2022 CLINICAL  DATA:  Altered mental status. Patient screaming and yelling at nursing home. EXAM: CT HEAD WITHOUT CONTRAST TECHNIQUE: Contiguous axial images were obtained from the base of the skull through the vertex without intravenous contrast. RADIATION DOSE REDUCTION: This exam was performed according to the departmental dose-optimization program which includes automated exposure control, adjustment of the mA and/or kV according to patient size and/or use of iterative reconstruction technique. COMPARISON:  CT head without contrast 06/23/2021 FINDINGS: Brain: Mild atrophy and moderate white matter changes are stable. No acute infarct, hemorrhage, or mass lesion is present. Deep brain nuclei are within normal limits. The ventricles are of normal size. No significant extraaxial fluid collection is present. The brainstem and cerebellum are within normal limits. Vascular: No hyperdense vessel or unexpected calcification. Skull: Calvarium is intact. No focal lytic or blastic lesions are present. No significant extracranial soft tissue lesion is present. Sinuses/Orbits: The paranasal sinuses and mastoid air cells are clear. Bilateral lens replacements are noted. Globes and orbits are otherwise unremarkable. IMPRESSION: 1. No acute intracranial abnormality or significant interval change. 2. Stable atrophy and moderate white matter disease. This likely reflects the sequela of chronic microvascular ischemia. Electronically Signed   By: San Morelle M.D.   On: 01/08/2022 08:05   DG Chest Port 1 View  Result Date: 01/08/2022 CLINICAL DATA:  Altered mental status. EXAM: PORTABLE CHEST 1 VIEW COMPARISON:  One-view chest x-ray 08/16/2021 FINDINGS: Heart size is exaggerated by low lung volumes. Atherosclerotic calcifications are present at the aortic arch. Lungs are clear. No edema or effusion is present. Degenerative changes are again noted in the shoulders. IMPRESSION: 1. Low lung volumes. 2. No acute cardiopulmonary disease.  Electronically Signed   By: Wynetta Fines.D.  On: 01/08/2022 08:00   DG C-Arm 1-60 Min-No Report  Result Date: 01/06/2022 Fluoroscopy was utilized by the requesting physician.  No radiographic interpretation.   DG C-Arm 1-60 Min-No Report  Result Date: 01/06/2022 Fluoroscopy was utilized by the requesting physician.  No radiographic interpretation.    Assessment/Plan 1. Urinary retention PVR 501 cc Wife and daughter at bedside.  Pt is not eating and drinking adequately, sleeping, nearing end of life Family requesting no aggressive measures, discontinuing any not needed meds, and avoiding procedures. He does not appear to be in discomfort. Will order I and O cath q shift prn for retention with discomfort  2. Constipation  Add dulcolax prn  D/C zocor and metformin and cbgs due to goals of care, end of life.      Cindi Carbon, ANP [facility] [phone]

## 2022-01-19 ENCOUNTER — Encounter: Payer: Self-pay | Admitting: Internal Medicine

## 2022-01-19 ENCOUNTER — Non-Acute Institutional Stay (SKILLED_NURSING_FACILITY): Admitting: Internal Medicine

## 2022-01-19 DIAGNOSIS — Z86711 Personal history of pulmonary embolism: Secondary | ICD-10-CM

## 2022-01-19 DIAGNOSIS — G14 Postpolio syndrome: Secondary | ICD-10-CM | POA: Diagnosis not present

## 2022-01-19 DIAGNOSIS — E1165 Type 2 diabetes mellitus with hyperglycemia: Secondary | ICD-10-CM

## 2022-01-19 DIAGNOSIS — G4731 Primary central sleep apnea: Secondary | ICD-10-CM | POA: Diagnosis not present

## 2022-01-19 DIAGNOSIS — R4189 Other symptoms and signs involving cognitive functions and awareness: Secondary | ICD-10-CM

## 2022-01-19 DIAGNOSIS — N368 Other specified disorders of urethra: Secondary | ICD-10-CM

## 2022-01-19 DIAGNOSIS — R339 Retention of urine, unspecified: Secondary | ICD-10-CM

## 2022-01-19 DIAGNOSIS — E785 Hyperlipidemia, unspecified: Secondary | ICD-10-CM

## 2022-01-19 DIAGNOSIS — G9340 Encephalopathy, unspecified: Secondary | ICD-10-CM

## 2022-01-19 NOTE — Progress Notes (Signed)
Provider:   Location:  [facility] Room Number: 371G Place of Service:  SNF (31)  PCP: Crist Infante, MD Patient Care Team: Crist Infante, MD as PCP - General (Internal Medicine)  Extended Emergency Contact Information Primary Emergency Contact: Denzil Hughes Address: [address] [place] [address]          [place],  [place] Phone: [phone] Mobile Phone: [phone] Relation: Spouse Secondary Emergency Contact: [place] Mobile Phone: [phone] Relation: Daughter  Code Status: DNR/ Hospice Goals of Care: Advanced Directive information    01/19/2022    9:33 AM  Advanced Directives  Does Patient Have a Medical Advance Directive? Yes  Type of Advance Directive Living will;Healthcare Power of [place];Out of facility DNR (pink MOST or yellow form)  Does patient want to make changes to medical advance directive? No - Patient declined  Copy of [place] in Chart? Yes - validated most recent copy scanned in chart (See row information)      Chief Complaint  Patient presents with   New Admit To SNF    New admission to SNF   Immunizations    Discussed the need for pneumonia, shingles , covid and flu vaccine   Quality Metric Gaps    Discussed the need for foot exam and eye exam    HPI: Patient is a 88 y.o. male seen today for admission to SNF  Admitted in the hospital 11/23-11/28 for Sepsis Due to UTI  Patient has h/o Diabetes Mellitus, Post Polio Syndrome HLD and Complex Sleep Apnea  Progressive Dementia Was seen by Urology on 01/06/22 for UVJ with Obstruction and Hydronephrosis  Had Stone extraction and Double J stent placement  Was in SNF when he got agitated with Hallucinations Pulled his urethral stent out was send to ED  Was found to have Sepsis due to UTI Treated with IV antibiotics Discharged back to SNF  Was not eating and drinking well and PVR 501 Family wants him ot be comfort  care Admitted to Hospice He was restless last night and then sleeping in the morning More awake now. Ate his lunch and breakfast Responding well C/o Some discomfort in Right Leg But no other complains      Past Medical History:  Diagnosis Date   Adenomatous colon polyp    Allergic rhinitis    Arthritis    CHF (congestive heart failure) (HCC)    Chronic kidney disease    GERD (gastroesophageal reflux disease)    Hyperlipidemia    Hypertension    Polio 02/16[birth year]1937   Post-polio syndrome    Prostate cancer ([place])    Saddle pulmonary embolus (HCC)    Skin cancer (melanoma) ([place])    Right eye   Past Surgical History:  Procedure Laterality Date   cataract surgery     bilateral   CYSTOSCOPY WITH RETROGRADE PYELOGRAM, URETEROSCOPY AND STENT PLACEMENT Left 01/06/2022   Procedure: CYSTOSCOPY WITH LEFT RETROGRADE PYELOGRAM, URETEROSCOPY WITH  HOLMIUM LASER  AND STENT PLACEMENT;  Surgeon: Irine Seal, MD;  Location: [facility];  Service: Urology;  Laterality: Left;  1 HR FOR CASE   LUMBAR EPIDURAL INJECTION     LUMBAR LAMINECTOMY/DECOMPRESSION MICRODISCECTOMY Right 07/19/2013   Procedure: LUMBAR LAMINECTOMY/DECOMPRESSION MICRODISCECTOMY 2 LEVELS     lumbar  three/four,  four/five;  Surgeon: Eustace Moore, MD;  Location: [facility];  Service: Neurosurgery;  Laterality: Right;   PROSTATECTOMY     sigmoid colectomy     TONSILLECTOMY  reports that he has quit smoking. He has never used smokeless tobacco. He reports current alcohol use. He reports that he does not use drugs. Social History   Socioeconomic History   Marital status: Married    Spouse name: Not on file   Number of children: 1   Years of education: Not on file   Highest education level: Not on file  Occupational History   Occupation: retired  Tobacco Use   Smoking status: Former   Smokeless tobacco: Never   Tobacco comments:    has occasional cigar ;stopped cigarettes in 1965  Vaping Use   Vaping Use: Never used   Substance and Sexual Activity   Alcohol use: Yes    Alcohol/week: 0.0 standard drinks of alcohol    Comment: 2 oz scotch, 4 oz wine   Drug use: No   Sexual activity: Not Currently    Birth control/protection: None  Other Topics Concern   Not on file  Social History Narrative   Not on file   Social Determinants of Health   Financial Resource Strain: Not on file  Food Insecurity: No Food Insecurity (01/09/2022)   Hunger Vital Sign    Worried About Running Out of Food in the Last Year: Never true    Ran Out of Food in the Last Year: Never true  Transportation Needs: No Transportation Needs (01/09/2022)   PRAPARE - Hydrologist (Medical): No    Lack of Transportation (Non-Medical): No  Physical Activity: Not on file  Stress: Not on file  Social Connections: Not on file  Intimate Partner Violence: Not At Risk (01/09/2022)   Humiliation, Afraid, Rape, and Kick questionnaire    Fear of Current or Ex-Partner: No    Emotionally Abused: No    Physically Abused: No    Sexually Abused: No    Functional Status Survey:    Family History  Problem Relation Age of Onset   Pancreatic cancer Father    Cancer Father    Heart failure Mother    Heart disease Mother    Colon cancer Neg Hx     Health Maintenance  Topic Date Due   FOOT EXAM  Never done   OPHTHALMOLOGY EXAM  Never done   Pneumonia Vaccine 75+ Years old (2 - PCV) 05/10/2013   Zoster Vaccines- Shingrix (2 of 2) 06/05/2018   COVID-19 Vaccine (2 - Moderna risk series) 01/23/2022   INFLUENZA VACCINE  05/17/2022 (Originally 09/16/2021)   HEMOGLOBIN A1C  07/01/2022   DTaP/Tdap/Td (4 - Td or Tdap) 02/12/2029   HPV VACCINES  Aged Out    Allergies  Allergen Reactions   Gabapentin Other (See Comments)    Hallucinations   Norco [Hydrocodone-Acetaminophen] Other (See Comments)    Family feels this causes "cognitive impairment"   Potassium-Containing Compounds Hives, Diarrhea and Nausea And  Vomiting   Benzodiazepines Itching, Swelling and Rash   Penicillins Hives, Swelling and Rash    Outpatient Encounter Medications as of 01/19/2022  Medication Sig   acetaminophen (TYLENOL) 500 MG tablet Take 1,000 mg by mouth at bedtime.   acetaminophen (TYLENOL) 500 MG tablet Take 1,000 mg by mouth as needed for moderate pain (TID prn for pain).   bisacodyl (DULCOLAX) 10 MG suppository Place 10 mg rectally as needed for moderate constipation.   denosumab (PROLIA) 60 MG/ML SOSY injection Inject 60 mg into the skin every 6 (six) months.   ELIQUIS 5 MG TABS tablet Take 5 mg by mouth 2 (two) times  daily.   QUEtiapine (SEROQUEL) 25 MG tablet Take 25 mg by mouth at bedtime. Take 12.5 mg by mouth at bedtime   QUEtiapine (SEROQUEL) 25 MG tablet Take 25 mg by mouth 2 (two) times daily as needed. Take 12.5 MG PO 2 times daily as needed for increased agitation.   valACYclovir (VALTREX) 1000 MG tablet Take 1,000 mg by mouth daily as needed (as directed for the onset of a fever blister).   No facility-administered encounter medications on file as of 01/19/2022.    Review of Systems  Constitutional:  Negative for activity change, appetite change and unexpected weight change.  HENT: Negative.    Respiratory:  Negative for cough and shortness of breath.   Cardiovascular:  Negative for leg swelling.  Gastrointestinal:  Negative for constipation.  Genitourinary:  Negative for frequency.  Musculoskeletal:  Positive for gait problem. Negative for arthralgias and myalgias.  Skin: Negative.  Negative for rash.  Neurological:  Positive for weakness. Negative for dizziness.  Psychiatric/Behavioral:  Positive for confusion and sleep disturbance. The patient is nervous/anxious.   All other systems reviewed and are negative.   Vitals:   01/19/22 0858  BP: (!) 140/83  Pulse: 69  Resp: 18  Temp: (!) 97.2 F (36.2 C)  TempSrc: Temporal  SpO2: 96%  Weight: 170 lb (77.1 kg)  Height: '5\' 11"'$  (1.803 m)    Body mass index is 23.71 kg/m. Physical Exam Vitals reviewed.  Constitutional:      Appearance: Normal appearance.  HENT:     Head: Normocephalic.     Nose: Nose normal.     Mouth/Throat:     Mouth: Mucous membranes are moist.     Pharynx: Oropharynx is clear.  Eyes:     Pupils: Pupils are equal, round, and reactive to light.  Cardiovascular:     Rate and Rhythm: Normal rate and regular rhythm.     Pulses: Normal pulses.     Heart sounds: No murmur heard. Pulmonary:     Effort: Pulmonary effort is normal. No respiratory distress.     Breath sounds: Normal breath sounds. No rales.  Abdominal:     General: Abdomen is flat. Bowel sounds are normal.     Palpations: Abdomen is soft.  Musculoskeletal:        General: No swelling.     Cervical back: Neck supple.     Comments: Right leg swelling present  Skin:    General: Skin is warm.  Neurological:     Mental Status: He is alert.     Comments: Alert responding appropriately  Right Leg weakness  Psychiatric:        Mood and Affect: Mood normal.        Thought Content: Thought content normal.     Labs reviewed: Basic Metabolic Panel: Recent Labs    01/09/22 0235 01/10/22 0223 01/10/22 2158 01/11/22 0558  NA 143 142  --  141  K 3.9 3.4*  --  3.5  CL 111 111  --  105  CO2 23 23  --  22  GLUCOSE 84 148*  --  139*  BUN 20 20  --  17  CREATININE 1.12 0.86  --  0.86  CALCIUM 8.2* 8.2*  --  9.0  MG  --   --  1.6*  --    Liver Function Tests: Recent Labs    08/16/21 1443 01/08/22 0700 01/09/22 0235  AST '20 23 19  '$ ALT '24 30 24  '$ ALKPHOS 62 65 46  BILITOT 0.7 0.7 0.5  PROT 6.4* 7.4 5.6*  ALBUMIN 3.5 4.0 3.0*   No results for input(s): "LIPASE", "AMYLASE" in the last 8760 hours. Recent Labs    06/20/21 1710 06/23/21 0645  AMMONIA 19 20   CBC: Recent Labs    06/23/21 0255 06/23/21 0326 08/16/21 1443 12/31/21 1043 01/08/22 0700 01/09/22 0235 01/10/22 0223 01/11/22 0558  WBC 11.1*   < > 7.0   < >  5.9 5.7 5.3 7.7  NEUTROABS 8.4*  --  5.2  --  4.4  --   --   --   HGB 14.0   < > 13.1   < > 13.6 10.9* 10.7* 12.9*  HCT 40.1   < > 38.5*   < > 40.8 34.4* 32.2* 38.5*  MCV 90.1   < > 92.3   < > 90.1 95.0 93.9 90.2  PLT 213   < > 222   < > 184 163 145* 183   < > = values in this interval not displayed.   Cardiac Enzymes: No results for input(s): "CKTOTAL", "CKMB", "CKMBINDEX", "TROPONINI" in the last 8760 hours. BNP: Invalid input(s): "POCBNP" Lab Results  Component Value Date   HGBA1C 8.2 (H) 12/31/2021   Lab Results  Component Value Date   TSH 3.123 01/08/2022   Lab Results  Component Value Date   [id number] 269 06/23/2021   No results found for: "FOLATE" No results found for: "IRON", "TIBC", "FERRITIN"  Imaging and Procedures obtained prior to SNF admission: CT Renal Stone Study  Result Date: 01/08/2022 CLINICAL DATA:  Abdominal/flank pain, stone suspected. EXAM: CT ABDOMEN AND PELVIS WITHOUT CONTRAST TECHNIQUE: Multidetector CT imaging of the abdomen and pelvis was performed following the standard protocol without IV contrast. RADIATION DOSE REDUCTION: This exam was performed according to the departmental dose-optimization program which includes automated exposure control, adjustment of the mA and/or kV according to patient size and/or use of iterative reconstruction technique. COMPARISON:  CT 12/22/2021 and intraoperative fluoroscopy 01/06/2022 FINDINGS: Lower chest: Again noted are small cysts or blebs along the medial right lower lobe. Patchy densities at lung bases are suggestive for atelectasis. No large pleural effusions. Hepatobiliary: Normal appearance of the liver and gallbladder. Pancreas: Unremarkable. No pancreatic ductal dilatation or surrounding inflammatory changes. Spleen: Normal in size without focal abnormality. Adrenals/Urinary Tract: Normal adrenal glands. Stones in the right kidney lower pole, largest measuring 8 mm. Negative for right hydronephrosis. Negative  for right ureter stones. Left ureter stent has been placed but the ureter stent has retracted. Proximal aspect of the stent is in the distal left ureter and this stent is extending out through the urethra. Stent appears to be extending out of the penis based on the topogram image. Left hydronephrosis has markedly decompressed. Evidence for parapelvic cysts or residual dilated calices in left kidney upper pole. Question tiny stones in left kidney lower pole. Distal left ureter stones have been removed. Urinary bladder is decompressed with gas within the bladder. Gas is likely related to recent instrumentation. Stomach/Bowel: Again noted is colon anterior to the liver. Normal appendix without inflammatory changes. No evidence for bowel obstruction and no focal bowel inflammation. Normal appearance of the stomach. Again noted is gas-filled structure near the distal esophagus that measures up to 4.2 cm and likely represents a large distal esophageal diverticulum. Vascular/Lymphatic: Again noted is a prominent mesenteric lymph node measuring up to 1.6 cm on image 38/2. Overall, there is not significant lymph node enlargement in the abdomen or pelvis. Atherosclerotic calcifications in  the abdominal aorta without aneurysm. Reproductive: Prostatectomy. Surgical clips in the pelvis and inguinal regions bilaterally. Other: Negative for free fluid.  Negative for free air. Musculoskeletal: Degenerative changes in both hips appear chronic. Multilevel disc disease in the lumbar spine with retrolisthesis at L3-L4. Facet arthropathy in the lumbar spine. No acute bone abnormality. Calcifications or postsurgical changes along the anterior abdominal wall. IMPRESSION: 1. Interval removal of the distal left ureter stones and placement of a left ureter stent. The left ureter stent is partially dislodged with only a small portion remaining in the distal left ureter. 2. Decreased left hydroureteronephrosis. Residual dilated calices in left  upper pole and mild dilatation of the left ureter. 3. Nonobstructive right renal calculi. Probable tiny left renal calculi. 4. Prominent distal esophageal diverticulum without acute inflammatory changes. 5. Prostatectomy. Electronically Signed   By: Markus Daft M.D.   On: 01/08/2022 09:19   CT Head Wo Contrast  Result Date: 01/08/2022 CLINICAL DATA:  Altered mental status. Patient screaming and yelling at nursing home. EXAM: CT HEAD WITHOUT CONTRAST TECHNIQUE: Contiguous axial images were obtained from the base of the skull through the vertex without intravenous contrast. RADIATION DOSE REDUCTION: This exam was performed according to the departmental dose-optimization program which includes automated exposure control, adjustment of the mA and/or kV according to patient size and/or use of iterative reconstruction technique. COMPARISON:  CT head without contrast 06/23/2021 FINDINGS: Brain: Mild atrophy and moderate white matter changes are stable. No acute infarct, hemorrhage, or mass lesion is present. Deep brain nuclei are within normal limits. The ventricles are of normal size. No significant extraaxial fluid collection is present. The brainstem and cerebellum are within normal limits. Vascular: No hyperdense vessel or unexpected calcification. Skull: Calvarium is intact. No focal lytic or blastic lesions are present. No significant extracranial soft tissue lesion is present. Sinuses/Orbits: The paranasal sinuses and mastoid air cells are clear. Bilateral lens replacements are noted. Globes and orbits are otherwise unremarkable. IMPRESSION: 1. No acute intracranial abnormality or significant interval change. 2. Stable atrophy and moderate white matter disease. This likely reflects the sequela of chronic microvascular ischemia. Electronically Signed   By: San Morelle M.D.   On: 01/08/2022 08:05   DG Chest Port 1 View  Result Date: 01/08/2022 CLINICAL DATA:  Altered mental status. EXAM: PORTABLE  CHEST 1 VIEW COMPARISON:  One-view chest x-ray 08/16/2021 FINDINGS: Heart size is exaggerated by low lung volumes. Atherosclerotic calcifications are present at the aortic arch. Lungs are clear. No edema or effusion is present. Degenerative changes are again noted in the shoulders. IMPRESSION: 1. Low lung volumes. 2. No acute cardiopulmonary disease. Electronically Signed   By: San Morelle M.D.   On: 01/08/2022 08:00    Assessment/Plan 1. Acute encephalopathy Combination of meds and Infection UTI  CT head Stable atropy and moderate white matter disease Has sitter Will also start on vetaril 10 mg Q 6 prn for restlesness Do not use Benzos due to severe reaction 2. Urinary retention Use PRN  catheter for now So Far doing well  3. Cognitive impairment On Seroquel Enrolled in hospice  4. Urethral obstruction Per Urology no Intervention 08/2021 CT chest detected 19 mm stone to left distal ureter with hydronephrosis  11/21 he underwent cystoscopy with left retrograde pyelogram and ureteroscopy with stent placement- Dr. Jeffie Pollock 5. History of pulmonary embolism Eliquis  6. Uncontrolled type 2 diabetes mellitus with hyperglycemia ([place]) Off metofromin due to goals of care  7. Hyperlipidemia, unspecified hyperlipidemia type Off statin  8. Post-polio syndrome   9. Complex sleep apnea syndrome     Family/ staff Communication:   Labs/tests ordered:

## 2022-01-28 ENCOUNTER — Telehealth: Payer: Self-pay | Admitting: Internal Medicine

## 2022-01-28 NOTE — Telephone Encounter (Signed)
Patient had acute agitation yesterday. Kicked and spitted on the [employer] gave order for I/M ativan which seemed to help him and make him sleepy I am discontinuing Seroquel and starting him on Haldol 1 mg TID hold for sedation and Ativan 1 mg Q 4 hours PRN Discontinue vistaril also . Needs Follow up tomorrow

## 2022-01-29 ENCOUNTER — Encounter: Payer: Self-pay | Admitting: Adult Health

## 2022-01-29 ENCOUNTER — Non-Acute Institutional Stay (SKILLED_NURSING_FACILITY): Payer: Medicare PPO | Admitting: Adult Health

## 2022-01-29 DIAGNOSIS — R339 Retention of urine, unspecified: Secondary | ICD-10-CM | POA: Diagnosis not present

## 2022-01-29 DIAGNOSIS — N368 Other specified disorders of urethra: Secondary | ICD-10-CM | POA: Diagnosis not present

## 2022-01-29 DIAGNOSIS — G14 Postpolio syndrome: Secondary | ICD-10-CM | POA: Diagnosis not present

## 2022-01-29 DIAGNOSIS — G9340 Encephalopathy, unspecified: Secondary | ICD-10-CM

## 2022-01-29 DIAGNOSIS — R638 Other symptoms and signs concerning food and fluid intake: Secondary | ICD-10-CM

## 2022-01-29 DIAGNOSIS — R4189 Other symptoms and signs involving cognitive functions and awareness: Secondary | ICD-10-CM

## 2022-01-29 MED ORDER — HALOPERIDOL LACTATE 5 MG/ML IJ SOLN: 4.0000 mg | Freq: Four times a day (QID) | INTRAMUSCULAR | 0 refills | Status: DC | PRN

## 2022-01-29 NOTE — Progress Notes (Signed)
Location:   [facility] Room Number: 107 Place of Service:  SNF ([phone]) Provider:  Royal Hawthorn, NP  Crist Infante, MD  Patient Care Team: Crist Infante, MD as PCP - General (Internal Medicine)  Extended Emergency Contact Information Primary Emergency Contact: Denzil Hughes Address: [phone] [place] [address]          [place],  [place] Phone: [phone] Mobile Phone: [phone] Relation: Spouse Secondary Emergency Contact: [place] Mobile Phone: [phone] Relation: Daughter  Code Status:  DNR Goals of care: Advanced Directive information    01/29/2022    2:18 PM  Advanced Directives  Does Patient Have a Medical Advance Directive? Yes  Type of Paramedic of [place];Living will;Out of facility DNR (pink MOST or yellow form)  Does patient want to make changes to medical advance directive? No - Patient declined  Copy of [place] in Chart? Yes - validated most recent copy scanned in chart (See row information)  Pre-existing out of facility DNR order (yellow form or pink MOST form) Pink MOST form placed in chart (order not valid for inpatient use);Yellow form placed in chart (order not valid for inpatient use)     Chief Complaint  Patient presents with   Acute Visit    Agitation    HPI:  Pt is a 88 y.o. male seen today for an acute visit for agitation.   He currently resides on the skilled nursing unit at [facility] PMH: PE, OSA, HLD,T2DM, post polio syndrome, prostate cancer s/p prostatectomy 1995, PSA recurrence with adjuvant radiation 2005, renal stones 2016, and cognitive impairment.      01/06/2022 he underwent cystoscopy with left retrograde pyelogram and ureteroscopy with stent placement. He was discharged to [facility] and became agitated and delirious. He had two strings at the penis he was pulling on during that time. He was sent back to the ER 11/23. He was  hypothermic and diagnosis with sepsis from UTI and acute metabolic encephalopathy.  He has a hx of underlying cognitive impairment but since his hospitalization he is much worse with agitation. He is not ambulatory, incontinent and requires assistance for all ADLs. He is followed by hospice.   He was placed on haldol '1mg'$  tid due to agitation with behaviors such as yelling, spitting, and hitting staff members. He continued to be agitated today and the nurse gave ativan 2 mg IM with some improvement. He remained able to respond after the dose. Pharmacy has notified us that IM ativan is on backorder and to select another agent. Nurses are having trouble getting him to swallow the meds.  He is not eating and drinking well and does have some decreased urine output due to this but is voiding and has blood in his urine. UA C and S done 01/27/22 which showed 3+ blood but no bacterial growth. On Eliquis.     Past Medical History:  Diagnosis Date   Adenomatous colon polyp    Allergic rhinitis    Arthritis    CHF (congestive heart failure) (HCC)    Chronic kidney disease    GERD (gastroesophageal reflux disease)    Hyperlipidemia    Hypertension    Polio 02/16/1937   Post-polio syndrome    Prostate cancer ([place])    Saddle pulmonary embolus (HCC)    Skin cancer (melanoma) ([place])    Right eye   Past Surgical History:  Procedure Laterality Date   cataract surgery     bilateral  CYSTOSCOPY WITH RETROGRADE PYELOGRAM, URETEROSCOPY AND STENT PLACEMENT Left 01/06/2022   Procedure: CYSTOSCOPY WITH LEFT RETROGRADE PYELOGRAM, URETEROSCOPY WITH  HOLMIUM LASER  AND STENT PLACEMENT;  Surgeon: Irine Seal, MD;  Location: [facility];  Service: Urology;  Laterality: Left;  1 HR FOR CASE   LUMBAR EPIDURAL INJECTION     LUMBAR LAMINECTOMY/DECOMPRESSION MICRODISCECTOMY Right 07/19/2013   Procedure: LUMBAR LAMINECTOMY/DECOMPRESSION MICRODISCECTOMY 2 LEVELS     lumbar  three/four,  four/five;  Surgeon: Eustace Moore, MD;   Location: [facility];  Service: Neurosurgery;  Laterality: Right;   PROSTATECTOMY     sigmoid colectomy     TONSILLECTOMY      Allergies  Allergen Reactions   Gabapentin Other (See Comments)    Hallucinations   Norco [Hydrocodone-Acetaminophen] Other (See Comments)    Family feels this causes "cognitive impairment"   Potassium-Containing Compounds Hives, Diarrhea and Nausea And Vomiting   Benzodiazepines Itching, Swelling and Rash   Penicillins Hives, Swelling and Rash    Allergies as of 01/29/2022       Reactions   Gabapentin Other (See Comments)   Hallucinations   Norco [hydrocodone-acetaminophen] Other (See Comments)   Family feels this causes "cognitive impairment"   Potassium-containing Compounds Hives, Diarrhea, Nausea And Vomiting   Benzodiazepines Itching, Swelling, Rash   Penicillins Hives, Swelling, Rash        Medication List        Accurate as of January 29, 2022  2:57 PM. If you have any questions, ask your nurse or doctor.          acetaminophen 500 MG tablet Commonly known as: TYLENOL Take 1,000 mg by mouth at bedtime.   acetaminophen 500 MG tablet Commonly known as: TYLENOL Take 1,000 mg by mouth as needed for moderate pain (TID prn for pain).   bisacodyl 10 MG suppository Commonly known as: DULCOLAX Place 10 mg rectally as needed for moderate constipation.   Eliquis 5 MG Tabs tablet Generic drug: apixaban Take 5 mg by mouth 2 (two) times daily.   haloperidol 1 MG tablet Commonly known as: HALDOL Take 1 mg by mouth 3 (three) times daily.   LORazepam 1 MG tablet Commonly known as: ATIVAN Take 1 mg by mouth every 4 (four) hours as needed for anxiety.   Prolia 60 MG/ML Sosy injection Generic drug: denosumab Inject 60 mg into the skin every 6 (six) months.   Valtrex 1000 MG tablet Generic drug: valACYclovir Take 1,000 mg by mouth daily as needed (as directed for the onset of a fever blister).        Review of Systems   Unable to perform ROS: Dementia    Immunization History  Administered Date(s) Administered   Influenza Split 12/03/2009, 11/25/2010, 03/03/2011, 11/10/2011, 12/09/2012, 11/17/2013   Influenza, Quadrivalent, Recombinant, Inj, Pf 10/30/2017, 11/04/2018, 11/29/2019, 12/07/2020   Influenza,inj,Quad PF,6+ Mos 11/17/2013, 11/03/2014   Influenza-Unspecified 11/02/2015, 11/28/2016   Moderna Covid-19 Vaccine Bivalent Booster 10yr & up 11/14/2020   Moderna SARS-COV2 Booster Vaccination 07/16/2020, 12/26/2021   Pneumococcal Polysaccharide-23 03/03/2011   Pneumococcal-Unspecified 05/10/2012   Td 03/03/2011   Tdap 01/19/2013, 02/13/2019   Zoster Recombinat (Shingrix) 04/10/2018   Zoster, Live 12/03/2009, 03/03/2011, 10/05/2017   Pertinent  Health Maintenance Due  Topic Date Due   FOOT EXAM  Never done   OPHTHALMOLOGY EXAM  Never done   INFLUENZA VACCINE  05/17/2022 (Originally 09/16/2021)   HEMOGLOBIN A1C  07/01/2022      01/12/2022    7:15 PM 01/13/2022  1:00 AM 01/13/2022    8:00 AM 01/14/2022   10:12 AM 01/19/2022    9:32 AM  Fall Risk  Falls in the past year?    0 0  Was there an injury with Fall?    0 0  Fall Risk Category Calculator    0 0  Fall Risk Category    Low Low  Patient Fall Risk Level High fall risk High fall risk High fall risk High fall risk High fall risk  Patient at Risk for Falls Due to    History of fall(s) History of fall(s)  Fall risk Follow up    Falls evaluation completed Falls evaluation completed   Functional Status Survey:    Vitals:   01/29/22 1419  BP: 136/85  Pulse: 66  Resp: (!) 22  Temp: (!) 97.2 F (36.2 C)  SpO2: 97%  Weight: 170 lb (77.1 kg)  Height: '5\' 11"'$  (1.803 m)   Body mass index is 23.71 kg/m. Physical Exam Vitals reviewed.  Constitutional:      General: He is not in acute distress.    Appearance: He is not diaphoretic.  HENT:     Head: Normocephalic and atraumatic.  Neck:     Thyroid: No thyromegaly.     Vascular: No  JVD.     Trachea: No tracheal deviation.  Cardiovascular:     Rate and Rhythm: Normal rate and regular rhythm.     Heart sounds: No murmur heard. Pulmonary:     Effort: Pulmonary effort is normal. No respiratory distress.     Breath sounds: Normal breath sounds. No wheezing.  Abdominal:     General: Bowel sounds are normal. There is no distension.     Palpations: Abdomen is soft.     Tenderness: There is no abdominal tenderness.  Lymphadenopathy:     Cervical: No cervical adenopathy.  Skin:    General: Skin is warm and dry.  Neurological:     Mental Status: He is alert.     Cranial Nerves: No cranial nerve deficit.     Comments: Not verbal, tacks with eyes, not able to f/c.  No obvious focal deficit.      Labs reviewed: Recent Labs    01/09/22 0235 01/10/22 0223 01/10/22 2158 01/11/22 0558  NA 143 142  --  141  K 3.9 3.4*  --  3.5  CL 111 111  --  105  CO2 23 23  --  22  GLUCOSE 84 148*  --  139*  BUN 20 20  --  17  CREATININE 1.12 0.86  --  0.86  CALCIUM 8.2* 8.2*  --  9.0  MG  --   --  1.6*  --    Recent Labs    08/16/21 1443 01/08/22 0700 01/09/22 0235  AST '20 23 19  '$ ALT '24 30 24  '$ ALKPHOS 62 65 46  BILITOT 0.7 0.7 0.5  PROT 6.4* 7.4 5.6*  ALBUMIN 3.5 4.0 3.0*   Recent Labs    06/23/21 0255 06/23/21 0326 08/16/21 1443 12/31/21 1043 01/08/22 0700 01/09/22 0235 01/10/22 0223 01/11/22 0558  WBC 11.1*   < > 7.0   < > 5.9 5.7 5.3 7.7  NEUTROABS 8.4*  --  5.2  --  4.4  --   --   --   HGB 14.0   < > 13.1   < > 13.6 10.9* 10.7* 12.9*  HCT 40.1   < > 38.5*   < > 40.8 34.4* 32.2* 38.5*  MCV 90.1   < > 92.3   < > 90.1 95.0 93.9 90.2  PLT 213   < > 222   < > 184 163 145* 183   < > = values in this interval not displayed.   Lab Results  Component Value Date   TSH 3.123 01/08/2022   Lab Results  Component Value Date   HGBA1C 8.2 (H) 12/31/2021   No results found for: "CHOL", "HDL", "LDLCALC", "LDLDIRECT", "TRIG", "CHOLHDL"  Significant Diagnostic  Results in last 30 days:  CT Renal Stone Study  Result Date: 01/08/2022 CLINICAL DATA:  Abdominal/flank pain, stone suspected. EXAM: CT ABDOMEN AND PELVIS WITHOUT CONTRAST TECHNIQUE: Multidetector CT imaging of the abdomen and pelvis was performed following the standard protocol without IV contrast. RADIATION DOSE REDUCTION: This exam was performed according to the departmental dose-optimization program which includes automated exposure control, adjustment of the mA and/or kV according to patient size and/or use of iterative reconstruction technique. COMPARISON:  CT 12/22/2021 and intraoperative fluoroscopy 01/06/2022 FINDINGS: Lower chest: Again noted are small cysts or blebs along the medial right lower lobe. Patchy densities at lung bases are suggestive for atelectasis. No large pleural effusions. Hepatobiliary: Normal appearance of the liver and gallbladder. Pancreas: Unremarkable. No pancreatic ductal dilatation or surrounding inflammatory changes. Spleen: Normal in size without focal abnormality. Adrenals/Urinary Tract: Normal adrenal glands. Stones in the right kidney lower pole, largest measuring 8 mm. Negative for right hydronephrosis. Negative for right ureter stones. Left ureter stent has been placed but the ureter stent has retracted. Proximal aspect of the stent is in the distal left ureter and this stent is extending out through the urethra. Stent appears to be extending out of the penis based on the topogram image. Left hydronephrosis has markedly decompressed. Evidence for parapelvic cysts or residual dilated calices in left kidney upper pole. Question tiny stones in left kidney lower pole. Distal left ureter stones have been removed. Urinary bladder is decompressed with gas within the bladder. Gas is likely related to recent instrumentation. Stomach/Bowel: Again noted is colon anterior to the liver. Normal appendix without inflammatory changes. No evidence for bowel obstruction and no focal  bowel inflammation. Normal appearance of the stomach. Again noted is gas-filled structure near the distal esophagus that measures up to 4.2 cm and likely represents a large distal esophageal diverticulum. Vascular/Lymphatic: Again noted is a prominent mesenteric lymph node measuring up to 1.6 cm on image 38/2. Overall, there is not significant lymph node enlargement in the abdomen or pelvis. Atherosclerotic calcifications in the abdominal aorta without aneurysm. Reproductive: Prostatectomy. Surgical clips in the pelvis and inguinal regions bilaterally. Other: Negative for free fluid.  Negative for free air. Musculoskeletal: Degenerative changes in both hips appear chronic. Multilevel disc disease in the lumbar spine with retrolisthesis at L3-L4. Facet arthropathy in the lumbar spine. No acute bone abnormality. Calcifications or postsurgical changes along the anterior abdominal wall. IMPRESSION: 1. Interval removal of the distal left ureter stones and placement of a left ureter stent. The left ureter stent is partially dislodged with only a small portion remaining in the distal left ureter. 2. Decreased left hydroureteronephrosis. Residual dilated calices in left upper pole and mild dilatation of the left ureter. 3. Nonobstructive right renal calculi. Probable tiny left renal calculi. 4. Prominent distal esophageal diverticulum without acute inflammatory changes. 5. Prostatectomy. Electronically Signed   By: Markus Daft M.D.   On: 01/08/2022 09:19   CT Head Wo Contrast  Result Date: 01/08/2022 CLINICAL DATA:  Altered mental  status. Patient screaming and yelling at nursing home. EXAM: CT HEAD WITHOUT CONTRAST TECHNIQUE: Contiguous axial images were obtained from the base of the skull through the vertex without intravenous contrast. RADIATION DOSE REDUCTION: This exam was performed according to the departmental dose-optimization program which includes automated exposure control, adjustment of the mA and/or kV  according to patient size and/or use of iterative reconstruction technique. COMPARISON:  CT head without contrast 06/23/2021 FINDINGS: Brain: Mild atrophy and moderate white matter changes are stable. No acute infarct, hemorrhage, or mass lesion is present. Deep brain nuclei are within normal limits. The ventricles are of normal size. No significant extraaxial fluid collection is present. The brainstem and cerebellum are within normal limits. Vascular: No hyperdense vessel or unexpected calcification. Skull: Calvarium is intact. No focal lytic or blastic lesions are present. No significant extracranial soft tissue lesion is present. Sinuses/Orbits: The paranasal sinuses and mastoid air cells are clear. Bilateral lens replacements are noted. Globes and orbits are otherwise unremarkable. IMPRESSION: 1. No acute intracranial abnormality or significant interval change. 2. Stable atrophy and moderate white matter disease. This likely reflects the sequela of chronic microvascular ischemia. Electronically Signed   By: San Morelle M.D.   On: 01/08/2022 08:05   DG Chest Port 1 View  Result Date: 01/08/2022 CLINICAL DATA:  Altered mental status. EXAM: PORTABLE CHEST 1 VIEW COMPARISON:  One-view chest x-ray 08/16/2021 FINDINGS: Heart size is exaggerated by low lung volumes. Atherosclerotic calcifications are present at the aortic arch. Lungs are clear. No edema or effusion is present. Degenerative changes are again noted in the shoulders. IMPRESSION: 1. Low lung volumes. 2. No acute cardiopulmonary disease. Electronically Signed   By: San Morelle M.D.   On: 01/08/2022 08:00   DG C-Arm 1-60 Min-No Report  Result Date: 01/06/2022 Fluoroscopy was utilized by the requesting physician.  No radiographic interpretation.   DG C-Arm 1-60 Min-No Report  Result Date: 01/06/2022 Fluoroscopy was utilized by the requesting physician.  No radiographic interpretation.    Assessment/Plan  1. Acute  encephalopathy With recent hospitalization due urethral obstruction, UTI and sepsis.  Continues with agitation Hospice ordered IM Haldol prn Will give oral haldol time to work the difficulty has been getting him to swallow oral meds.  D/C IM ativan due to back order.   2. Cognitive impairment Much worse since hospitalization and has not improved On hospice care.   3. Post-polio syndrome Not ambulatory   4. Urinary retention No current issues  5. Poor fluid intake Due to #1  6. Urethral obstruction He pulled out the stents.  No further work up per urology on hospice    Labs/tests ordered:  NA

## 2022-02-16 DEATH — deceased

## 2022-06-29 ENCOUNTER — Ambulatory Visit: Payer: Medicare PPO | Admitting: Family Medicine

## 2023-08-30 IMAGING — CT CT HEAD W/O CM
4 series · 16 of 47 positions shown, 18 images · non-contrast
Comparison: None Available.

CLINICAL DATA: Psychosis.



[Series 2: head wo · axial · 0.44mm/px · z∈[-140,-25]mm · 7 of 31 slices shown, 9 images]
[im 4/31  brain]
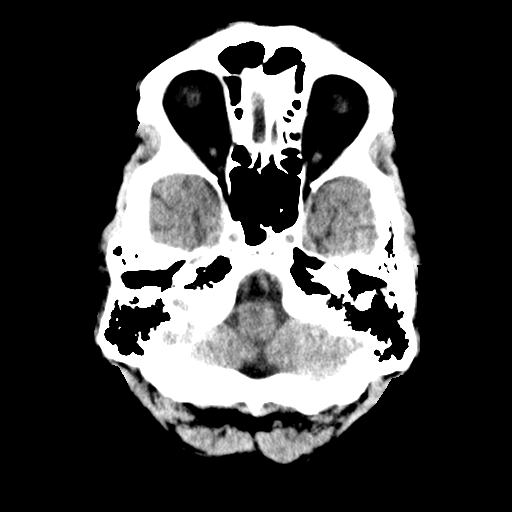
[im 4/31  bone]
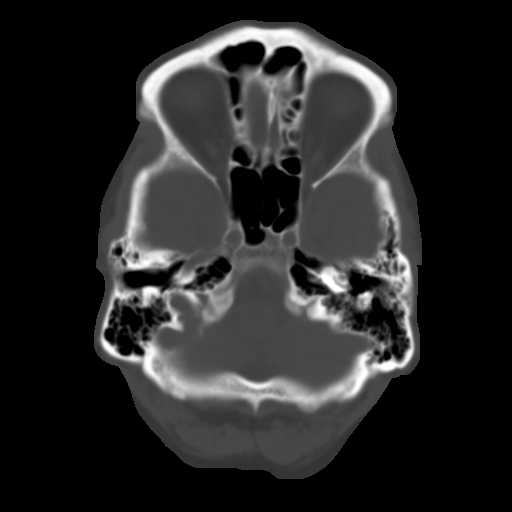
[im 8/31  brain]
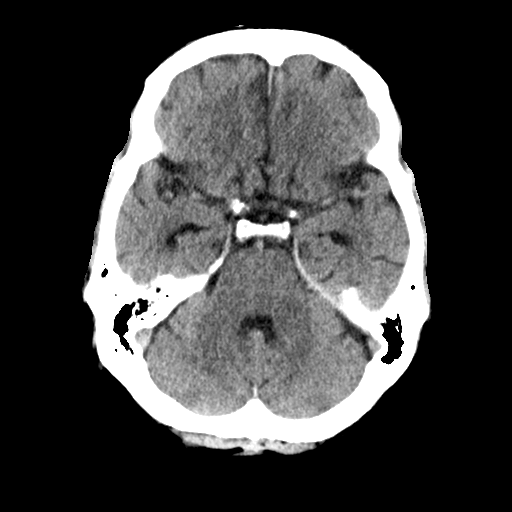
[im 12/31  brain]
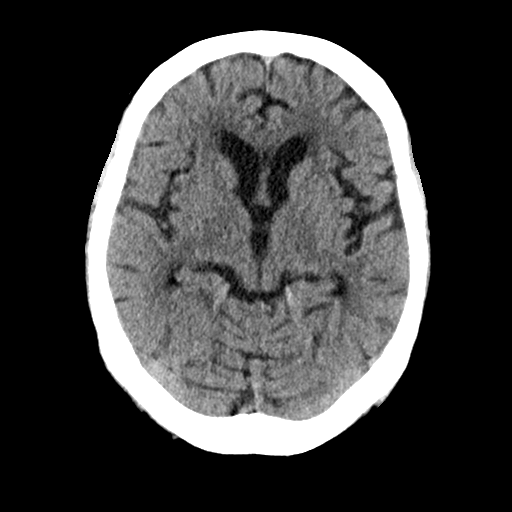
[im 16/31  brain]
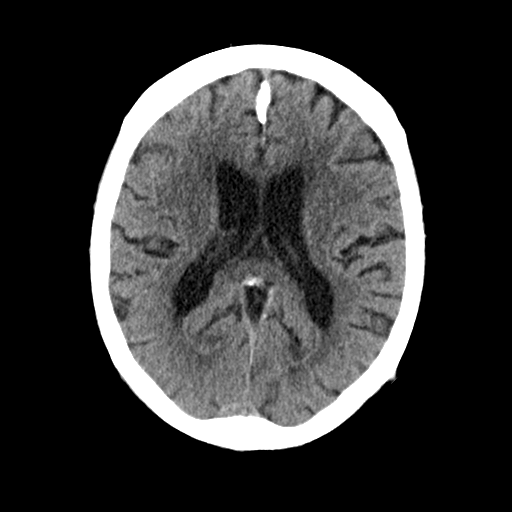
[im 19/31  brain]
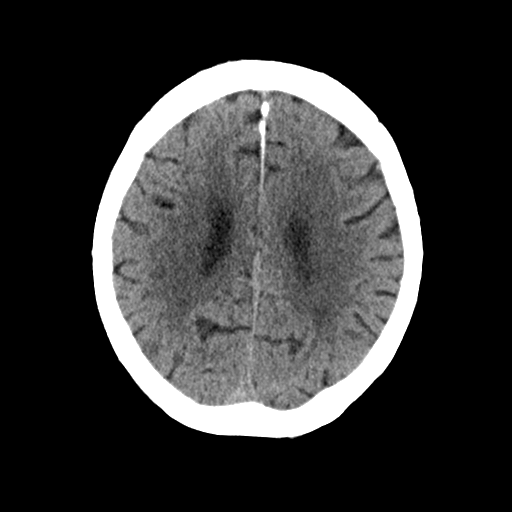
[im 19/31  bone]
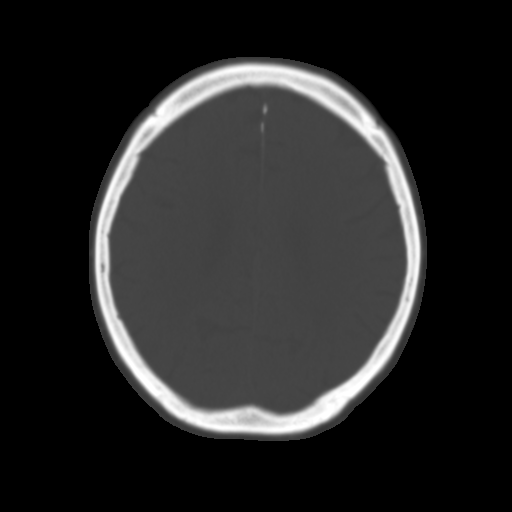
[im 23/31  brain]
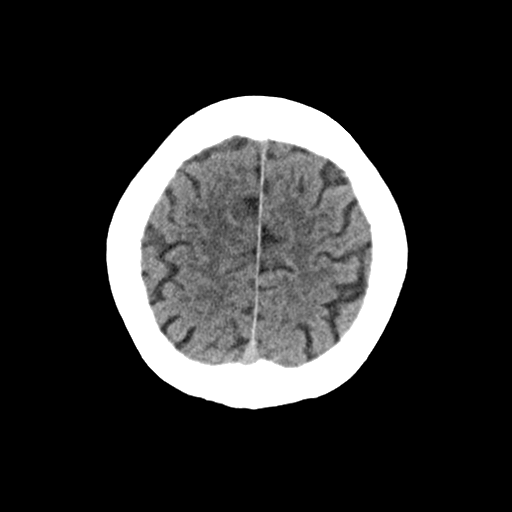
[im 27/31  brain]
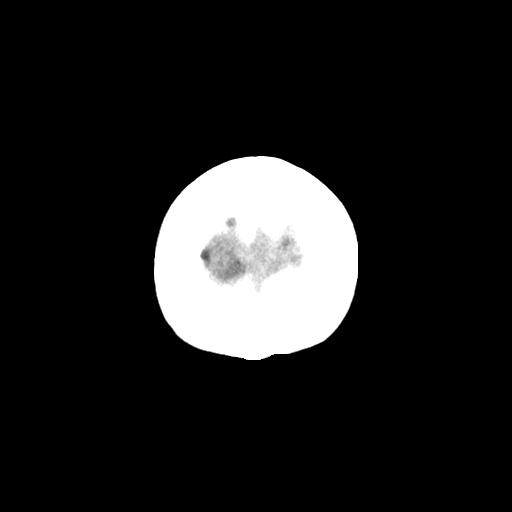

[Series 3: head bone · axial · 0.44mm/px · z∈[-141,-111]mm · 3 of 77 slices shown]
[im 8/77  bone]
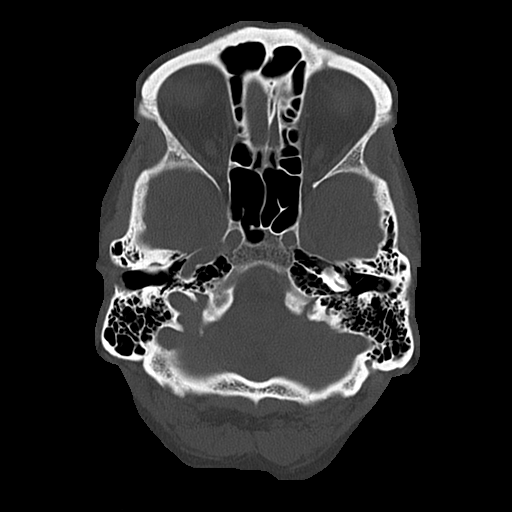
[im 16/77  bone]
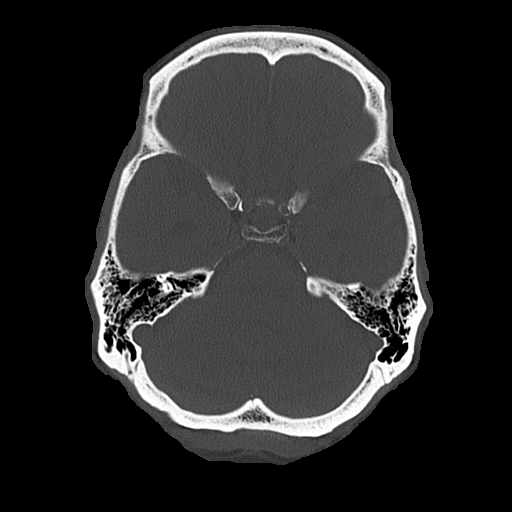
[im 23/77  bone]
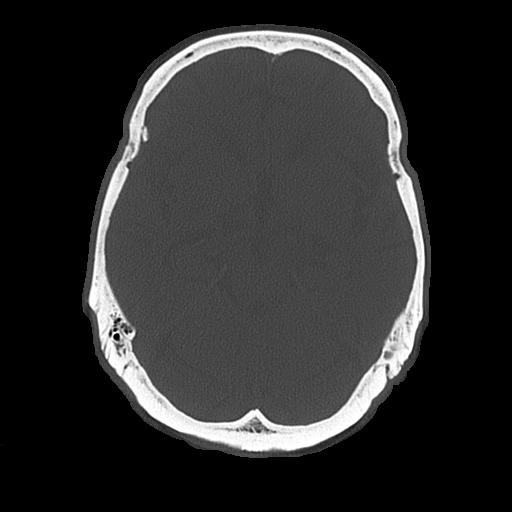

[Series 4: coronal soft · coronal · 0.31mm/px · 3 of 68 slices shown]
[im 23/68  brain]
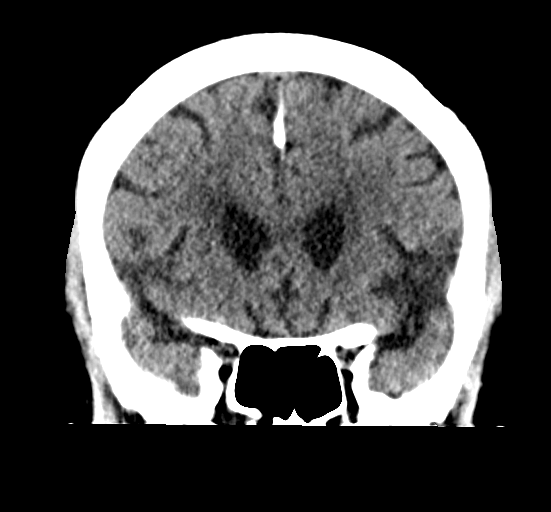
[im 30/68  brain]
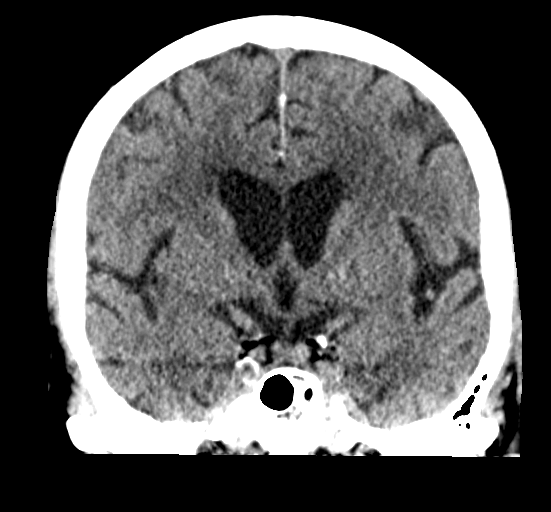
[im 38/68  brain]
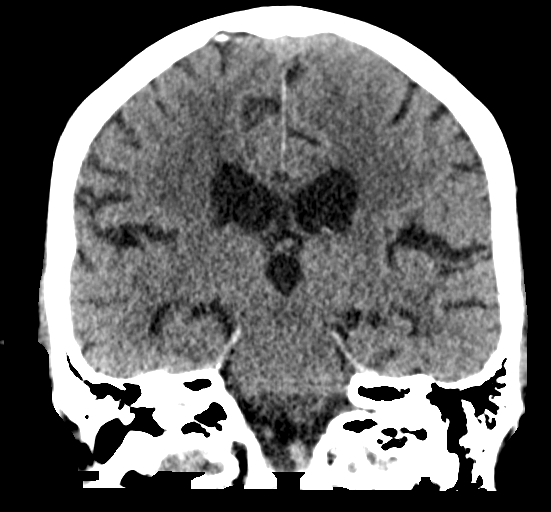

[Series 5: sagittal soft · sagittal · 0.31mm/px · 3 of 58 slices shown]
[im 20/58  brain]
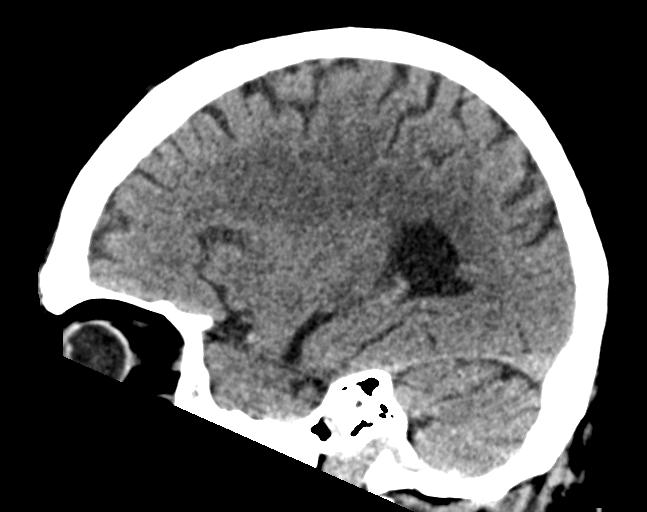
[im 29/58  brain]
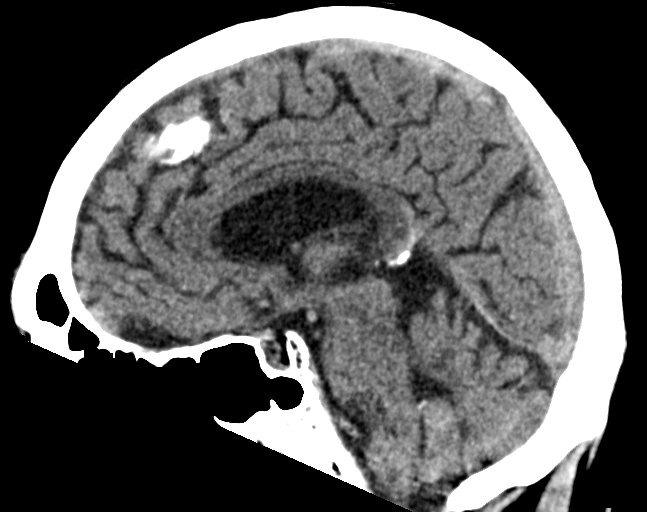
[im 39/58  brain]
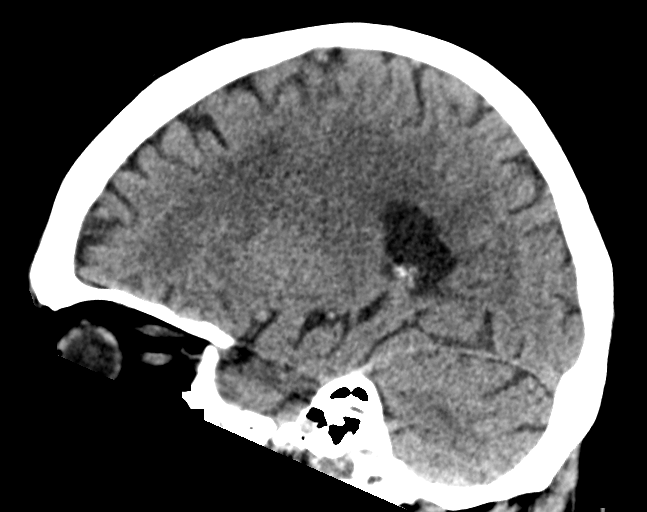

[16 of 47 positions shown; findings below may reference images not displayed]

FINDINGS: Brain: Normal cerebral/cerebellar volume for age. Mild for age low
density in the periventricular white matter likely related to small
vessel disease. No mass lesion, hemorrhage, hydrocephalus, acute
infarct, intra-axial, or extra-axial fluid collection.

Vascular: No hyperdense vessel or unexpected calcification.

Skull: No skull fracture.

Sinuses/Orbits: Normal imaged portions of the orbits and globes.
Clear paranasal sinuses and mastoid air cells. Cerumen in the left
external ear canal.

Other: None.
IMPRESSION: No acute intracranial abnormality.

Relatively mild for age cerebral/cerebellar atrophy and
periventricular white matter small vessel ischemic change.

## 2023-09-02 IMAGING — CT CT HEAD W/O CM
4 series · 17 of 47 positions shown, 19 images · non-contrast
Comparison: 06/20/2021

CLINICAL DATA: Fall.  Head trauma, minor (Age >= 65y) Fall



[Series 3: head wo · axial · 0.42mm/px · z∈[-116,+4]mm · 7 of 33 slices shown, 9 images]
[im 5/33  brain]
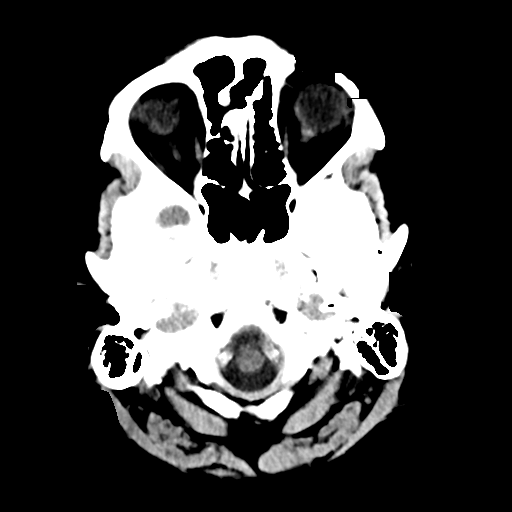
[im 5/33  bone]
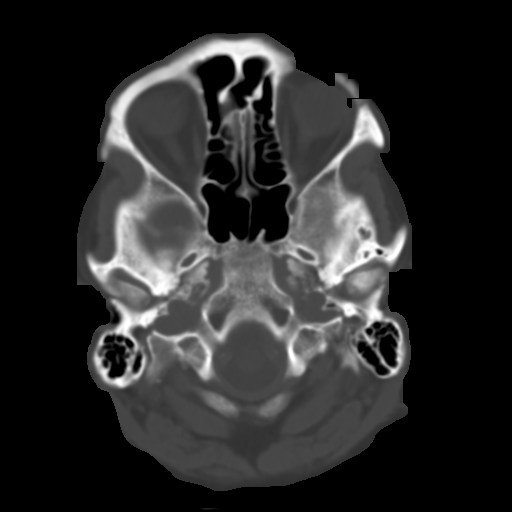
[im 9/33  brain]
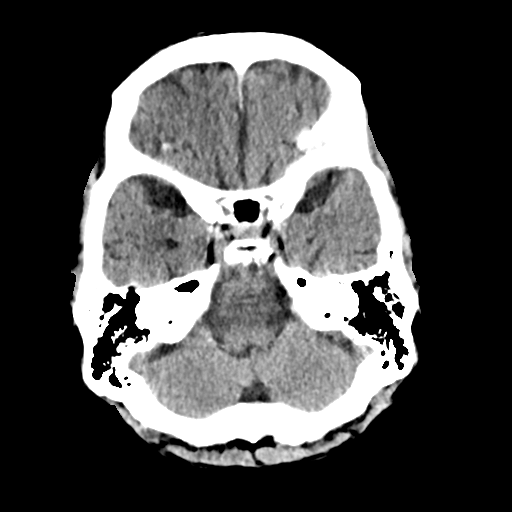
[im 13/33  brain]
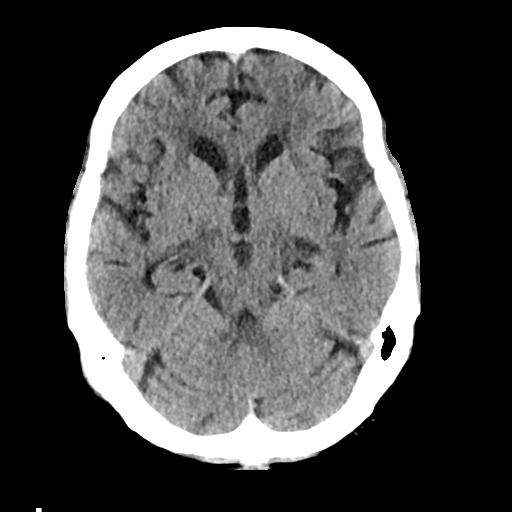
[im 17/33  brain]
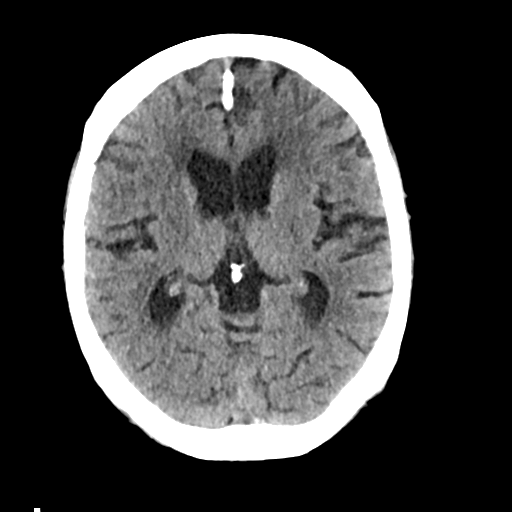
[im 21/33  brain]
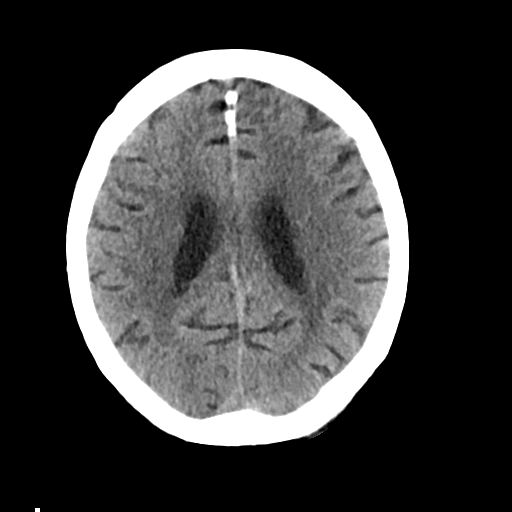
[im 21/33  bone]
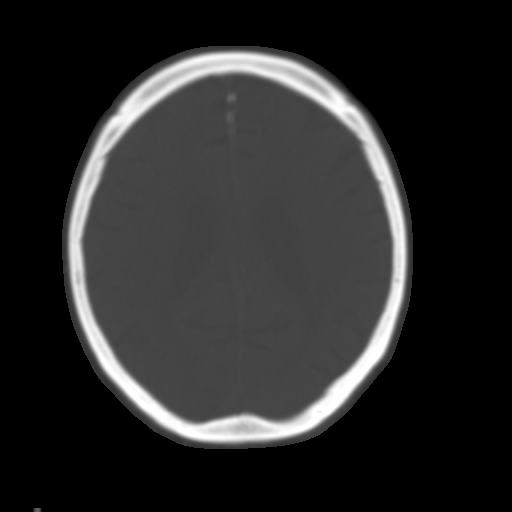
[im 25/33  brain]
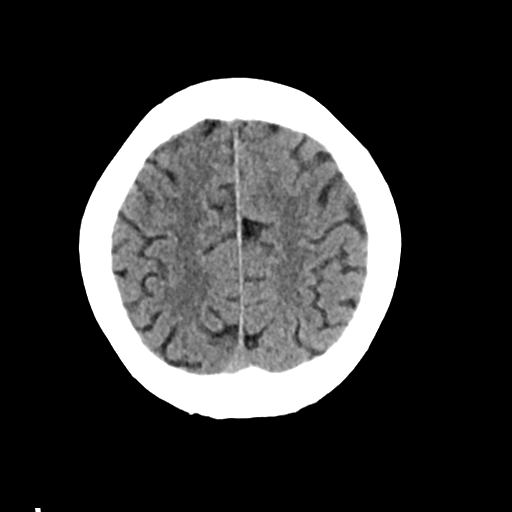
[im 29/33  brain]
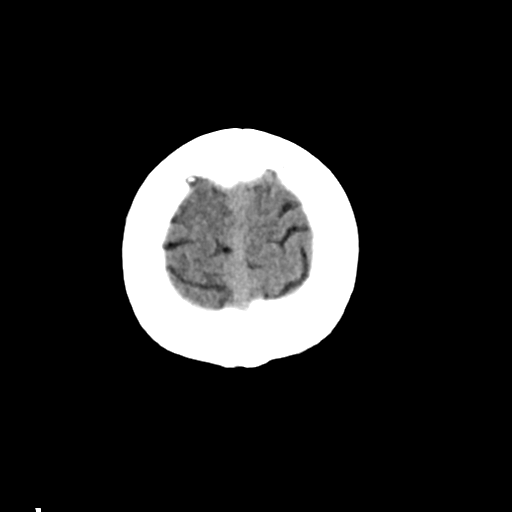

[Series 4: head bone · axial · 0.42mm/px · z∈[-122,-66]mm · 4 of 83 slices shown]
[im 9/83  bone]
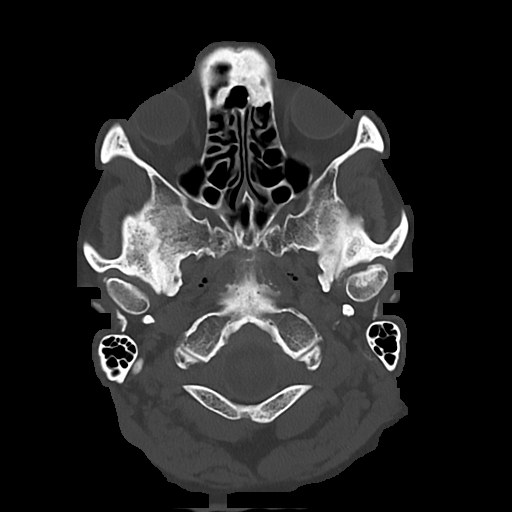
[im 17/83  bone]
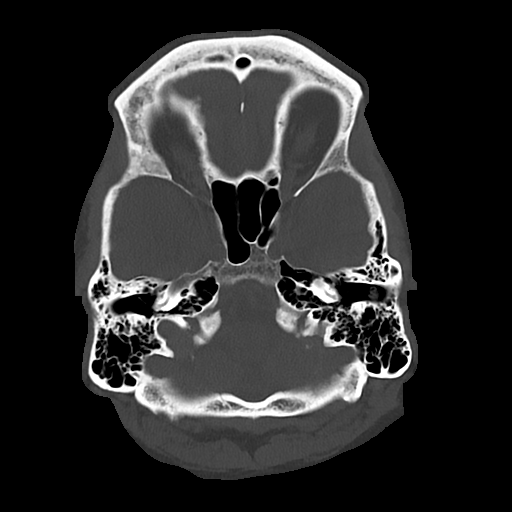
[im 25/83  bone]
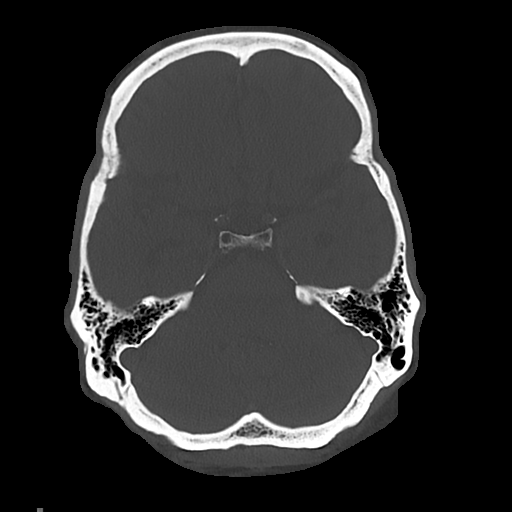
[im 37/83  bone]
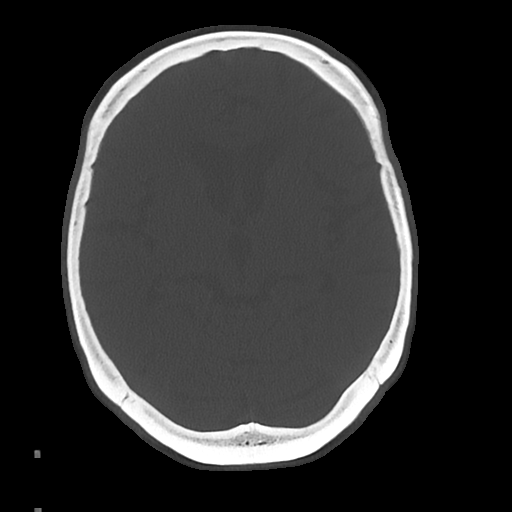

[Series 5: cor soft · coronal · 0.43mm/px · 3 of 72 slices shown]
[im 24/72  brain]
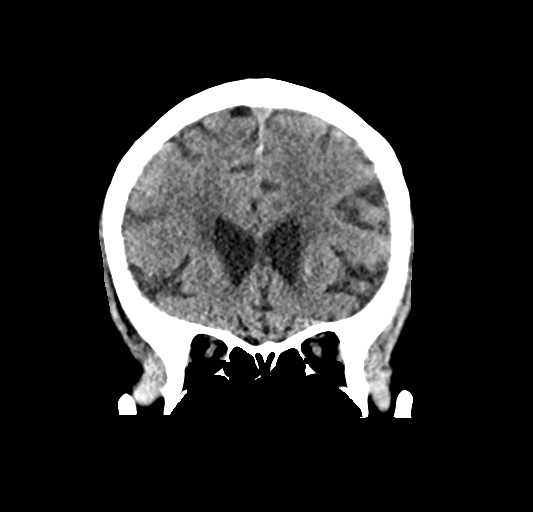
[im 32/72  brain]
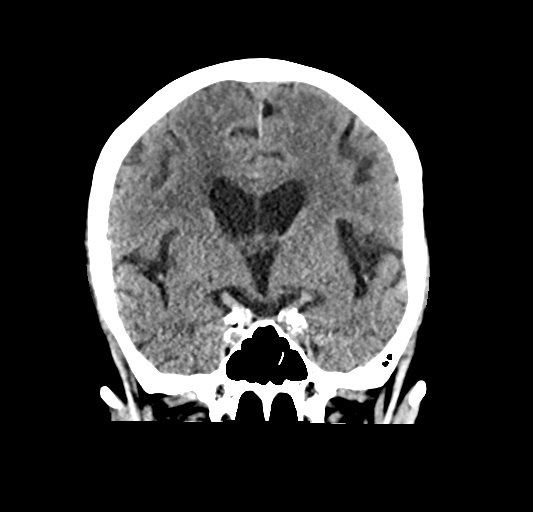
[im 40/72  brain]
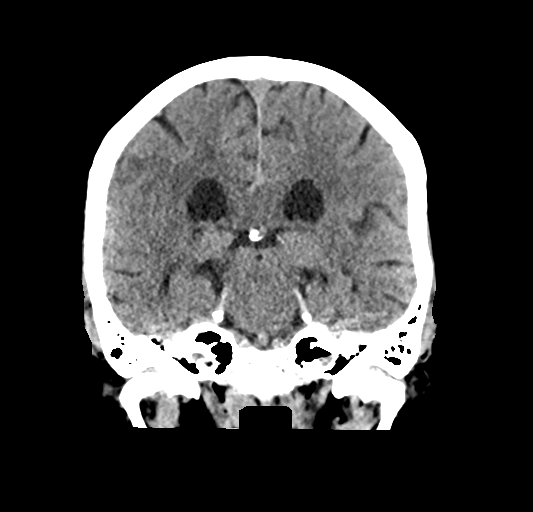

[Series 6: sag soft · sagittal · 0.39mm/px · 3 of 61 slices shown]
[im 21/61  brain]
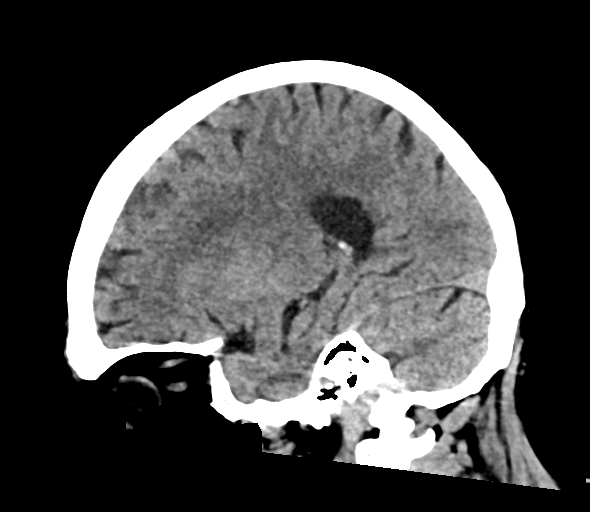
[im 31/61  brain]
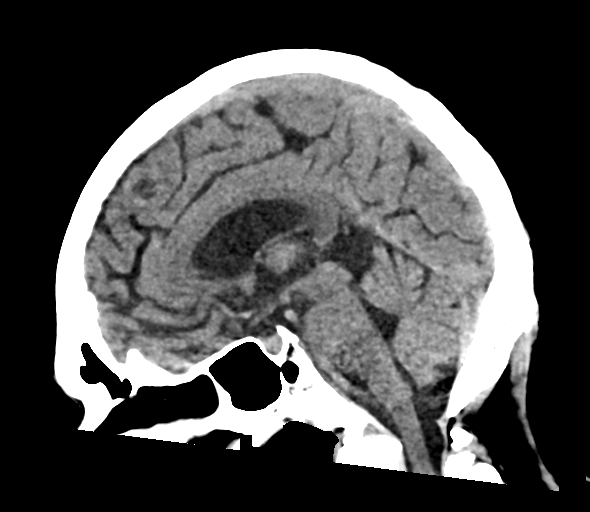
[im 41/61  brain]
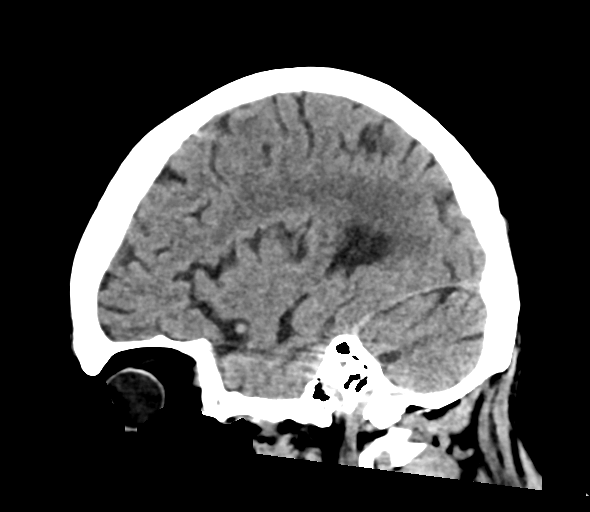

[17 of 47 positions shown; findings below may reference images not displayed]

FINDINGS: Brain: No acute intracranial abnormality. Specifically, no
hemorrhage, hydrocephalus, mass lesion, acute infarction, or
significant intracranial injury.

Vascular: No hyperdense vessel or unexpected calcification.

Skull: No acute calvarial abnormality.

Sinuses/Orbits: No acute findings

Other: None
IMPRESSION: No acute intracranial abnormality.

## 2023-09-02 IMAGING — DX DG SHOULDER 2+V*L*
2 series · 2 of 2 positions shown · non-contrast
Comparison: PA Lat chest 07/17/2013.  No prior shoulder series.

CLINICAL DATA: Fall with shoulder pain.

EXAM:
LEFT SHOULDER - 2+ VIEW; PORTABLE CHEST - 1 VIEW

[shoulder ap]
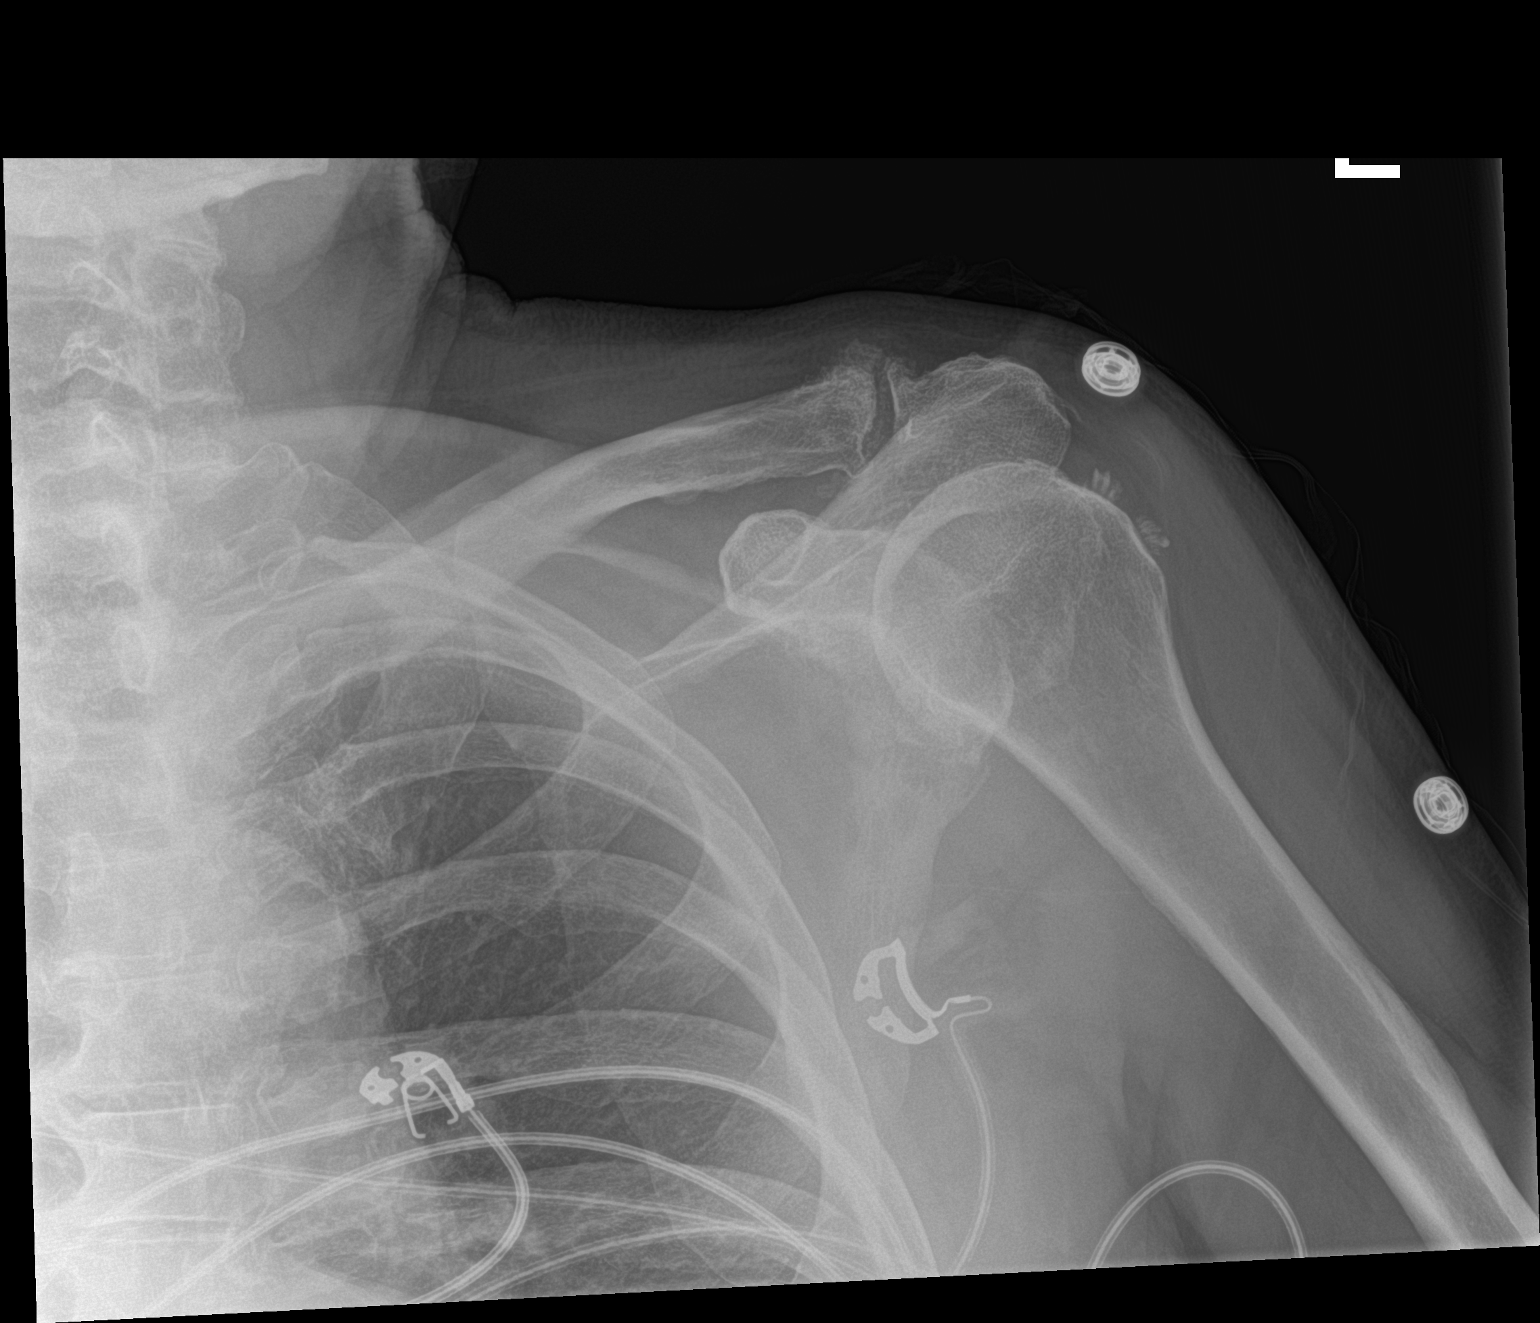

[shoulder obl]
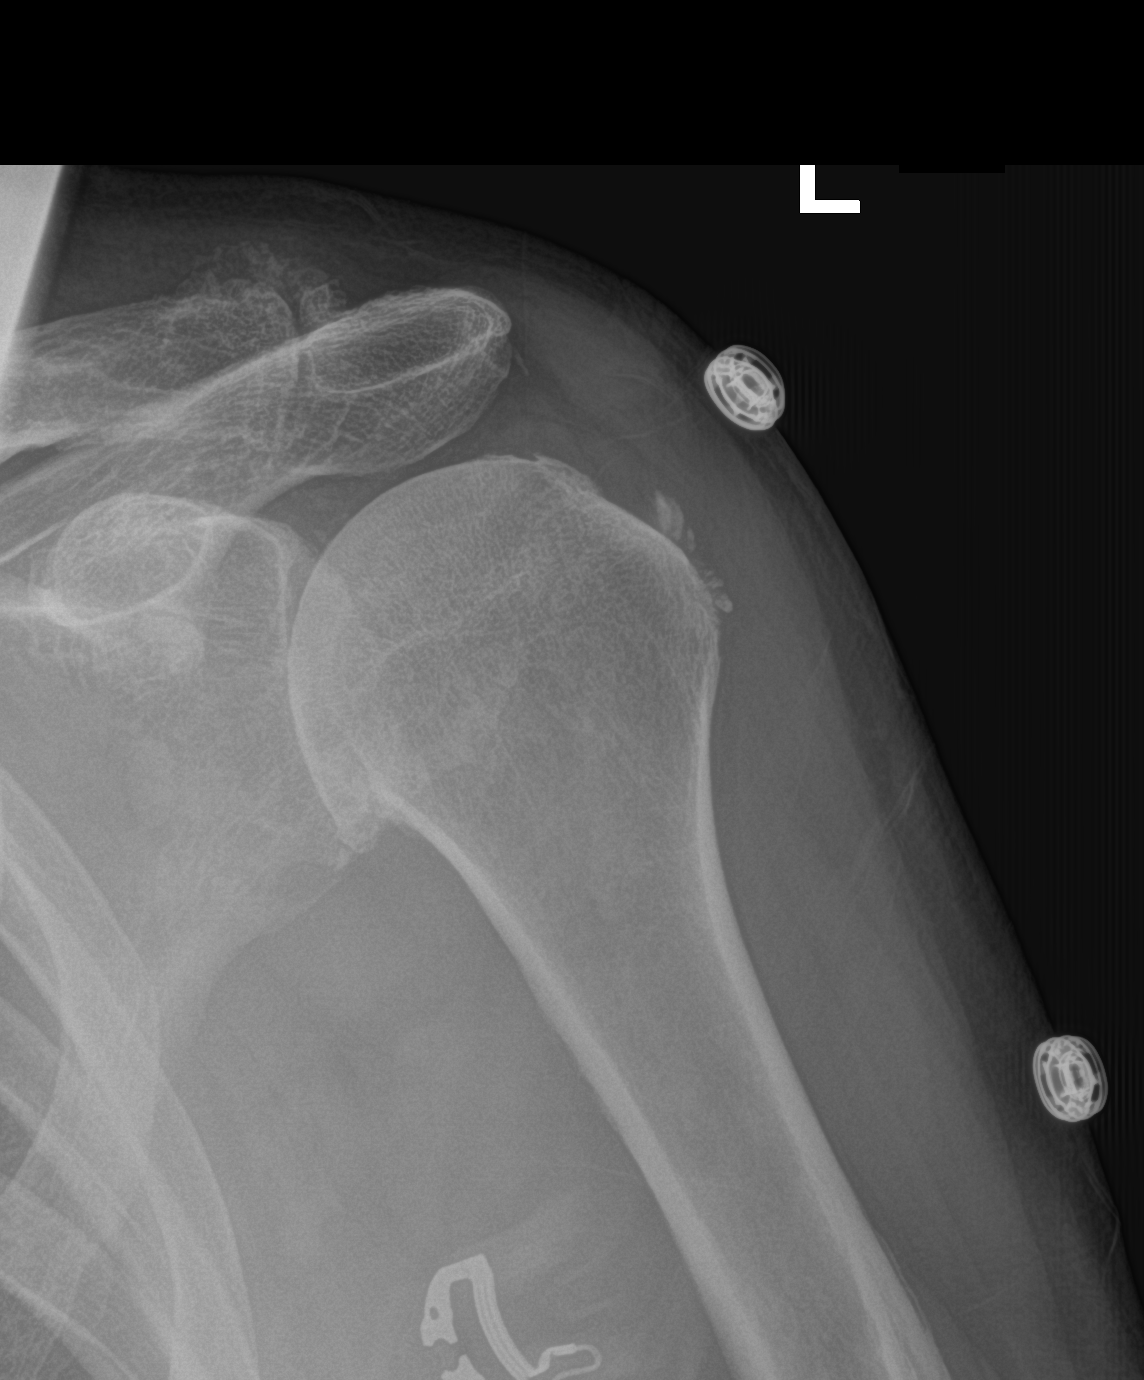

[2 of 2 positions shown; findings below may reference images not displayed]

FINDINGS: Chest:

The lungs are expiratory. There are opacities in the base of the
lungs which could be atelectasis or consolidation. No pleural
effusion is seen.

There is no pneumothorax. There are numerous overlying monitor
wires. The lungs are otherwise clear.

The cardiac size is normal. There is aortic uncoiling, stable
mediastinum accounting for exploration. Aortic atherosclerosis.

Thoracic cage is grossly intact. Mild osteopenia. Thoracic
spondylosis.

Left shoulder:

There is no two-view evidence of fractures. There is mild
osteopenia. There is moderate osteophytosis at the glenohumeral
joint and AC joint. Severe joint space loss is noted at the
glenohumeral joint.

There are calcifications adjacent the posterior humeral cuff
insertion which could be due to calcific tendinitis or tendinopathy.

Surrounding soft tissues are otherwise unremarkable. Left upper lung
field is clear.
IMPRESSION: 1. Expiratory chest film with bibasilar atelectasis or
consolidation.
2. Osteopenia and degenerative change without evidence of fractures
at the left shoulder.
3. Calcifications of the posterior edge of the humeral head which
could be due to calcific infraspinatus/teres minor tendinitis or
chronic calcific tendinopathy.
